# Patient Record
Sex: Female | Born: 1940 | Race: Black or African American | Hispanic: No | Marital: Married | Smoking: Never smoker
Health system: Southern US, Community
[De-identification: ages and names within clinical notes are randomized; demographics above are authoritative.]

## PROBLEM LIST (undated history)

## (undated) DIAGNOSIS — G510 Bell's palsy: Secondary | ICD-10-CM

## (undated) DIAGNOSIS — R7303 Prediabetes: Secondary | ICD-10-CM

## (undated) DIAGNOSIS — F0390 Unspecified dementia without behavioral disturbance: Secondary | ICD-10-CM

## (undated) DIAGNOSIS — K5790 Diverticulosis of intestine, part unspecified, without perforation or abscess without bleeding: Secondary | ICD-10-CM

## (undated) DIAGNOSIS — R42 Dizziness and giddiness: Secondary | ICD-10-CM

## (undated) DIAGNOSIS — E7800 Pure hypercholesterolemia, unspecified: Secondary | ICD-10-CM

## (undated) DIAGNOSIS — R2681 Unsteadiness on feet: Secondary | ICD-10-CM

## (undated) DIAGNOSIS — M1990 Unspecified osteoarthritis, unspecified site: Secondary | ICD-10-CM

## (undated) DIAGNOSIS — C801 Malignant (primary) neoplasm, unspecified: Secondary | ICD-10-CM

## (undated) DIAGNOSIS — R413 Other amnesia: Secondary | ICD-10-CM

## (undated) HISTORY — DX: Pure hypercholesterolemia, unspecified: E78.00

## (undated) HISTORY — DX: Prediabetes: R73.03

## (undated) HISTORY — PX: ABDOMINAL HYSTERECTOMY: SHX81

## (undated) HISTORY — PX: MASTECTOMY: SHX3

## (undated) HISTORY — PX: BREAST SURGERY: SHX581

---

## 1998-05-07 ENCOUNTER — Other Ambulatory Visit: Admission: RE | Admit: 1998-05-07 | Discharge: 1998-05-07 | Payer: Self-pay | Admitting: Family Medicine

## 2000-08-13 ENCOUNTER — Ambulatory Visit (HOSPITAL_COMMUNITY): Admission: RE | Admit: 2000-08-13 | Discharge: 2000-08-13 | Payer: Self-pay | Admitting: Internal Medicine

## 2001-04-22 ENCOUNTER — Encounter (HOSPITAL_COMMUNITY): Admission: RE | Admit: 2001-04-22 | Discharge: 2001-05-22 | Payer: Self-pay | Admitting: Oncology

## 2001-04-22 ENCOUNTER — Encounter: Admission: RE | Admit: 2001-04-22 | Discharge: 2001-04-22 | Payer: Self-pay | Admitting: Oncology

## 2001-06-13 ENCOUNTER — Encounter: Payer: Self-pay | Admitting: Family Medicine

## 2001-06-13 ENCOUNTER — Ambulatory Visit (HOSPITAL_COMMUNITY): Admission: RE | Admit: 2001-06-13 | Discharge: 2001-06-13 | Payer: Self-pay | Admitting: Family Medicine

## 2002-04-12 ENCOUNTER — Other Ambulatory Visit: Admission: RE | Admit: 2002-04-12 | Discharge: 2002-04-12 | Payer: Self-pay | Admitting: Family Medicine

## 2002-04-21 ENCOUNTER — Encounter (HOSPITAL_COMMUNITY): Admission: RE | Admit: 2002-04-21 | Discharge: 2002-05-21 | Payer: Self-pay | Admitting: Oncology

## 2002-04-21 ENCOUNTER — Encounter: Admission: RE | Admit: 2002-04-21 | Discharge: 2002-04-21 | Payer: Self-pay | Admitting: Oncology

## 2002-06-19 ENCOUNTER — Encounter: Payer: Self-pay | Admitting: General Surgery

## 2002-06-19 ENCOUNTER — Ambulatory Visit (HOSPITAL_COMMUNITY): Admission: RE | Admit: 2002-06-19 | Discharge: 2002-06-19 | Payer: Self-pay | Admitting: General Surgery

## 2003-04-23 ENCOUNTER — Encounter (HOSPITAL_COMMUNITY): Admission: RE | Admit: 2003-04-23 | Discharge: 2003-05-23 | Payer: Self-pay | Admitting: Oncology

## 2003-04-23 ENCOUNTER — Encounter: Admission: RE | Admit: 2003-04-23 | Discharge: 2003-04-23 | Payer: Self-pay | Admitting: Oncology

## 2003-06-27 ENCOUNTER — Encounter (HOSPITAL_COMMUNITY): Admission: RE | Admit: 2003-06-27 | Discharge: 2003-07-27 | Payer: Self-pay | Admitting: Oncology

## 2003-06-27 ENCOUNTER — Encounter: Admission: RE | Admit: 2003-06-27 | Discharge: 2003-06-27 | Payer: Self-pay | Admitting: Oncology

## 2004-04-16 ENCOUNTER — Ambulatory Visit (HOSPITAL_COMMUNITY): Admission: RE | Admit: 2004-04-16 | Discharge: 2004-04-16 | Payer: Self-pay | Admitting: General Surgery

## 2004-04-22 ENCOUNTER — Encounter (HOSPITAL_COMMUNITY): Admission: RE | Admit: 2004-04-22 | Discharge: 2004-05-22 | Payer: Self-pay | Admitting: Oncology

## 2004-04-22 ENCOUNTER — Ambulatory Visit (HOSPITAL_COMMUNITY): Payer: Self-pay | Admitting: Oncology

## 2004-04-22 ENCOUNTER — Encounter: Admission: RE | Admit: 2004-04-22 | Discharge: 2004-04-22 | Payer: Self-pay | Admitting: Oncology

## 2004-11-30 ENCOUNTER — Emergency Department (HOSPITAL_COMMUNITY): Admission: EM | Admit: 2004-11-30 | Discharge: 2004-11-30 | Payer: Self-pay | Admitting: Emergency Medicine

## 2005-02-10 ENCOUNTER — Encounter: Admission: RE | Admit: 2005-02-10 | Discharge: 2005-02-10 | Payer: Self-pay | Admitting: Neurology

## 2005-04-17 ENCOUNTER — Ambulatory Visit (HOSPITAL_COMMUNITY): Admission: RE | Admit: 2005-04-17 | Discharge: 2005-04-17 | Payer: Self-pay | Admitting: General Surgery

## 2005-04-21 ENCOUNTER — Encounter: Admission: RE | Admit: 2005-04-21 | Discharge: 2005-04-21 | Payer: Self-pay | Admitting: Oncology

## 2005-04-21 ENCOUNTER — Encounter (HOSPITAL_COMMUNITY): Admission: RE | Admit: 2005-04-21 | Discharge: 2005-05-21 | Payer: Self-pay | Admitting: Oncology

## 2005-04-21 ENCOUNTER — Ambulatory Visit (HOSPITAL_COMMUNITY): Payer: Self-pay | Admitting: Oncology

## 2005-08-18 ENCOUNTER — Encounter (INDEPENDENT_AMBULATORY_CARE_PROVIDER_SITE_OTHER): Payer: Self-pay | Admitting: *Deleted

## 2005-08-18 ENCOUNTER — Ambulatory Visit (HOSPITAL_COMMUNITY): Admission: RE | Admit: 2005-08-18 | Discharge: 2005-08-18 | Payer: Self-pay | Admitting: Internal Medicine

## 2005-08-21 ENCOUNTER — Ambulatory Visit: Payer: Self-pay | Admitting: Internal Medicine

## 2006-04-19 ENCOUNTER — Encounter (HOSPITAL_COMMUNITY): Admission: RE | Admit: 2006-04-19 | Discharge: 2006-05-19 | Payer: Self-pay | Admitting: Oncology

## 2006-04-20 ENCOUNTER — Ambulatory Visit (HOSPITAL_COMMUNITY): Payer: Self-pay | Admitting: Oncology

## 2006-04-28 ENCOUNTER — Encounter: Admission: RE | Admit: 2006-04-28 | Discharge: 2006-04-28 | Payer: Self-pay | Admitting: Oncology

## 2007-04-20 ENCOUNTER — Encounter (HOSPITAL_COMMUNITY): Admission: RE | Admit: 2007-04-20 | Discharge: 2007-05-20 | Payer: Self-pay | Admitting: Oncology

## 2007-04-20 ENCOUNTER — Ambulatory Visit (HOSPITAL_COMMUNITY): Payer: Self-pay | Admitting: Oncology

## 2008-04-18 ENCOUNTER — Encounter (HOSPITAL_COMMUNITY): Admission: RE | Admit: 2008-04-18 | Discharge: 2008-05-18 | Payer: Self-pay | Admitting: Oncology

## 2008-04-18 ENCOUNTER — Ambulatory Visit (HOSPITAL_COMMUNITY): Payer: Self-pay | Admitting: Oncology

## 2008-10-24 ENCOUNTER — Encounter: Payer: Self-pay | Admitting: Internal Medicine

## 2008-10-29 ENCOUNTER — Encounter: Payer: Self-pay | Admitting: Internal Medicine

## 2008-10-29 ENCOUNTER — Ambulatory Visit: Payer: Self-pay | Admitting: Internal Medicine

## 2008-10-29 ENCOUNTER — Ambulatory Visit (HOSPITAL_COMMUNITY): Admission: RE | Admit: 2008-10-29 | Discharge: 2008-10-29 | Payer: Self-pay | Admitting: Internal Medicine

## 2008-10-29 HISTORY — PX: COLONOSCOPY: SHX5424

## 2008-11-02 ENCOUNTER — Encounter: Payer: Self-pay | Admitting: Internal Medicine

## 2008-11-17 ENCOUNTER — Emergency Department (HOSPITAL_COMMUNITY): Admission: EM | Admit: 2008-11-17 | Discharge: 2008-11-17 | Payer: Self-pay | Admitting: Emergency Medicine

## 2009-05-22 ENCOUNTER — Ambulatory Visit (HOSPITAL_COMMUNITY): Payer: Self-pay | Admitting: Oncology

## 2009-05-23 ENCOUNTER — Ambulatory Visit (HOSPITAL_COMMUNITY): Admission: RE | Admit: 2009-05-23 | Discharge: 2009-05-23 | Payer: Self-pay | Admitting: Family Medicine

## 2010-04-27 ENCOUNTER — Encounter (HOSPITAL_COMMUNITY): Payer: Self-pay | Admitting: Oncology

## 2010-05-21 ENCOUNTER — Ambulatory Visit (HOSPITAL_COMMUNITY): Payer: Medicare Other | Admitting: Oncology

## 2010-05-21 DIAGNOSIS — C50919 Malignant neoplasm of unspecified site of unspecified female breast: Secondary | ICD-10-CM

## 2010-05-22 ENCOUNTER — Other Ambulatory Visit (HOSPITAL_COMMUNITY): Payer: Self-pay | Admitting: Oncology

## 2010-05-22 DIAGNOSIS — Z139 Encounter for screening, unspecified: Secondary | ICD-10-CM

## 2010-05-26 ENCOUNTER — Ambulatory Visit (HOSPITAL_COMMUNITY)
Admission: RE | Admit: 2010-05-26 | Discharge: 2010-05-26 | Disposition: A | Discharge status: Home / Self Care | Payer: Medicare Other | Source: Ambulatory Visit | Attending: Oncology | Admitting: Oncology

## 2010-05-26 DIAGNOSIS — Z139 Encounter for screening, unspecified: Secondary | ICD-10-CM

## 2010-05-26 DIAGNOSIS — Z1231 Encounter for screening mammogram for malignant neoplasm of breast: Secondary | ICD-10-CM | POA: Insufficient documentation

## 2010-07-21 LAB — DIFFERENTIAL
Basophils Absolute: 0.1 10*3/uL (ref 0.0–0.1)
Lymphocytes Relative: 28 % (ref 12–46)
Monocytes Absolute: 0.4 10*3/uL (ref 0.1–1.0)
Monocytes Relative: 5 % (ref 3–12)
Neutrophils Relative %: 64 % (ref 43–77)

## 2010-07-21 LAB — CBC
Hemoglobin: 14.8 g/dL (ref 12.0–15.0)
RBC: 4.97 MIL/uL (ref 3.87–5.11)
WBC: 7.6 10*3/uL (ref 4.0–10.5)

## 2010-07-21 LAB — FOLATE: Folate: 14.3 ng/mL

## 2010-07-21 LAB — COMPREHENSIVE METABOLIC PANEL
ALT: 20 U/L (ref 0–35)
BUN: 9 mg/dL (ref 6–23)
CO2: 29 mEq/L (ref 19–32)
Calcium: 9.1 mg/dL (ref 8.4–10.5)
GFR calc Af Amer: >60 mL/min (ref >60)
GFR calc non Af Amer: >60 mL/min (ref >60)
Glucose, Bld: 100 mg/dL — ABNORMAL HIGH (ref 70–99)
Sodium: 140 mEq/L (ref 135–145)
Total Protein: 6.5 g/dL (ref 6.0–8.3)

## 2010-07-21 LAB — SEDIMENTATION RATE: Sed Rate: 9 mm/hr (ref 0–22)

## 2010-07-21 LAB — VITAMIN B12: Vitamin B-12: 408 pg/mL (ref 211–911)

## 2010-07-21 LAB — TSH: TSH: 0.931 u[IU]/mL (ref 0.350–4.500)

## 2010-07-23 ENCOUNTER — Emergency Department (HOSPITAL_COMMUNITY)
Admission: EM | Admit: 2010-07-23 | Discharge: 2010-07-24 | Disposition: A | Discharge status: Home / Self Care | Payer: Medicare Other | Attending: Emergency Medicine | Admitting: Emergency Medicine

## 2010-07-23 DIAGNOSIS — F0390 Unspecified dementia without behavioral disturbance: Secondary | ICD-10-CM | POA: Insufficient documentation

## 2010-07-23 DIAGNOSIS — I10 Essential (primary) hypertension: Secondary | ICD-10-CM | POA: Insufficient documentation

## 2010-07-23 DIAGNOSIS — Z79899 Other long term (current) drug therapy: Secondary | ICD-10-CM | POA: Insufficient documentation

## 2010-07-23 DIAGNOSIS — G40802 Other epilepsy, not intractable, without status epilepticus: Secondary | ICD-10-CM | POA: Insufficient documentation

## 2010-07-23 DIAGNOSIS — Z853 Personal history of malignant neoplasm of breast: Secondary | ICD-10-CM | POA: Insufficient documentation

## 2010-07-23 DIAGNOSIS — G510 Bell's palsy: Secondary | ICD-10-CM | POA: Insufficient documentation

## 2010-08-19 NOTE — Op Note (Signed)
NAMECAMARIA, GERALD            ACCOUNT NO.:  [id number]   MEDICAL RECORD NO.:  [id number]          PATIENT TYPE:  AMB   LOCATION:  DAY                           FACILITY:  [facility]   PHYSICIAN:  R. Roetta Sessions, M.D. DATE OF BIRTH:  [birth year]   DATE OF PROCEDURE:  10/29/2008  DATE OF DISCHARGE:                               OPERATIVE REPORT   INDICATIONS FOR PROCEDURE:  A 70 year old African American lady with a  history of colonic polyps.  She is here for surveillance.  She has no  lower GI tract symptoms currently.  Colonoscopy is now being done.  Risks, benefits and limitations have been reviewed, questions answered.  Please see documentation on medical record.   PROCEDURE NOTE:  O2 saturation, blood pressure, pulse respirations  monitored throughout the entire procedure.  Conscious sedation with  Versed 4 mg IV and Demerol 75 mg IV in divided doses.   INSTRUMENT:  Pentax video chip system.   FINDINGS:  Digital rectal exam revealed no abnormalities.  The prep was  adequate.  Colon:  Colonic mucosa was surveyed from the rectosigmoid  junction to the left transverse and right colon to the upstream of the  cecum.  There was no appendiceal orifice, as seen previously.  Cecum:  The colon had a blind pouch which appeared to be the cecum.  The  ileocecal valve was adjacent to the cecum.  From this level, the scope  was slowly and cautiously withdrawn.  All previously mentioned mucosal  surfaces were again seen.  The patient had a single diminutive polyp at  the base of the cecum, which was cold-biopsied/removed.  The patient  also had scattered left-sided diverticula, and the colonic mucosa  appeared normal.  The scope was pulled out of the rectum.  The rectal  was examined, including rectal mucosa, including retroflex view of the  anal verge demonstrated no abnormalities.  The patient tolerated the  procedure well, was reactive to endoscopy.  Cecal withdrawal time 8  minutes.   IMPRESSION:  Normal examination with some left-sided diverticula.  Diminutive cecal polyp, status post cold biopsy removal.  Right colonic  mucosa appeared normal.   RECOMMENDATIONS:  1. Diverticulosis polyp literature provided to Ms. Morganti  2. Follow up on path.  3. Further recommendations to follow.      Jonathon Bellows, M.D.  Electronically Signed     RMR/MEDQ  D:  10/29/2008  T:  10/29/2008  Job:  [id number]

## 2011-04-28 ENCOUNTER — Other Ambulatory Visit (HOSPITAL_COMMUNITY): Payer: Self-pay | Admitting: Family Medicine

## 2011-04-28 DIAGNOSIS — Z139 Encounter for screening, unspecified: Secondary | ICD-10-CM

## 2011-05-20 ENCOUNTER — Ambulatory Visit (HOSPITAL_COMMUNITY): Payer: Medicare Other | Admitting: Oncology

## 2011-05-29 ENCOUNTER — Ambulatory Visit (HOSPITAL_COMMUNITY)
Admission: RE | Admit: 2011-05-29 | Discharge: 2011-05-29 | Disposition: A | Discharge status: Home / Self Care | Payer: Medicare Other | Source: Ambulatory Visit | Attending: Family Medicine | Admitting: Family Medicine

## 2011-05-29 DIAGNOSIS — Z139 Encounter for screening, unspecified: Secondary | ICD-10-CM

## 2011-05-29 DIAGNOSIS — Z1231 Encounter for screening mammogram for malignant neoplasm of breast: Secondary | ICD-10-CM | POA: Insufficient documentation

## 2011-10-06 DIAGNOSIS — E119 Type 2 diabetes mellitus without complications: Secondary | ICD-10-CM | POA: Diagnosis not present

## 2011-10-06 DIAGNOSIS — R413 Other amnesia: Secondary | ICD-10-CM | POA: Diagnosis not present

## 2011-10-06 DIAGNOSIS — I10 Essential (primary) hypertension: Secondary | ICD-10-CM | POA: Diagnosis not present

## 2011-10-06 DIAGNOSIS — G510 Bell's palsy: Secondary | ICD-10-CM | POA: Diagnosis not present

## 2011-10-06 DIAGNOSIS — E7800 Pure hypercholesterolemia, unspecified: Secondary | ICD-10-CM | POA: Diagnosis not present

## 2011-12-21 DIAGNOSIS — H40059 Ocular hypertension, unspecified eye: Secondary | ICD-10-CM | POA: Diagnosis not present

## 2012-01-28 DIAGNOSIS — Z23 Encounter for immunization: Secondary | ICD-10-CM | POA: Diagnosis not present

## 2012-03-08 DIAGNOSIS — G510 Bell's palsy: Secondary | ICD-10-CM | POA: Diagnosis not present

## 2012-03-08 DIAGNOSIS — E781 Pure hyperglyceridemia: Secondary | ICD-10-CM | POA: Diagnosis not present

## 2012-03-08 DIAGNOSIS — F0390 Unspecified dementia without behavioral disturbance: Secondary | ICD-10-CM | POA: Diagnosis not present

## 2012-03-08 DIAGNOSIS — I10 Essential (primary) hypertension: Secondary | ICD-10-CM | POA: Diagnosis not present

## 2012-03-08 DIAGNOSIS — E7800 Pure hypercholesterolemia, unspecified: Secondary | ICD-10-CM | POA: Diagnosis not present

## 2012-03-08 DIAGNOSIS — E119 Type 2 diabetes mellitus without complications: Secondary | ICD-10-CM | POA: Diagnosis not present

## 2012-04-29 ENCOUNTER — Other Ambulatory Visit (HOSPITAL_COMMUNITY): Payer: Self-pay | Admitting: Family Medicine

## 2012-04-29 DIAGNOSIS — Z139 Encounter for screening, unspecified: Secondary | ICD-10-CM

## 2012-05-30 ENCOUNTER — Ambulatory Visit (HOSPITAL_COMMUNITY)
Admission: RE | Admit: 2012-05-30 | Discharge: 2012-05-30 | Disposition: A | Discharge status: Home / Self Care | Payer: Medicare Other | Source: Ambulatory Visit | Attending: Family Medicine | Admitting: Family Medicine

## 2012-05-30 DIAGNOSIS — Z1231 Encounter for screening mammogram for malignant neoplasm of breast: Secondary | ICD-10-CM | POA: Insufficient documentation

## 2012-05-30 DIAGNOSIS — Z139 Encounter for screening, unspecified: Secondary | ICD-10-CM

## 2012-07-06 DIAGNOSIS — E785 Hyperlipidemia, unspecified: Secondary | ICD-10-CM | POA: Diagnosis not present

## 2012-07-06 DIAGNOSIS — G510 Bell's palsy: Secondary | ICD-10-CM | POA: Diagnosis not present

## 2012-07-06 DIAGNOSIS — E119 Type 2 diabetes mellitus without complications: Secondary | ICD-10-CM | POA: Diagnosis not present

## 2012-07-06 DIAGNOSIS — I10 Essential (primary) hypertension: Secondary | ICD-10-CM | POA: Diagnosis not present

## 2012-07-06 DIAGNOSIS — H53149 Visual discomfort, unspecified: Secondary | ICD-10-CM | POA: Diagnosis not present

## 2012-11-08 DIAGNOSIS — I10 Essential (primary) hypertension: Secondary | ICD-10-CM | POA: Diagnosis not present

## 2012-11-08 DIAGNOSIS — E7800 Pure hypercholesterolemia, unspecified: Secondary | ICD-10-CM | POA: Diagnosis not present

## 2012-12-27 DIAGNOSIS — Z23 Encounter for immunization: Secondary | ICD-10-CM | POA: Diagnosis not present

## 2013-02-09 DIAGNOSIS — H40059 Ocular hypertension, unspecified eye: Secondary | ICD-10-CM | POA: Diagnosis not present

## 2013-03-08 DIAGNOSIS — E119 Type 2 diabetes mellitus without complications: Secondary | ICD-10-CM | POA: Diagnosis not present

## 2013-03-08 DIAGNOSIS — I10 Essential (primary) hypertension: Secondary | ICD-10-CM | POA: Diagnosis not present

## 2013-03-08 DIAGNOSIS — E7800 Pure hypercholesterolemia, unspecified: Secondary | ICD-10-CM | POA: Diagnosis not present

## 2013-03-08 DIAGNOSIS — F0390 Unspecified dementia without behavioral disturbance: Secondary | ICD-10-CM | POA: Diagnosis not present

## 2013-05-03 ENCOUNTER — Other Ambulatory Visit (HOSPITAL_COMMUNITY): Payer: Self-pay | Admitting: Family Medicine

## 2013-05-03 DIAGNOSIS — Z139 Encounter for screening, unspecified: Secondary | ICD-10-CM

## 2013-06-01 ENCOUNTER — Ambulatory Visit (HOSPITAL_COMMUNITY): Payer: Medicare Other

## 2013-06-01 ENCOUNTER — Other Ambulatory Visit (HOSPITAL_COMMUNITY): Payer: Self-pay | Admitting: Family Medicine

## 2013-06-01 DIAGNOSIS — Z1231 Encounter for screening mammogram for malignant neoplasm of breast: Secondary | ICD-10-CM

## 2013-06-05 ENCOUNTER — Ambulatory Visit (HOSPITAL_COMMUNITY): Payer: Medicare Other

## 2013-07-18 ENCOUNTER — Ambulatory Visit (HOSPITAL_COMMUNITY)
Admission: RE | Admit: 2013-07-18 | Discharge: 2013-07-18 | Disposition: A | Discharge status: Home / Self Care | Payer: Medicare Other | Source: Ambulatory Visit | Attending: Family Medicine | Admitting: Family Medicine

## 2013-07-18 DIAGNOSIS — Z1231 Encounter for screening mammogram for malignant neoplasm of breast: Secondary | ICD-10-CM | POA: Insufficient documentation

## 2013-09-22 DIAGNOSIS — E119 Type 2 diabetes mellitus without complications: Secondary | ICD-10-CM | POA: Diagnosis not present

## 2013-09-22 DIAGNOSIS — R413 Other amnesia: Secondary | ICD-10-CM | POA: Diagnosis not present

## 2013-09-22 DIAGNOSIS — I10 Essential (primary) hypertension: Secondary | ICD-10-CM | POA: Diagnosis not present

## 2013-10-10 ENCOUNTER — Encounter: Payer: Self-pay | Admitting: Internal Medicine

## 2013-11-04 DIAGNOSIS — I10 Essential (primary) hypertension: Secondary | ICD-10-CM | POA: Diagnosis not present

## 2013-11-04 DIAGNOSIS — H8309 Labyrinthitis, unspecified ear: Secondary | ICD-10-CM | POA: Diagnosis not present

## 2013-11-04 DIAGNOSIS — R413 Other amnesia: Secondary | ICD-10-CM | POA: Diagnosis not present

## 2013-11-08 DIAGNOSIS — H5310 Unspecified subjective visual disturbances: Secondary | ICD-10-CM | POA: Diagnosis not present

## 2013-11-10 ENCOUNTER — Emergency Department (HOSPITAL_COMMUNITY): Payer: Medicare Other

## 2013-11-10 ENCOUNTER — Encounter (HOSPITAL_COMMUNITY): Payer: Self-pay | Admitting: Emergency Medicine

## 2013-11-10 ENCOUNTER — Emergency Department (HOSPITAL_COMMUNITY)
Admission: EM | Admit: 2013-11-10 | Discharge: 2013-11-10 | Disposition: A | Discharge status: Home / Self Care | Payer: Medicare Other | Attending: Emergency Medicine | Admitting: Emergency Medicine

## 2013-11-10 DIAGNOSIS — Z79899 Other long term (current) drug therapy: Secondary | ICD-10-CM | POA: Diagnosis not present

## 2013-11-10 DIAGNOSIS — R42 Dizziness and giddiness: Secondary | ICD-10-CM | POA: Insufficient documentation

## 2013-11-10 DIAGNOSIS — Z8669 Personal history of other diseases of the nervous system and sense organs: Secondary | ICD-10-CM | POA: Insufficient documentation

## 2013-11-10 DIAGNOSIS — M129 Arthropathy, unspecified: Secondary | ICD-10-CM | POA: Insufficient documentation

## 2013-11-10 HISTORY — DX: Bell's palsy: G51.0

## 2013-11-10 HISTORY — DX: Unspecified osteoarthritis, unspecified site: M19.90

## 2013-11-10 LAB — COMPREHENSIVE METABOLIC PANEL
ALT: 20 U/L (ref 0–35)
AST: 26 U/L (ref 0–37)
Albumin: 3.9 g/dL (ref 3.5–5.2)
Alkaline Phosphatase: 61 U/L (ref 39–117)
Anion gap: 12 (ref 5–15)
BILIRUBIN TOTAL: 0.5 mg/dL (ref 0.3–1.2)
BUN: 9 mg/dL (ref 6–23)
CALCIUM: 9.4 mg/dL (ref 8.4–10.5)
CHLORIDE: 102 meq/L (ref 96–112)
CO2: 28 mEq/L (ref 19–32)
Creatinine, Ser: 0.84 mg/dL (ref 0.50–1.10)
GFR calc Af Amer: 78 mL/min — ABNORMAL LOW (ref >90)
GFR calc non Af Amer: 67 mL/min — ABNORMAL LOW (ref >90)
Glucose, Bld: 115 mg/dL — ABNORMAL HIGH (ref 70–99)
Potassium: 4.3 mEq/L (ref 3.7–5.3)
Sodium: 142 mEq/L (ref 137–147)
Total Protein: 7.5 g/dL (ref 6.0–8.3)

## 2013-11-10 LAB — DIFFERENTIAL
BASOS ABS: 0 10*3/uL (ref 0.0–0.1)
BASOS PCT: 0 % (ref 0–1)
Eosinophils Absolute: 0.1 10*3/uL (ref 0.0–0.7)
Eosinophils Relative: 2 % (ref 0–5)
Lymphocytes Relative: 37 % (ref 12–46)
Lymphs Abs: 2.3 10*3/uL (ref 0.7–4.0)
Monocytes Absolute: 0.4 10*3/uL (ref 0.1–1.0)
Monocytes Relative: 6 % (ref 3–12)
NEUTROS ABS: 3.4 10*3/uL (ref 1.7–7.7)
Neutrophils Relative %: 55 % (ref 43–77)

## 2013-11-10 LAB — RAPID URINE DRUG SCREEN, HOSP PERFORMED
Amphetamines: NOT DETECTED
BENZODIAZEPINES: NOT DETECTED
Barbiturates: NOT DETECTED
COCAINE: NOT DETECTED
OPIATES: NOT DETECTED
TETRAHYDROCANNABINOL: NOT DETECTED

## 2013-11-10 LAB — URINALYSIS, ROUTINE W REFLEX MICROSCOPIC
BILIRUBIN URINE: NEGATIVE
GLUCOSE, UA: NEGATIVE mg/dL
HGB URINE DIPSTICK: NEGATIVE
KETONES UR: 15 mg/dL — AB
Leukocytes, UA: NEGATIVE
Nitrite: NEGATIVE
PROTEIN: NEGATIVE mg/dL
Specific Gravity, Urine: 1.01 (ref 1.005–1.030)
Urobilinogen, UA: 0.2 mg/dL (ref 0.0–1.0)
pH: 6.5 (ref 5.0–8.0)

## 2013-11-10 LAB — PROTIME-INR
INR: 1 (ref 0.00–1.49)
Prothrombin Time: 13.2 seconds (ref 11.6–15.2)

## 2013-11-10 LAB — CBC
HEMATOCRIT: 47.4 % — AB (ref 36.0–46.0)
Hemoglobin: 16.2 g/dL — ABNORMAL HIGH (ref 12.0–15.0)
MCH: 29.4 pg (ref 26.0–34.0)
MCHC: 34.2 g/dL (ref 30.0–36.0)
MCV: 86 fL (ref 78.0–100.0)
Platelets: 207 10*3/uL (ref 150–400)
RBC: 5.51 MIL/uL — ABNORMAL HIGH (ref 3.87–5.11)
RDW: 14.1 % (ref 11.5–15.5)
WBC: 6.2 10*3/uL (ref 4.0–10.5)

## 2013-11-10 LAB — APTT: APTT: 32 s (ref 24–37)

## 2013-11-10 LAB — TROPONIN I: Troponin I: <0.3 ng/mL (ref <0.30)

## 2013-11-10 LAB — ETHANOL

## 2013-11-10 NOTE — ED Notes (Signed)
Pt reports dizziness has improved slightly but she is still "woozy".

## 2013-11-10 NOTE — ED Notes (Addendum)
Pt ambulated to restroom. Pt steady on feet

## 2013-11-10 NOTE — Discharge Instructions (Signed)

## 2013-11-10 NOTE — ED Notes (Signed)
Pt ambulated to bathroom without difficulty.

## 2013-11-10 NOTE — ED Provider Notes (Signed)
CSN: [id number]     Arrival date & time 11/10/13  1516 History   First MD Initiated Contact with Patient 11/10/13 1541     Chief Complaint  Patient presents with  . Dizziness     (Consider location/radiation/quality/duration/timing/severity/associated sxs/prior Treatment) HPI  Tonya Cortez is a 73 y.o. female who was with her husband today, riding in a car, when she felt cold. Later, after the home. He offered her some lunch, but she did not want to eat. Following that she started crying. Tonight she told her husband that her head hurts. She arrived here, tearful, and stating that she "felt bad" but did not know what was wrong. She had nose congestion, and sneezing, yesterday. There's been no documented fever. She denies cough, shortness of breath, chest pain, paresthesias, nausea or vomiting. On arrival, she was crying and unable to speak. However, after a brief period of reassurance, she was able to speak in gave me most of the history. She denies depressive symptoms. She denies any significant stress. Her husband works as an [employer] manager. He, states that she has been having some memory loss for the last several years. She has a history of Bell's palsy, and vertigo. There are no other known modifying factors.   Past Medical History  Diagnosis Date  . Bell's palsy   . Arthritis    Past Surgical History  Procedure Laterality Date  . Abdominal hysterectomy    . Breast surgery    . Mastectomy Left    History reviewed. No pertinent family history. History  Substance Use Topics  . Smoking status: Never Smoker   . Smokeless tobacco: Not on file  . Alcohol Use: No   OB History   Grav Para Term Preterm Abortions TAB SAB Ect Mult Living                 Review of Systems  All other systems reviewed and are negative.     Allergies  Review of patient's allergies indicates no known allergies.  Home Medications   Prior to Admission medications   Medication Sig Start  Date End Date Taking? Authorizing Provider  Chlorphen-Pseudoephed-Aspirin (EFFERVESCENT COLD RELIEF PO) Take 1 tablet by mouth once as needed (for congestion/cold symptoms).   Yes Historical Provider, MD  donepezil (ARICEPT) 5 MG tablet Take 5 mg by mouth every morning.   Yes Historical Provider, MD  lansoprazole (PREVACID) 15 MG capsule Take 15 mg by mouth every morning.   Yes Historical Provider, MD  Memantine HCl-Donepezil HCl (NAMZARIC) 28-10 MG CP24 Take 1 capsule by mouth every morning.   Yes Historical Provider, MD  oxymetazoline (NASAL SPRAY EXTRA MOISTURIZING) 0.05 % nasal spray Place 1 spray into both nostrils 2 (two) times daily as needed for congestion.   Yes Historical Provider, MD  potassium chloride SA (K-DUR,KLOR-CON) 20 MEQ tablet Take 20 mEq by mouth daily.   Yes Historical Provider, MD  rosuvastatin (CRESTOR) 20 MG tablet Take 20 mg by mouth daily.   Yes Historical Provider, MD   BP 130/75  Pulse 69  Temp(Src) 98.7 F (37.1 C) (Oral)  Resp 15  Ht 5' 1.5" (1.562 m)  Wt 150 lb (68.04 kg)  BMI 27.89 kg/m2  SpO2 97% Physical Exam  Nursing note and vitals reviewed. Constitutional: She is oriented to person, place, and time. She appears well-developed and well-nourished.  HENT:  Head: Normocephalic and atraumatic.  Eyes: Conjunctivae and EOM are normal. Pupils are equal, round, and reactive to light.  Neck: Normal range of motion and phonation normal. Neck supple.  Cardiovascular: Normal rate, regular rhythm and intact distal pulses.   Pulmonary/Chest: Effort normal and breath sounds normal. She exhibits no tenderness.  Abdominal: Soft. She exhibits no distension. There is no tenderness. There is no guarding.  Genitourinary:  Findings:  there is thin, dark stool in rectal vault.  Musculoskeletal: Normal range of motion.  Neurological: She is alert and oriented to person, place, and time. No cranial nerve deficit. She exhibits normal muscle tone.  No dysarthria, aphasia or  nystagmus. Normal finger to nose, and heel-to-shin, bilaterally  Skin: Skin is warm and dry.  Psychiatric: Her behavior is normal. Judgment and thought content normal.  She appears depressed and is tearful    ED Course  Procedures (including critical care time)  Medications - No data to display  Patient Vitals for the past 24 hrs:  BP Temp Temp src Pulse Resp SpO2 Height Weight  11/10/13 1841 130/75 mmHg - - 69 15 97 % - -  11/10/13 1653 155/83 mmHg - - 71 16 97 % - -  11/10/13 1631 - 98.7 F (37.1 C) - - - - - -  11/10/13 1600 156/79 mmHg - - 63 9 100 % - -  11/10/13 1520 169/87 mmHg 98 F (36.7 C) Oral 70 20 100 % 5' 1.5" (1.562 m) 150 lb (68.04 kg)    7:31 PM Reevaluation with update and discussion. After initial assessment and treatment, an updated evaluation reveals she is alert, cheerful, cooperative. She denies dizziness at this time. Family members with her state that she is Aricept and Namzaric, the latter, which has a component, Aricept. So, that she is mistakenly taking too much Aricept. Findings discussed with patient and family members, all questions answered.Daleen Bo L      Date: 11/10/13- MUSE hyperlink inactive  Rate: 55  Rhythm: normal sinus rhythm  QRS Axis: normal  PR and QT Intervals: normal  ST/T Wave abnormalities: normal  PR and QRS Conduction Disutrbances:none  Narrative Interpretation:   Old EKG Reviewed: none available   Labs Review Labs Reviewed  CBC - Abnormal; Notable for the following:    RBC 5.51 (*)    Hemoglobin 16.2 (*)    HCT 47.4 (*)    All other components within normal limits  COMPREHENSIVE METABOLIC PANEL - Abnormal; Notable for the following:    Glucose, Bld 115 (*)    GFR calc non Af Amer 67 (*)    GFR calc Af Amer 78 (*)    All other components within normal limits  URINALYSIS, ROUTINE W REFLEX MICROSCOPIC - Abnormal; Notable for the following:    Ketones, ur 15 (*)    All other components within normal limits  ETHANOL   PROTIME-INR  APTT  DIFFERENTIAL  URINE RAPID DRUG SCREEN (HOSP PERFORMED)  TROPONIN I    Imaging Review Ct Head Wo Contrast  11/10/2013   CLINICAL DATA:  73 year old female with dizziness.  EXAM: CT HEAD WITHOUT CONTRAST  TECHNIQUE: Contiguous axial images were obtained from the base of the skull through the vertex without intravenous contrast.  COMPARISON:  11/30/2004 head CT and 02/10/2005 brain MR  FINDINGS: Mild generalized cerebral volume loss is noted.  No acute intracranial abnormalities are identified, including mass lesion or mass effect, hydrocephalus, extra-axial fluid collection, midline shift, hemorrhage, or acute infarction.  The visualized bony calvarium is unremarkable.  IMPRESSION: Mild generalized cerebral volume loss without acute or other significant abnormality.   Electronically Signed  By: Hassan Rowan M.D.   On: 11/10/2013 16:45     EKG Interpretation None      MDM   Final diagnoses:  Dizziness    Nonspecific dizziness, spontaneously, improved. She has a history of vertigo. There is no evidence for acute CVA. She may be taking too much Aricept, which could potentially cause some dizziness. No evidence for serious bacterial infection. Metabolic instability or suggested acute CNS abnormality.  Nursing Notes Reviewed/ Care Coordinated Applicable Imaging Reviewed Interpretation of Laboratory Data incorporated into ED treatment  The patient appears reasonably screened and/or stabilized for discharge and I doubt any other medical condition or other [facility] requiring further screening, evaluation, or treatment in the ED at this time prior to discharge.  Plan: Home Medications- take Aricept as a combined tablet only; Home Treatments- rest; return here if the recommended treatment, does not improve the symptoms; Recommended follow up- PCP. Followup, one week, for checkup     Richarda Blade, MD 11/10/13 [phone]

## 2013-11-10 NOTE — ED Notes (Signed)
Started with dizziness last week.  Went to family Dr last week and changed medication for her memory.

## 2013-11-28 DIAGNOSIS — F0390 Unspecified dementia without behavioral disturbance: Secondary | ICD-10-CM | POA: Diagnosis not present

## 2013-11-28 DIAGNOSIS — H8309 Labyrinthitis, unspecified ear: Secondary | ICD-10-CM | POA: Diagnosis not present

## 2013-11-28 DIAGNOSIS — I10 Essential (primary) hypertension: Secondary | ICD-10-CM | POA: Diagnosis not present

## 2013-11-28 DIAGNOSIS — E7800 Pure hypercholesterolemia, unspecified: Secondary | ICD-10-CM | POA: Diagnosis not present

## 2013-12-01 ENCOUNTER — Telehealth: Payer: Self-pay

## 2013-12-01 NOTE — Telephone Encounter (Signed)
Pt was referred by Dr. Criss Rosales for colonoscopy. Her last one was 10/29/2008 by Dr. Gala Romney. Hx of adenomatous polyps and needs ov prior to colonoscopy.

## 2013-12-04 NOTE — Telephone Encounter (Signed)
Pt is scheduled for OV with Laban Emperor, NP on 01/02/2014 at 1:30 PM.

## 2014-01-02 ENCOUNTER — Encounter (HOSPITAL_COMMUNITY): Payer: Self-pay | Admitting: Pharmacy Technician

## 2014-01-02 ENCOUNTER — Ambulatory Visit (INDEPENDENT_AMBULATORY_CARE_PROVIDER_SITE_OTHER): Payer: Medicare Other | Admitting: Gastroenterology

## 2014-01-02 ENCOUNTER — Encounter: Payer: Self-pay | Admitting: Gastroenterology

## 2014-01-02 ENCOUNTER — Other Ambulatory Visit: Payer: Self-pay

## 2014-01-02 VITALS — BP 144/90 | HR 70 | Temp 98.5°F | Ht 61.5 in | Wt 159.2 lb

## 2014-01-02 DIAGNOSIS — D369 Benign neoplasm, unspecified site: Secondary | ICD-10-CM | POA: Diagnosis not present

## 2014-01-02 DIAGNOSIS — Z1211 Encounter for screening for malignant neoplasm of colon: Secondary | ICD-10-CM

## 2014-01-02 MED ORDER — PEG 3350-KCL-NA BICARB-NACL 420 G PO SOLR: 4000.0000 mL | ORAL | Status: DC

## 2014-01-02 NOTE — Assessment & Plan Note (Signed)
73 year old female with need for surveillance colonoscopy now; last colonoscopy in 2010 by Dr. Gala Romney. No concerning upper or lower GI symptoms.   Proceed with TCS with Dr. Gala Romney in near future: the risks, benefits, and alternatives have been discussed with the patient in detail. The patient states understanding and desires to proceed.

## 2014-01-02 NOTE — Progress Notes (Signed)
Primary Care Physician:  Elyn Peers, MD Primary Gastroenterologist:  Dr. Gala Romney   Chief Complaint  Patient presents with  . Colonoscopy    HPI:   Tonya Cortez presents today to schedule a surveillance colonoscopy due to history of adenomatous polyps. Last colonoscopy in 2010. No overt signs of GI bleeding. No abdominal pain. No constipation, diarrhea. Prevacid for GERD, no dysphagia. No concerning GI symptoms.   Past Medical History  Diagnosis Date  . Bell's palsy   . Arthritis   . Hypercholesterolemia   . Borderline diabetes     Past Surgical History  Procedure Laterality Date  . Abdominal hysterectomy    . Breast surgery    . Mastectomy Left     abnormal mass in breast, non-cancerous per patient.   . Colonoscopy  10/29/2008    WPY:[id number] examination with some left-sided diverticula/Diminutive cecal polyp, status post cold biopsy removal.  Right colonic mucosa appeared normal    Current Outpatient Prescriptions  Medication Sig Dispense Refill  . lansoprazole (PREVACID) 15 MG capsule Take 15 mg by mouth every morning.      . meclizine (ANTIVERT) 25 MG tablet Take 25 mg by mouth 3 (three) times daily.      . Memantine HCl-Donepezil HCl (NAMZARIC) 28-10 MG CP24 Take 1 capsule by mouth every morning.      . potassium chloride SA (K-DUR,KLOR-CON) 20 MEQ tablet Take 20 mEq by mouth daily.      . rosuvastatin (CRESTOR) 20 MG tablet Take 20 mg by mouth daily.       No current facility-administered medications for this visit.    Allergies as of 01/02/2014  . (No Known Allergies)    Family History  Problem Relation Age of Onset  . Colon cancer Neg Hx     History   Social History  . Marital Status: Married    Spouse Name: N/A    Number of Children: N/A  . Years of Education: N/A   Occupational History  . Not on file.   Social History Main Topics  . Smoking status: Never Smoker   . Smokeless tobacco: Not on file  . Alcohol Use: No  . Drug Use:  Not on file  . Sexual Activity: Not on file   Other Topics Concern  . Not on file   Social History Narrative  . No narrative on file    Review of Systems: Gen: see HPI CV: occasional palpitations Resp: Denies shortness of breath at rest or with exertion. Denies wheezing or cough.  GI: see HPI GU : Denies urinary burning, urinary frequency, urinary hesitancy MS: left knee pain Derm: Denies rash, itching, dry skin Psych: short-term memory Heme: Denies bruising, bleeding, and enlarged lymph nodes.  Physical Exam: BP 144/90  Pulse 70  Temp(Src) 98.5 F (36.9 C) (Oral)  Ht 5' 1.5" (1.562 m)  Wt 159 lb 3.2 oz (72.213 kg)  BMI 29.60 kg/m2 General:   Alert and oriented. Pleasant and cooperative. Well-nourished and well-developed.  Head:  Normocephalic and atraumatic. Eyes:  Without icterus, sclera clear and conjunctiva pink.  Ears:  Normal auditory acuity. Nose:  No deformity, discharge,  or lesions. Mouth:  No deformity or lesions, oral mucosa pink.  Lungs:  Clear to auscultation bilaterally. No wheezes, rales, or rhonchi. No distress.  Heart:  S1, S2 present without murmurs appreciated.  Abdomen:  +BS, soft, non-tender and non-distended. No HSM noted. No guarding or rebound. No masses appreciated.  Rectal:  Deferred  Msk:  Symmetrical without gross deformities. Normal posture. Extremities:  Without clubbing or edema. Neurologic:  Alert and  oriented x4;  grossly normal neurologically. Skin:  Intact without significant lesions or rashes. Psych:  Alert and cooperative. Normal mood and affect.

## 2014-01-02 NOTE — Progress Notes (Signed)
cc'ed to pcp °

## 2014-01-02 NOTE — Patient Instructions (Signed)
We have scheduled you for a colonoscopy with Dr. Rourk in the near future.  Further recommendations to follow!   

## 2014-01-10 DIAGNOSIS — Z0000 Encounter for general adult medical examination without abnormal findings: Secondary | ICD-10-CM | POA: Diagnosis not present

## 2014-01-10 DIAGNOSIS — Z23 Encounter for immunization: Secondary | ICD-10-CM | POA: Diagnosis not present

## 2014-01-17 ENCOUNTER — Encounter (HOSPITAL_COMMUNITY)
Admission: RE | Disposition: A | Discharge status: Home / Self Care | Payer: Self-pay | Source: Ambulatory Visit | Attending: Internal Medicine

## 2014-01-17 ENCOUNTER — Encounter (HOSPITAL_COMMUNITY): Payer: Self-pay | Admitting: *Deleted

## 2014-01-17 ENCOUNTER — Ambulatory Visit (HOSPITAL_COMMUNITY)
Admission: RE | Admit: 2014-01-17 | Discharge: 2014-01-17 | Disposition: A | Discharge status: Home / Self Care | Payer: Medicare Other | Source: Ambulatory Visit | Attending: Internal Medicine | Admitting: Internal Medicine

## 2014-01-17 DIAGNOSIS — K219 Gastro-esophageal reflux disease without esophagitis: Secondary | ICD-10-CM | POA: Diagnosis not present

## 2014-01-17 DIAGNOSIS — D120 Benign neoplasm of cecum: Secondary | ICD-10-CM | POA: Diagnosis not present

## 2014-01-17 DIAGNOSIS — E780 Pure hypercholesterolemia: Secondary | ICD-10-CM | POA: Insufficient documentation

## 2014-01-17 DIAGNOSIS — D124 Benign neoplasm of descending colon: Secondary | ICD-10-CM | POA: Diagnosis not present

## 2014-01-17 DIAGNOSIS — Z86010 Personal history of colonic polyps: Secondary | ICD-10-CM | POA: Insufficient documentation

## 2014-01-17 DIAGNOSIS — K5730 Diverticulosis of large intestine without perforation or abscess without bleeding: Secondary | ICD-10-CM | POA: Diagnosis not present

## 2014-01-17 DIAGNOSIS — Z1211 Encounter for screening for malignant neoplasm of colon: Secondary | ICD-10-CM | POA: Insufficient documentation

## 2014-01-17 DIAGNOSIS — D122 Benign neoplasm of ascending colon: Secondary | ICD-10-CM | POA: Diagnosis not present

## 2014-01-17 HISTORY — PX: COLONOSCOPY: SHX5424

## 2014-01-17 SURGERY — COLONOSCOPY
Anesthesia: Moderate Sedation

## 2014-01-17 MED ORDER — ONDANSETRON HCL 4 MG/2ML IJ SOLN
INTRAMUSCULAR | Status: DC | PRN
  Administered 2014-01-17: 4 mg via INTRAVENOUS

## 2014-01-17 MED ORDER — SODIUM CHLORIDE 0.9 % IV SOLN
INTRAVENOUS | Status: DC
  Administered 2014-01-17: 11:00:00 via INTRAVENOUS

## 2014-01-17 MED ORDER — SIMETHICONE 40 MG/0.6ML PO SUSP
ORAL | Status: DC | PRN
  Administered 2014-01-17: 12:00:00

## 2014-01-17 MED ORDER — MIDAZOLAM HCL 5 MG/5ML IJ SOLN
INTRAMUSCULAR | Status: DC | PRN
  Administered 2014-01-17: 1 mg via INTRAVENOUS
  Administered 2014-01-17: 2 mg via INTRAVENOUS
  Administered 2014-01-17: 1 mg via INTRAVENOUS

## 2014-01-17 MED ORDER — MIDAZOLAM HCL 5 MG/5ML IJ SOLN
INTRAMUSCULAR | Status: AC
  Filled 2014-01-17: qty 10

## 2014-01-17 MED ORDER — MEPERIDINE HCL 100 MG/ML IJ SOLN
INTRAMUSCULAR | Status: DC | PRN
  Administered 2014-01-17: 50 mg via INTRAVENOUS

## 2014-01-17 MED ORDER — MEPERIDINE HCL 100 MG/ML IJ SOLN
INTRAMUSCULAR | Status: DC
Start: 2014-01-17 — End: 2014-01-17
  Filled 2014-01-17: qty 2

## 2014-01-17 MED ORDER — ONDANSETRON HCL 4 MG/2ML IJ SOLN
INTRAMUSCULAR | Status: AC
  Filled 2014-01-17: qty 2

## 2014-01-17 NOTE — Discharge Instructions (Signed)
Colonoscopy Discharge Instructions  Read the instructions outlined below and refer to this sheet in the next few weeks. These discharge instructions provide you with general information on caring for yourself after you leave the hospital. Your doctor may also give you specific instructions. While your treatment has been planned according to the most current medical practices available, unavoidable complications occasionally occur. If you have any problems or questions after discharge, call Dr. Gala Romney at [phone]. ACTIVITY  You may resume your regular activity, but move at a slower pace for the next 24 hours.   Take frequent rest periods for the next 24 hours.   Walking will help get rid of the air and reduce the bloated feeling in your belly (abdomen).   No driving for 24 hours (because of the medicine (anesthesia) used during the test).    Do not sign any important legal documents or operate any machinery for 24 hours (because of the anesthesia used during the test).  NUTRITION  Drink plenty of fluids.   You may resume your normal diet as instructed by your doctor.   Begin with a light meal and progress to your normal diet. Heavy or fried foods are harder to digest and may make you feel sick to your stomach (nauseated).   Avoid alcoholic beverages for 24 hours or as instructed.  MEDICATIONS  You may resume your normal medications unless your doctor tells you otherwise.  WHAT YOU CAN EXPECT TODAY  Some feelings of bloating in the abdomen.   Passage of more gas than usual.   Spotting of blood in your stool or on the toilet paper.  IF YOU HAD POLYPS REMOVED DURING THE COLONOSCOPY:  No aspirin products for 7 days or as instructed.   No alcohol for 7 days or as instructed.   Eat a soft diet for the next 24 hours.  FINDING OUT THE RESULTS OF YOUR TEST Not all test results are available during your visit. If your test results are not back during the visit, make an appointment  with your caregiver to find out the results. Do not assume everything is normal if you have not heard from your caregiver or the medical facility. It is important for you to follow up on all of your test results.  SEEK IMMEDIATE MEDICAL ATTENTION IF:  You have more than a spotting of blood in your stool.   Your belly is swollen (abdominal distention).   You are nauseated or vomiting.   You have a temperature over 101.   You have abdominal pain or discomfort that is severe or gets worse throughout the day.   \   Polyp and diverticulosis information provided  Further recommendations to follow pending review of pathology   Colon Polyps Polyps are lumps of extra tissue growing inside the body. Polyps can grow in the large intestine (colon). Most colon polyps are noncancerous (benign). However, some colon polyps can become cancerous over time. Polyps that are larger than a pea may be harmful. To be safe, caregivers remove and test all polyps. CAUSES  Polyps form when mutations in the genes cause your cells to grow and divide even though no more tissue is needed. RISK FACTORS There are a number of risk factors that can increase your chances of getting colon polyps. They include:  Being older than 50 years.  Family history of colon polyps or colon cancer.  Long-term colon diseases, such as colitis or Crohn disease.  Being overweight.  Smoking.  Being inactive.  Drinking  too much alcohol. SYMPTOMS  Most small polyps do not cause symptoms. If symptoms are present, they may include:  Blood in the stool. The stool may look dark red or black.  Constipation or diarrhea that lasts longer than 1 week. DIAGNOSIS People often do not know they have polyps until their caregiver finds them during a regular checkup. Your caregiver can use 4 tests to check for polyps:  Digital rectal exam. The caregiver wears gloves and feels inside the rectum. This test would find polyps only in the  rectum.  Barium enema. The caregiver puts a liquid called barium into your rectum before taking X-rays of your colon. Barium makes your colon look white. Polyps are dark, so they are easy to see in the X-ray pictures.  Sigmoidoscopy. A thin, flexible tube (sigmoidoscope) is placed into your rectum. The sigmoidoscope has a light and tiny camera in it. The caregiver uses the sigmoidoscope to look at the last third of your colon.  Colonoscopy. This test is like sigmoidoscopy, but the caregiver looks at the entire colon. This is the most common method for finding and removing polyps. TREATMENT  Any polyps will be removed during a sigmoidoscopy or colonoscopy. The polyps are then tested for cancer. PREVENTION  To help lower your risk of getting more colon polyps:  Eat plenty of fruits and vegetables. Avoid eating fatty foods.  Do not smoke.  Avoid drinking alcohol.  Exercise every day.  Lose weight if recommended by your caregiver.  Eat plenty of calcium and folate. Foods that are rich in calcium include milk, cheese, and broccoli. Foods that are rich in folate include chickpeas, kidney beans, and spinach. HOME CARE INSTRUCTIONS Keep all follow-up appointments as directed by your caregiver. You may need periodic exams to check for polyps. SEEK MEDICAL CARE IF: You notice bleeding during a bowel movement. Document Released: 12/18/2003 Document Revised: 06/15/2011 Document Reviewed: 06/02/2011 [facility] Patient Information 2015 [place], [place]. This information is not intended to replace advice given to you by your health care provider. Make sure you discuss any questions you have with your health care provider.   Diverticulosis Diverticulosis is the condition that develops when small pouches (diverticula) form in the wall of your colon. Your colon, or large intestine, is where water is absorbed and stool is formed. The pouches form when the inside layer of your colon pushes through weak  spots in the outer layers of your colon. CAUSES  No one knows exactly what causes diverticulosis. RISK FACTORS  Being older than 7. Your risk for this condition increases with age. Diverticulosis is rare in people younger than 40 years. By age 52, almost everyone has it.  Eating a low-fiber diet.  Being frequently constipated.  Being overweight.  Not getting enough exercise.  Smoking.  Taking over-the-counter pain medicines, like aspirin and ibuprofen. SYMPTOMS  Most people with diverticulosis do not have symptoms. DIAGNOSIS  Because diverticulosis often has no symptoms, health care providers often discover the condition during an exam for other colon problems. In many cases, a health care provider will diagnose diverticulosis while using a flexible scope to examine the colon (colonoscopy). TREATMENT  If you have never developed an infection related to diverticulosis, you may not need treatment. If you have had an infection before, treatment may include:  Eating more fruits, vegetables, and grains.  Taking a fiber supplement.  Taking a live bacteria supplement (probiotic).  Taking medicine to relax your colon. HOME CARE INSTRUCTIONS   Drink at least 6-8 glasses  of water each day to prevent constipation.  Try not to strain when you have a bowel movement.  Keep all follow-up appointments. If you have had an infection before:  Increase the fiber in your diet as directed by your health care provider or dietitian.  Take a dietary fiber supplement if your health care provider approves.  Only take medicines as directed by your health care provider. SEEK MEDICAL CARE IF:   You have abdominal pain.  You have bloating.  You have cramps.  You have not gone to the bathroom in 3 days. SEEK IMMEDIATE MEDICAL CARE IF:   Your pain gets worse.  Yourbloating becomes very bad.  You have a fever or chills, and your symptoms suddenly get worse.  You begin vomiting.  You  have bowel movements that are bloody or black. MAKE SURE YOU:  Understand these instructions.  Will watch your condition.  Will get help right away if you are not doing well or get worse. Document Released: 12/19/2003 Document Revised: 03/28/2013 Document Reviewed: 02/15/2013 [facility] Patient Information 2015 [place], [place]. This information is not intended to replace advice given to you by your health care provider. Make sure you discuss any questions you have with your health care provider.

## 2014-01-17 NOTE — H&P (View-Only) (Signed)
Primary Care Physician:  Elyn Peers, MD Primary Gastroenterologist:  Dr. Gala Romney   Chief Complaint  Patient presents with  . Colonoscopy    HPI:   Tonya Cortez presents today to schedule a surveillance colonoscopy due to history of adenomatous polyps. Last colonoscopy in 2010. No overt signs of GI bleeding. No abdominal pain. No constipation, diarrhea. Prevacid for GERD, no dysphagia. No concerning GI symptoms.   Past Medical History  Diagnosis Date  . Bell's palsy   . Arthritis   . Hypercholesterolemia   . Borderline diabetes     Past Surgical History  Procedure Laterality Date  . Abdominal hysterectomy    . Breast surgery    . Mastectomy Left     abnormal mass in breast, non-cancerous per patient.   . Colonoscopy  10/29/2008    [id number] examination with some left-sided diverticula/Diminutive cecal polyp, status post cold biopsy removal.  Right colonic mucosa appeared normal    Current Outpatient Prescriptions  Medication Sig Dispense Refill  . lansoprazole (PREVACID) 15 MG capsule Take 15 mg by mouth every morning.      . meclizine (ANTIVERT) 25 MG tablet Take 25 mg by mouth 3 (three) times daily.      . Memantine HCl-Donepezil HCl (NAMZARIC) 28-10 MG CP24 Take 1 capsule by mouth every morning.      . potassium chloride SA (K-DUR,KLOR-CON) 20 MEQ tablet Take 20 mEq by mouth daily.      . rosuvastatin (CRESTOR) 20 MG tablet Take 20 mg by mouth daily.       No current facility-administered medications for this visit.    Allergies as of 01/02/2014  . (No Known Allergies)    Family History  Problem Relation Age of Onset  . Colon cancer Neg Hx     History   Social History  . Marital Status: Married    Spouse Name: N/A    Number of Children: N/A  . Years of Education: N/A   Occupational History  . Not on file.   Social History Main Topics  . Smoking status: Never Smoker   . Smokeless tobacco: Not on file  . Alcohol Use: No  . Drug Use:  Not on file  . Sexual Activity: Not on file   Other Topics Concern  . Not on file   Social History Narrative  . No narrative on file    Review of Systems: Gen: see HPI CV: occasional palpitations Resp: Denies shortness of breath at rest or with exertion. Denies wheezing or cough.  GI: see HPI GU : Denies urinary burning, urinary frequency, urinary hesitancy MS: left knee pain Derm: Denies rash, itching, dry skin Psych: short-term memory Heme: Denies bruising, bleeding, and enlarged lymph nodes.  Physical Exam: BP 144/90  Pulse 70  Temp(Src) 98.5 F (36.9 C) (Oral)  Ht 5' 1.5" (1.562 m)  Wt 159 lb 3.2 oz (72.213 kg)  BMI 29.60 kg/m2 General:   Alert and oriented. Pleasant and cooperative. Well-nourished and well-developed.  Head:  Normocephalic and atraumatic. Eyes:  Without icterus, sclera clear and conjunctiva pink.  Ears:  Normal auditory acuity. Nose:  No deformity, discharge,  or lesions. Mouth:  No deformity or lesions, oral mucosa pink.  Lungs:  Clear to auscultation bilaterally. No wheezes, rales, or rhonchi. No distress.  Heart:  S1, S2 present without murmurs appreciated.  Abdomen:  +BS, soft, non-tender and non-distended. No HSM noted. No guarding or rebound. No masses appreciated.  Rectal:  Deferred  Msk:  Symmetrical without gross deformities. Normal posture. Extremities:  Without clubbing or edema. Neurologic:  Alert and  oriented x4;  grossly normal neurologically. Skin:  Intact without significant lesions or rashes. Psych:  Alert and cooperative. Normal mood and affect.

## 2014-01-17 NOTE — Op Note (Signed)
[facility] [address] [place], [address]   COLONOSCOPY PROCEDURE REPORT  PATIENT: Tonya Cortez, Tonya Cortez  MR#: [id number] BIRTHDATE: 14-Mar-1941 , 72  yrs. old GENDER: female ENDOSCOPIST: R.  Garfield Cornea, MD FACP [facility] REFERRED NU:UVOZD Criss Rosales, M.D. PROCEDURE DATE:  February 10, 2014 PROCEDURE:   Colonoscopy with cold biopsy polypectomy INDICATIONS:surveillance colonoscopy based on a history of adenomatous colonic polyp(s). MEDICATIONS: Versed 4 mg IV and Demerol 50 mg IV in divided doses. Zofran 4 mg IV ASA CLASS:       Class III  CONSENT: The risks, benefits, alternatives and imponderables including but not limited to bleeding, perforation as well as the possibility of a missed lesion have been reviewed.  The potential for biopsy, lesion removal, etc. have also been discussed. Questions have been answered.  All parties agreeable.  Please see the history and physical in the medical record for more information.  DESCRIPTION OF PROCEDURE:   After the risks benefits and alternatives of the procedure were thoroughly explained, informed consent was obtained.  The digital rectal exam revealed no rectal mass and revealed no abnormalities of the rectum.   The EC-[id number] ([id number])  endoscope was introduced through the anus and advanced to the cecum, which was identified by both the appendix and ileocecal valve. No adverse events experienced.   The quality of the prep was adequate.  The instrument was then slowly withdrawn as the colon was fully examined.      COLON FINDINGS: Normal rectum.  Scattered left-sided diverticula; (2) diminutive polyps in the vicinity of the cecum.  Remainder of the colon appeared normal.  The above-mentioned polyps were cold biopsied/removed.  Retroflexed views revealed no abnormalities. .  Withdrawal time=8 minutes 0 seconds.  The scope was withdrawn and the procedure completed. COMPLICATIONS: There were no immediate  complications.  ENDOSCOPIC IMPRESSION: Colonic diverticulosis. Colonic polyps-removed as described above  RECOMMENDATIONS: Followup on pathology.  eSigned:  R. Garfield Cornea, MD Rosalita Chessman [facility] 02-10-2014 12:14 PM   cc:  CPT CODES: ICD CODES:  The ICD and CPT codes recommended by this software are interpretations from the data that the clinical staff has captured with the software.  The verification of the translation of this report to the ICD and CPT codes and modifiers is the sole responsibility of the health care institution and practicing physician where this report was generated.  [employer]. will not be held responsible for the validity of the ICD and CPT codes included on this report.  AMA assumes no liability for data contained or not contained herein. CPT is a Designer, television/film set of the [employer].  PATIENT NAME:  Tonya Cortez, Tonya Cortez MR#: [id number]

## 2014-01-17 NOTE — Interval H&P Note (Signed)
History and Physical Interval Note:  01/17/2014 11:43 AM  Tonya Cortez  has presented today for surgery, with the diagnosis of COLONOSCOPY   The various methods of treatment have been discussed with the patient and family. After consideration of risks, benefits and other options for treatment, the patient has consented to  Procedure(s) with comments: COLONOSCOPY (N/A) - 1115 as a surgical intervention .  The patient's history has been reviewed, patient examined, no change in status, stable for surgery.  I have reviewed the patient's chart and labs.  Questions were answered to the patient's satisfaction.     Shery [place]  No change. Colonoscopy per plan.  The risks, benefits, limitations, alternatives and imponderables have been reviewed with the patient. Potential for esophageal dilation, biopsy, etc. have also been reviewed.  Questions have been answered. All parties agreeable.

## 2014-01-19 ENCOUNTER — Encounter (HOSPITAL_COMMUNITY): Payer: Self-pay | Admitting: Internal Medicine

## 2014-01-22 ENCOUNTER — Encounter: Payer: Self-pay | Admitting: Internal Medicine

## 2014-02-03 ENCOUNTER — Emergency Department (HOSPITAL_COMMUNITY)
Admission: EM | Admit: 2014-02-03 | Discharge: 2014-02-03 | Disposition: A | Discharge status: Home / Self Care | Payer: Medicare Other | Attending: Emergency Medicine | Admitting: Emergency Medicine

## 2014-02-03 ENCOUNTER — Encounter (HOSPITAL_COMMUNITY): Payer: Self-pay | Admitting: Emergency Medicine

## 2014-02-03 DIAGNOSIS — Z79899 Other long term (current) drug therapy: Secondary | ICD-10-CM | POA: Insufficient documentation

## 2014-02-03 DIAGNOSIS — Z8739 Personal history of other diseases of the musculoskeletal system and connective tissue: Secondary | ICD-10-CM | POA: Diagnosis not present

## 2014-02-03 DIAGNOSIS — E780 Pure hypercholesterolemia: Secondary | ICD-10-CM | POA: Diagnosis not present

## 2014-02-03 DIAGNOSIS — R251 Tremor, unspecified: Secondary | ICD-10-CM | POA: Diagnosis not present

## 2014-02-03 DIAGNOSIS — H81399 Other peripheral vertigo, unspecified ear: Secondary | ICD-10-CM

## 2014-02-03 DIAGNOSIS — R42 Dizziness and giddiness: Secondary | ICD-10-CM | POA: Diagnosis present

## 2014-02-03 MED ORDER — DIAZEPAM 5 MG PO TABS: 5.0000 mg | ORAL_TABLET | Freq: Four times a day (QID) | ORAL | Status: DC | PRN

## 2014-02-03 MED ORDER — MECLIZINE HCL 25 MG PO TABS: 25.0000 mg | ORAL_TABLET | Freq: Four times a day (QID) | ORAL | Status: DC | PRN

## 2014-02-03 NOTE — Discharge Instructions (Signed)
Your exam shows you have had an episode of vertigo, which causes a false sense of movement such as a spinning feeling or walls that seem to move.  Most vertigo is caused by a (usually temporary) problem in the inner ear. Rarely, the back part of the brain can cause vertigo (some mini-strokes / TIA's / strokes), but it appears to be a low risk cause for you at this time. It is important to follow-up with your doctor however, to see if you need further testing.  Do not drive or participate in potentially dangerous activities requiring balance unless off meds (not drowsy) and the vertigo has resolved. Most of the time benign vertigo is much better after a few days. However, mild unsteadiness may last for up to 3 months in some patients. An MRI scan or other special tests to evaluate your hearing and balance may be needed if the vertigo does not improve or returns in the future. RETURN IMMEDIATELY IF YOU HAVE ANY OF THE FOLLOWING (call 911): Increasing vertigo, earache, ear drainage, or loss of hearing.  Severe headache, blurred or double vision, or trouble walking.  Fainting or poorly responsive, extreme weakness, chest pain, or palpitations.  Fever, persistent vomiting, or dehydration.  Numbness, tingling, incoordination, or weakness of the limbs.  Change in speech, vision, swallowing, understanding, or other concerns.

## 2014-02-03 NOTE — ED Notes (Addendum)
Patient c/o tremors. Per patient started feeling shaky with dizziness and stuttering this morning after waking. Patient still reports feeling some dizziness. Dr Stevie Kern made aware. Patient states "I think it's my nerves." Per patient was seen in August for same reason.

## 2014-02-03 NOTE — ED Provider Notes (Signed)
CSN: [id number]     Arrival date & time 02/03/14  0911 History  This chart was scribed for [name], MD by Peyton Bottoms, ED Scribe. This patient was seen in room APA04/APA04 and the patient's care was started at 9:22 AM.   Chief Complaint  Patient presents with  . Tremors  . Dizziness   Patient is a 73 y.o. female presenting with dizziness. The history is provided by the patient. No language interpreter was used.  Dizziness  HPI Comments: Tonya Cortez is a 73 y.o. female with a history of Bell's Palsy that affects left side of face, vertigo, PMH Breast cancer, who presents to the Emergency Department complaining of dizziness, described as positional vertigo when she woke up this morning like prior vertigo. Patient lives with her husband. She is ambulatory. Patient was last known well last night when she went to bed at 9PM. She states that she had onset of symptoms this morning as soon as she started moving. She felt weak all over with a room spinning sensation. She denies associated headache, change in speech, swallowing or understanding, visual changes, lateralizing weakness or numbness, nausea. This is similar to prior vertigo episode. She states that she is able to walk unassisted as long as she is able to hold onto something. She states that vertigo worsens with movement.    Past Medical History  Diagnosis Date  . Bell's palsy   . Arthritis   . Hypercholesterolemia   . Borderline diabetes    Past Surgical History  Procedure Laterality Date  . Abdominal hysterectomy    . Breast surgery    . Mastectomy Left     abnormal mass in breast, non-cancerous per patient.   . Colonoscopy  10/29/2008    [id number] examination with some left-sided diverticula/Diminutive cecal polyp, status post cold biopsy removal.  Right colonic mucosa appeared normal. adenomatous  . Colonoscopy N/A 01/17/2014    Procedure: COLONOSCOPY;  Surgeon: Daneil Dolin, MD;  Location: [facility];   Service: Endoscopy;  Laterality: N/A;  19   Family History  Problem Relation Age of Onset  . Colon cancer Neg Hx    History  Substance Use Topics  . Smoking status: Never Smoker   . Smokeless tobacco: Never Used  . Alcohol Use: No   OB History    No data available     Review of Systems  Neurological: Positive for dizziness.   10 Systems reviewed and are negative for acute change except as noted in the HPI.  Allergies  Review of patient's allergies indicates no known allergies.  Home Medications   Prior to Admission medications   Medication Sig Start Date End Date Taking? Authorizing Provider  diazepam (VALIUM) 5 MG tablet Take 1 tablet (5 mg total) by mouth every 6 (six) hours as needed (vertigo). 02/03/14   [name], MD  lansoprazole (PREVACID) 15 MG capsule Take 15 mg by mouth every morning.    Historical Provider, MD  meclizine (ANTIVERT) 25 MG tablet Take 25 mg by mouth 3 (three) times daily. 12/25/13   Historical Provider, MD  meclizine (ANTIVERT) 25 MG tablet Take 1 tablet (25 mg total) by mouth every 6 (six) hours as needed for dizziness or nausea. 02/03/14   [name], MD  Memantine HCl-Donepezil HCl [facility]) 28-10 MG CP24 Take 1 capsule by mouth every morning.    Historical Provider, MD  potassium chloride SA (K-DUR,KLOR-CON) 20 MEQ tablet Take 20 mEq by mouth daily.  Historical Provider, MD  rosuvastatin (CRESTOR) 20 MG tablet Take 20 mg by mouth daily.    Historical Provider, MD   Triage Vitals: BP 134/80  Pulse 64  Temp(Src) 98 F (36.7 C) (Oral)  Resp 18  Ht 5\' 1"  (1.549 m)  Wt 150 lb (68.04 kg)  BMI 28.36 kg/m2  SpO2 97%  Physical Exam  Constitutional: She is oriented to person, place, and time.  Awake, alert, nontoxic appearance with baseline speech for patient.  HENT:  Head: Atraumatic.  Mouth/Throat: No oropharyngeal exudate.  Eyes: EOM are normal. Pupils are equal, round, and reactive to light. Right eye exhibits no discharge. Left  eye exhibits no discharge.  Neck: Neck supple.  Cardiovascular: Normal rate and regular rhythm.   No murmur heard. Pulmonary/Chest: Effort normal and breath sounds normal. No stridor. No respiratory distress. She has no wheezes. She has no rales. She exhibits no tenderness.  Abdominal: Soft. Bowel sounds are normal. She exhibits no mass. There is no tenderness. There is no rebound.  Musculoskeletal: She exhibits no tenderness.  Baseline ROM, moves extremities with no obvious new focal weakness.  Lymphadenopathy:    She has no cervical adenopathy.  Neurological: She is alert and oriented to person, place, and time.  Awake, alert, cooperative and aware of situation; motor strength 5/5 bilaterally; sensation normal to light touch bilaterally; peripheral visual fields full to confrontation; baseline mild left facial droop facial asymmetry; tongue midline; major cranial nerves otherwise appear intact; no pronator drift, normal finger to nose bilaterally, baseline gait without new ataxia. No nystagmus and negative test of skew.  Skin: No rash noted.  Psychiatric: She has a normal mood and affect.  Nursing note and vitals reviewed.  ED Course  Procedures (including critical care time)  DIAGNOSTIC STUDIES: Oxygen Saturation is 97% on RA, normal by my interpretation.    COORDINATION OF CARE: 9:36 AM- Discussed plans to give patient valium medications to relieve vertigo symptoms. Patient / Family / Caregiver understand and agree with initial ED impression and plan with expectations set for ED visit.  Labs Review Labs Reviewed - No data to display  Imaging Review No results found.   EKG Interpretation   Date/Time:  Saturday February 03 2014 09:27:08 EDT Ventricular Rate:  67 PR Interval:  159 QRS Duration: 80 QT Interval:  392 QTC Calculation: 414 R Axis:   40 Text Interpretation:  Sinus rhythm Low voltage, precordial leads No  significant change since last tracing Confirmed by [facility]   MD, Jenny Reichmann  [phone]) on 02/03/2014 9:40:25 AM     MDM  The patient appears reasonably screened and/or stabilized for discharge and I doubt any other medical condition or other [facility] requiring further screening, evaluation, or treatment in the ED at this time prior to discharge. Final diagnoses:  Peripheral vertigo, unspecified laterality    I doubt any other EMC precluding discharge at this time including, but not necessarily limited to the following:SAH, CVA.  I personally performed the services described in this documentation, which was scribed in my presence. The recorded information has been reviewed and is accurate.  [name], MD 02/04/14 [phone]

## 2014-02-09 DIAGNOSIS — H8309 Labyrinthitis, unspecified ear: Secondary | ICD-10-CM | POA: Diagnosis not present

## 2014-02-09 DIAGNOSIS — F064 Anxiety disorder due to known physiological condition: Secondary | ICD-10-CM | POA: Diagnosis not present

## 2014-02-09 DIAGNOSIS — R413 Other amnesia: Secondary | ICD-10-CM | POA: Diagnosis not present

## 2014-02-09 DIAGNOSIS — I10 Essential (primary) hypertension: Secondary | ICD-10-CM | POA: Diagnosis not present

## 2014-03-07 DIAGNOSIS — H8309 Labyrinthitis, unspecified ear: Secondary | ICD-10-CM | POA: Diagnosis not present

## 2014-03-07 DIAGNOSIS — G309 Alzheimer's disease, unspecified: Secondary | ICD-10-CM | POA: Diagnosis not present

## 2014-03-07 DIAGNOSIS — F064 Anxiety disorder due to known physiological condition: Secondary | ICD-10-CM | POA: Diagnosis not present

## 2014-03-07 DIAGNOSIS — I10 Essential (primary) hypertension: Secondary | ICD-10-CM | POA: Diagnosis not present

## 2014-04-18 DIAGNOSIS — J399 Disease of upper respiratory tract, unspecified: Secondary | ICD-10-CM | POA: Diagnosis not present

## 2014-04-18 DIAGNOSIS — F064 Anxiety disorder due to known physiological condition: Secondary | ICD-10-CM | POA: Diagnosis not present

## 2014-04-18 DIAGNOSIS — G309 Alzheimer's disease, unspecified: Secondary | ICD-10-CM | POA: Diagnosis not present

## 2014-07-18 DIAGNOSIS — I10 Essential (primary) hypertension: Secondary | ICD-10-CM | POA: Diagnosis not present

## 2014-07-18 DIAGNOSIS — G309 Alzheimer's disease, unspecified: Secondary | ICD-10-CM | POA: Diagnosis not present

## 2014-07-31 DIAGNOSIS — H40053 Ocular hypertension, bilateral: Secondary | ICD-10-CM | POA: Diagnosis not present

## 2014-11-15 DIAGNOSIS — I10 Essential (primary) hypertension: Secondary | ICD-10-CM | POA: Diagnosis not present

## 2014-11-15 DIAGNOSIS — F064 Anxiety disorder due to known physiological condition: Secondary | ICD-10-CM | POA: Diagnosis not present

## 2014-11-15 DIAGNOSIS — H8309 Labyrinthitis, unspecified ear: Secondary | ICD-10-CM | POA: Diagnosis not present

## 2014-11-15 DIAGNOSIS — Z6830 Body mass index (BMI) 30.0-30.9, adult: Secondary | ICD-10-CM | POA: Diagnosis not present

## 2014-12-13 DIAGNOSIS — Z0000 Encounter for general adult medical examination without abnormal findings: Secondary | ICD-10-CM | POA: Diagnosis not present

## 2015-02-20 DIAGNOSIS — G40209 Localization-related (focal) (partial) symptomatic epilepsy and epileptic syndromes with complex partial seizures, not intractable, without status epilepticus: Secondary | ICD-10-CM | POA: Diagnosis not present

## 2015-02-20 DIAGNOSIS — G309 Alzheimer's disease, unspecified: Secondary | ICD-10-CM | POA: Diagnosis not present

## 2015-02-20 DIAGNOSIS — R413 Other amnesia: Secondary | ICD-10-CM | POA: Diagnosis not present

## 2015-02-20 DIAGNOSIS — E782 Mixed hyperlipidemia: Secondary | ICD-10-CM | POA: Diagnosis not present

## 2015-02-20 DIAGNOSIS — F064 Anxiety disorder due to known physiological condition: Secondary | ICD-10-CM | POA: Diagnosis not present

## 2015-02-20 DIAGNOSIS — R739 Hyperglycemia, unspecified: Secondary | ICD-10-CM | POA: Diagnosis not present

## 2015-03-22 DIAGNOSIS — E782 Mixed hyperlipidemia: Secondary | ICD-10-CM | POA: Diagnosis not present

## 2015-03-22 DIAGNOSIS — F064 Anxiety disorder due to known physiological condition: Secondary | ICD-10-CM | POA: Diagnosis not present

## 2015-03-22 DIAGNOSIS — G40209 Localization-related (focal) (partial) symptomatic epilepsy and epileptic syndromes with complex partial seizures, not intractable, without status epilepticus: Secondary | ICD-10-CM | POA: Diagnosis not present

## 2015-03-22 DIAGNOSIS — G44209 Tension-type headache, unspecified, not intractable: Secondary | ICD-10-CM | POA: Diagnosis not present

## 2015-03-24 ENCOUNTER — Emergency Department (HOSPITAL_COMMUNITY)
Admission: EM | Admit: 2015-03-24 | Discharge: 2015-03-24 | Disposition: A | Discharge status: Home / Self Care | Payer: Medicare Other | Attending: Emergency Medicine | Admitting: Emergency Medicine

## 2015-03-24 ENCOUNTER — Encounter (HOSPITAL_COMMUNITY): Payer: Self-pay | Admitting: Emergency Medicine

## 2015-03-24 DIAGNOSIS — Z859 Personal history of malignant neoplasm, unspecified: Secondary | ICD-10-CM | POA: Insufficient documentation

## 2015-03-24 DIAGNOSIS — R112 Nausea with vomiting, unspecified: Secondary | ICD-10-CM | POA: Diagnosis not present

## 2015-03-24 DIAGNOSIS — Z79899 Other long term (current) drug therapy: Secondary | ICD-10-CM | POA: Insufficient documentation

## 2015-03-24 DIAGNOSIS — Z8669 Personal history of other diseases of the nervous system and sense organs: Secondary | ICD-10-CM | POA: Insufficient documentation

## 2015-03-24 DIAGNOSIS — M1990 Unspecified osteoarthritis, unspecified site: Secondary | ICD-10-CM | POA: Diagnosis not present

## 2015-03-24 DIAGNOSIS — R42 Dizziness and giddiness: Secondary | ICD-10-CM | POA: Diagnosis not present

## 2015-03-24 DIAGNOSIS — E7800 Pure hypercholesterolemia, unspecified: Secondary | ICD-10-CM | POA: Insufficient documentation

## 2015-03-24 DIAGNOSIS — E876 Hypokalemia: Secondary | ICD-10-CM

## 2015-03-24 HISTORY — DX: Malignant (primary) neoplasm, unspecified: C80.1

## 2015-03-24 LAB — COMPREHENSIVE METABOLIC PANEL
ALK PHOS: 61 U/L (ref 38–126)
ALT: 35 U/L (ref 14–54)
AST: 36 U/L (ref 15–41)
Albumin: 4.6 g/dL (ref 3.5–5.0)
Anion gap: 7 (ref 5–15)
BILIRUBIN TOTAL: 0.5 mg/dL (ref 0.3–1.2)
BUN: 8 mg/dL (ref 6–20)
CO2: 29 mmol/L (ref 22–32)
Calcium: 9.1 mg/dL (ref 8.9–10.3)
Chloride: 106 mmol/L (ref 101–111)
Creatinine, Ser: 0.81 mg/dL (ref 0.44–1.00)
GFR calc Af Amer: >60 mL/min (ref >60)
GFR calc non Af Amer: >60 mL/min (ref >60)
GLUCOSE: 124 mg/dL — AB (ref 65–99)
POTASSIUM: 3.2 mmol/L — AB (ref 3.5–5.1)
Sodium: 142 mmol/L (ref 135–145)
TOTAL PROTEIN: 8.3 g/dL — AB (ref 6.5–8.1)

## 2015-03-24 LAB — URINALYSIS, ROUTINE W REFLEX MICROSCOPIC
BILIRUBIN URINE: NEGATIVE
Glucose, UA: NEGATIVE mg/dL
Hgb urine dipstick: NEGATIVE
KETONES UR: NEGATIVE mg/dL
LEUKOCYTES UA: NEGATIVE
NITRITE: NEGATIVE
PROTEIN: NEGATIVE mg/dL
Specific Gravity, Urine: 1.015 (ref 1.005–1.030)
pH: 7 (ref 5.0–8.0)

## 2015-03-24 LAB — CBC WITH DIFFERENTIAL/PLATELET
BASOS ABS: 0 10*3/uL (ref 0.0–0.1)
Basophils Relative: 0 %
EOS ABS: 0.1 10*3/uL (ref 0.0–0.7)
Eosinophils Relative: 1 %
HEMATOCRIT: 45.5 % (ref 36.0–46.0)
HEMOGLOBIN: 15.5 g/dL — AB (ref 12.0–15.0)
Lymphocytes Relative: 24 %
Lymphs Abs: 1.6 10*3/uL (ref 0.7–4.0)
MCH: 29.6 pg (ref 26.0–34.0)
MCHC: 34.1 g/dL (ref 30.0–36.0)
MCV: 87 fL (ref 78.0–100.0)
Monocytes Absolute: 0.4 10*3/uL (ref 0.1–1.0)
Monocytes Relative: 6 %
NEUTROS ABS: 4.6 10*3/uL (ref 1.7–7.7)
Neutrophils Relative %: 69 %
Platelets: 169 10*3/uL (ref 150–400)
RBC: 5.23 MIL/uL — ABNORMAL HIGH (ref 3.87–5.11)
RDW: 14.3 % (ref 11.5–15.5)
WBC: 6.7 10*3/uL (ref 4.0–10.5)

## 2015-03-24 LAB — LIPASE, BLOOD: LIPASE: 29 U/L (ref 11–51)

## 2015-03-24 LAB — I-STAT TROPONIN, ED: TROPONIN I, POC: 0 ng/mL (ref 0.00–0.08)

## 2015-03-24 MED ORDER — POTASSIUM CHLORIDE CRYS ER 20 MEQ PO TBCR
40.0000 meq | EXTENDED_RELEASE_TABLET | Freq: Once | ORAL | Status: AC
  Administered 2015-03-24: 40 meq via ORAL
  Filled 2015-03-24: qty 2

## 2015-03-24 NOTE — ED Notes (Signed)
Pt denies dizziness or nausea at this time. Pt states she never vomited. Pt also had another episode similar to this two weeks ago and was dx with vertigo. Pt states she became dizzy prior to church service.

## 2015-03-24 NOTE — ED Provider Notes (Signed)
CSN: [id number]     Arrival date & time 03/24/15  1122 History   First MD Initiated Contact with Patient 03/24/15 1256     Chief Complaint  Patient presents with  . Emesis    Patient is a 74 y.o. female presenting with vomiting. The history is provided by the patient and a relative.  Emesis Severity:  Mild Timing:  Rare Progression:  Resolved Chronicity:  New Relieved by:  Nothing Worsened by:  Nothing tried Associated symptoms: no abdominal pain, no diarrhea and no fever   Risk factors: no suspect food intake   Patient with dizziness and one episode of emesis She was at church and around 11am, she had episode of vertigo and was led to the bathroom and vomited once.  She is now improved No CP/SOB.  No syncope No new HA No new weakness No abd pain She reports mild heartburn but no other symptoms  She has long h/o vertigo.  She had episode of bells palsy previously and had residual dizziness and as well taste disturbance.   Today's episode of vertigo similar to prior She has had neurimaging previously for this She saw PCP recently (Dr bland) and [facility]  No HA at this time No neck pain at this time No new meds reported  Pt denies vomiting but husband who was at church confirms vomiting episode   Past Medical History  Diagnosis Date  . Bell's palsy   . Arthritis   . Hypercholesterolemia   . Borderline diabetes   . Cancer [facility])    Past Surgical History  Procedure Laterality Date  . Abdominal hysterectomy    . Breast surgery    . Mastectomy Left     abnormal mass in breast, non-cancerous per patient.   . Colonoscopy  10/29/2008    [id number] examination with some left-sided diverticula/Diminutive cecal polyp, status post cold biopsy removal.  Right colonic mucosa appeared normal. adenomatous  . Colonoscopy N/A 01/17/2014    Procedure: COLONOSCOPY;  Surgeon: Daneil Dolin, MD;  Location: [facility];  Service:  Endoscopy;  Laterality: N/A;  1115  . Mastectomy     Family History  Problem Relation Age of Onset  . Colon cancer Neg Hx    Social History  Substance Use Topics  . Smoking status: Never Smoker   . Smokeless tobacco: Never Used  . Alcohol Use: No   OB History    Gravida Para Term Preterm AB TAB SAB Ectopic Multiple Living   5 5 3 2      3      Review of Systems  Constitutional: Negative for fever.  HENT: Negative for hearing loss.   Eyes: Negative for visual disturbance.  Respiratory: Negative for shortness of breath.   Cardiovascular: Negative for chest pain.  Gastrointestinal: Positive for vomiting. Negative for abdominal pain and diarrhea.  Neurological: Positive for dizziness. Negative for syncope and speech difficulty.  All other systems reviewed and are negative.     Allergies  Review of patient's allergies indicates no known allergies.  Home Medications   Prior to Admission medications   Medication Sig Start Date End Date Taking? Authorizing Provider  Aspirin-Acetaminophen-Caffeine (GOODY HEADACHE PO) Take 1 Package by mouth daily as needed (headache).   Yes Historical Provider, MD  diazepam (VALIUM) 5 MG tablet Take 1 tablet (5 mg total) by mouth every 6 (six) hours as needed (vertigo). 02/03/14  Yes Riki Altes, MD  lansoprazole (PREVACID) 15 MG capsule  Take 15 mg by mouth every morning.   Yes Historical Provider, MD  meclizine (ANTIVERT) 25 MG tablet Take 1 tablet (25 mg total) by mouth every 6 (six) hours as needed for dizziness or nausea. 02/03/14  Yes Riki Altes, MD  Memantine HCl-Donepezil HCl [facility]) 28-10 MG CP24 Take 1 capsule by mouth every morning.   Yes Historical Provider, MD  potassium chloride SA (K-DUR,KLOR-CON) 20 MEQ tablet Take 20 mEq by mouth daily.   Yes Historical Provider, MD  rosuvastatin (CRESTOR) 20 MG tablet Take 20 mg by mouth daily.   Yes Historical Provider, MD   BP 186/82 mmHg  Pulse 72  Temp(Src) 97.5 F (36.4 C) (Oral)   Resp 16  Ht 5\' 1"  (1.549 m)  Wt 70.761 kg  BMI 29.49 kg/m2  SpO2 98% Physical Exam CONSTITUTIONAL: Well developed/well nourished HEAD: Normocephalic/atraumatic EYES: EOMI/PERRL, no nystagmus   no ptosis ENMT: Mucous membranes moist NECK: supple no meningeal signs, no bruits CV: S1/S2 noted, no murmurs/rubs/gallops noted LUNGS: Lungs are clear to auscultation bilaterally, no apparent distress ABDOMEN: soft, nontender, no rebound or guarding GU:no cva tenderness NEURO:Awake/alert, face symmetric, no arm or leg drift is noted Equal 5/5 strength with shoulder abduction, elbow flex/extension, wrist flex/extension in upper extremities and equal hand grips bilaterally Equal 5/5 strength with hip flexion,knee flex/extension, foot dorsi/plantar flexion Cranial nerves 3/4/5/6/10/12/08/11/12 tested and intact She reports mild dizziness upon standing but is able to ambulate and no ataxia No past pointing EXTREMITIES: pulses normal, full ROM SKIN: warm, color normal PSYCH: no abnormalities of mood noted  ED Course  Procedures   Pt an episode of vomiting and vertigo earlier at church, now resolved She has long h/o episode of vertigo similar to today's episode She is now at baseline She has no other signs of acute cva She has had neuroimaging previously and I don't feel acute imaging warranted Pt smiling, well appearing and in no distress Will d/c home with continue f/u with outpatient providers We discussed strict ER return precautions  Labs Review Labs Reviewed  CBC WITH DIFFERENTIAL/PLATELET - Abnormal; Notable for the following:    RBC 5.23 (*)    Hemoglobin 15.5 (*)    All other components within normal limits  COMPREHENSIVE METABOLIC PANEL - Abnormal; Notable for the following:    Potassium 3.2 (*)    Glucose, Bld 124 (*)    Total Protein 8.3 (*)    All other components within normal limits  LIPASE, BLOOD  URINALYSIS, ROUTINE W REFLEX MICROSCOPIC (NOT AT [facility])  I-STAT  TROPOININ, ED   I have personally reviewed and evaluated these lab results as part of my medical decision-making.   EKG Interpretation   Date/Time:  Sunday March 24 2015 13:05:18 EST Ventricular Rate:  71 PR Interval:  161 QRS Duration: 80 QT Interval:  427 QTC Calculation: 464 R Axis:   27 Text Interpretation:  Sinus rhythm Low voltage, precordial leads Abnormal  R-wave progression, early transition Borderline T abnormalities, anterior  leads Baseline wander in lead(s) III No significant change since last  tracing Confirmed by Christy Gentles  MD, Elenore Rota ([id number]) on 03/24/2015 1:37:40 PM     Medications  potassium chloride SA (K-DUR,KLOR-CON) CR tablet 40 mEq (40 mEq Oral Given 03/24/15 1455)    MDM   Final diagnoses:  Vertigo  Non-intractable vomiting with nausea, vomiting of unspecified type  Hypokalemia    Nursing notes including past medical history and social history reviewed and considered in documentation Labs/vital reviewed myself and considered during evaluation  Previous records reviewed and considered     Ripley Fraise, MD 03/24/15 1547

## 2015-03-24 NOTE — Discharge Instructions (Signed)

## 2015-03-24 NOTE — ED Notes (Signed)
Patient c/o nausea and vomiting that started suddenly this morning. Patient denies any abd pain, diarrhea, or fevers. Per patient did have some dizziness in which patient is seeing a neurologist for vertigo.

## 2015-04-02 ENCOUNTER — Other Ambulatory Visit: Payer: Self-pay | Admitting: Specialist

## 2015-04-02 DIAGNOSIS — R519 Headache, unspecified: Secondary | ICD-10-CM

## 2015-04-02 DIAGNOSIS — Z79899 Other long term (current) drug therapy: Secondary | ICD-10-CM | POA: Diagnosis not present

## 2015-04-02 DIAGNOSIS — R51 Headache: Secondary | ICD-10-CM | POA: Diagnosis not present

## 2015-04-02 DIAGNOSIS — Z049 Encounter for examination and observation for unspecified reason: Secondary | ICD-10-CM | POA: Diagnosis not present

## 2015-04-05 DIAGNOSIS — M542 Cervicalgia: Secondary | ICD-10-CM | POA: Diagnosis not present

## 2015-04-05 DIAGNOSIS — M791 Myalgia: Secondary | ICD-10-CM | POA: Diagnosis not present

## 2015-04-05 DIAGNOSIS — R51 Headache: Secondary | ICD-10-CM | POA: Diagnosis not present

## 2015-04-05 DIAGNOSIS — G518 Other disorders of facial nerve: Secondary | ICD-10-CM | POA: Diagnosis not present

## 2015-04-09 DIAGNOSIS — M9903 Segmental and somatic dysfunction of lumbar region: Secondary | ICD-10-CM | POA: Diagnosis not present

## 2015-04-09 DIAGNOSIS — M9902 Segmental and somatic dysfunction of thoracic region: Secondary | ICD-10-CM | POA: Diagnosis not present

## 2015-04-09 DIAGNOSIS — M47812 Spondylosis without myelopathy or radiculopathy, cervical region: Secondary | ICD-10-CM | POA: Diagnosis not present

## 2015-04-09 DIAGNOSIS — M546 Pain in thoracic spine: Secondary | ICD-10-CM | POA: Diagnosis not present

## 2015-04-09 DIAGNOSIS — M9901 Segmental and somatic dysfunction of cervical region: Secondary | ICD-10-CM | POA: Diagnosis not present

## 2015-04-09 DIAGNOSIS — M47816 Spondylosis without myelopathy or radiculopathy, lumbar region: Secondary | ICD-10-CM | POA: Diagnosis not present

## 2015-04-09 DIAGNOSIS — G43001 Migraine without aura, not intractable, with status migrainosus: Secondary | ICD-10-CM | POA: Diagnosis not present

## 2015-04-10 ENCOUNTER — Ambulatory Visit (HOSPITAL_COMMUNITY): Payer: Medicare Other | Attending: Family Medicine | Admitting: Physical Therapy

## 2015-04-10 DIAGNOSIS — M542 Cervicalgia: Secondary | ICD-10-CM | POA: Diagnosis not present

## 2015-04-10 DIAGNOSIS — R293 Abnormal posture: Secondary | ICD-10-CM | POA: Insufficient documentation

## 2015-04-10 DIAGNOSIS — G729 Myopathy, unspecified: Secondary | ICD-10-CM | POA: Insufficient documentation

## 2015-04-10 DIAGNOSIS — M436 Torticollis: Secondary | ICD-10-CM | POA: Diagnosis not present

## 2015-04-10 DIAGNOSIS — M6289 Other specified disorders of muscle: Secondary | ICD-10-CM

## 2015-04-10 NOTE — Patient Instructions (Signed)
   RETRACTION / CHIN TUCK  Slowly draw your head back so that your ears line up with your shoulders. Think about it like you are going to make a "double chin" as you are pulling your head backwards.   Repeat 10 times per day, twice a day.    Shoulder Rolls  Sit with arm resting on a table (or bed). Start with arm circles, focusing on moving the shoulder while keeping the arm resting on the table. Roll your shoulders backward so that you are really opening up your chest.   Repeat 15 times, twice a day.
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
## 2015-04-11 DIAGNOSIS — M9903 Segmental and somatic dysfunction of lumbar region: Secondary | ICD-10-CM | POA: Diagnosis not present

## 2015-04-11 DIAGNOSIS — M546 Pain in thoracic spine: Secondary | ICD-10-CM | POA: Diagnosis not present

## 2015-04-11 DIAGNOSIS — M9901 Segmental and somatic dysfunction of cervical region: Secondary | ICD-10-CM | POA: Diagnosis not present

## 2015-04-11 DIAGNOSIS — G43001 Migraine without aura, not intractable, with status migrainosus: Secondary | ICD-10-CM | POA: Diagnosis not present

## 2015-04-11 DIAGNOSIS — M9902 Segmental and somatic dysfunction of thoracic region: Secondary | ICD-10-CM | POA: Diagnosis not present

## 2015-04-11 DIAGNOSIS — M47812 Spondylosis without myelopathy or radiculopathy, cervical region: Secondary | ICD-10-CM | POA: Diagnosis not present

## 2015-04-11 DIAGNOSIS — M47816 Spondylosis without myelopathy or radiculopathy, lumbar region: Secondary | ICD-10-CM | POA: Diagnosis not present

## 2015-04-12 ENCOUNTER — Ambulatory Visit (HOSPITAL_COMMUNITY): Payer: Medicare Other

## 2015-04-12 ENCOUNTER — Ambulatory Visit
Admission: RE | Admit: 2015-04-12 | Discharge: 2015-04-12 | Disposition: A | Discharge status: Home / Self Care | Payer: Medicare Other | Source: Ambulatory Visit | Attending: Specialist | Admitting: Specialist

## 2015-04-12 DIAGNOSIS — R51 Headache: Secondary | ICD-10-CM | POA: Diagnosis not present

## 2015-04-12 DIAGNOSIS — M6289 Other specified disorders of muscle: Secondary | ICD-10-CM

## 2015-04-12 DIAGNOSIS — R42 Dizziness and giddiness: Secondary | ICD-10-CM | POA: Diagnosis not present

## 2015-04-12 DIAGNOSIS — R519 Headache, unspecified: Secondary | ICD-10-CM

## 2015-04-12 DIAGNOSIS — G729 Myopathy, unspecified: Secondary | ICD-10-CM | POA: Diagnosis not present

## 2015-04-12 DIAGNOSIS — M436 Torticollis: Secondary | ICD-10-CM

## 2015-04-12 DIAGNOSIS — R293 Abnormal posture: Secondary | ICD-10-CM

## 2015-04-12 DIAGNOSIS — M542 Cervicalgia: Secondary | ICD-10-CM | POA: Diagnosis not present
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
## 2015-04-15 DIAGNOSIS — G43001 Migraine without aura, not intractable, with status migrainosus: Secondary | ICD-10-CM | POA: Diagnosis not present

## 2015-04-15 DIAGNOSIS — M9903 Segmental and somatic dysfunction of lumbar region: Secondary | ICD-10-CM | POA: Diagnosis not present

## 2015-04-15 DIAGNOSIS — M9902 Segmental and somatic dysfunction of thoracic region: Secondary | ICD-10-CM | POA: Diagnosis not present

## 2015-04-15 DIAGNOSIS — M546 Pain in thoracic spine: Secondary | ICD-10-CM | POA: Diagnosis not present

## 2015-04-15 DIAGNOSIS — M47816 Spondylosis without myelopathy or radiculopathy, lumbar region: Secondary | ICD-10-CM | POA: Diagnosis not present

## 2015-04-15 DIAGNOSIS — M47812 Spondylosis without myelopathy or radiculopathy, cervical region: Secondary | ICD-10-CM | POA: Diagnosis not present

## 2015-04-15 DIAGNOSIS — M9901 Segmental and somatic dysfunction of cervical region: Secondary | ICD-10-CM | POA: Diagnosis not present

## 2015-04-17 ENCOUNTER — Ambulatory Visit (HOSPITAL_COMMUNITY): Payer: Medicare Other

## 2015-04-17 DIAGNOSIS — G729 Myopathy, unspecified: Secondary | ICD-10-CM | POA: Diagnosis not present

## 2015-04-17 DIAGNOSIS — M436 Torticollis: Secondary | ICD-10-CM

## 2015-04-17 DIAGNOSIS — M542 Cervicalgia: Secondary | ICD-10-CM

## 2015-04-17 DIAGNOSIS — R293 Abnormal posture: Secondary | ICD-10-CM

## 2015-04-17 DIAGNOSIS — M6289 Other specified disorders of muscle: Secondary | ICD-10-CM
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
## 2015-04-18 DIAGNOSIS — M9903 Segmental and somatic dysfunction of lumbar region: Secondary | ICD-10-CM | POA: Diagnosis not present

## 2015-04-18 DIAGNOSIS — G43001 Migraine without aura, not intractable, with status migrainosus: Secondary | ICD-10-CM | POA: Diagnosis not present

## 2015-04-18 DIAGNOSIS — M546 Pain in thoracic spine: Secondary | ICD-10-CM | POA: Diagnosis not present

## 2015-04-18 DIAGNOSIS — M47816 Spondylosis without myelopathy or radiculopathy, lumbar region: Secondary | ICD-10-CM | POA: Diagnosis not present

## 2015-04-18 DIAGNOSIS — M9902 Segmental and somatic dysfunction of thoracic region: Secondary | ICD-10-CM | POA: Diagnosis not present

## 2015-04-18 DIAGNOSIS — M47812 Spondylosis without myelopathy or radiculopathy, cervical region: Secondary | ICD-10-CM | POA: Diagnosis not present

## 2015-04-18 DIAGNOSIS — M9901 Segmental and somatic dysfunction of cervical region: Secondary | ICD-10-CM | POA: Diagnosis not present

## 2015-04-19 ENCOUNTER — Ambulatory Visit (HOSPITAL_COMMUNITY): Payer: Medicare Other

## 2015-04-19 DIAGNOSIS — M542 Cervicalgia: Secondary | ICD-10-CM | POA: Diagnosis not present

## 2015-04-19 DIAGNOSIS — M6289 Other specified disorders of muscle: Secondary | ICD-10-CM

## 2015-04-19 DIAGNOSIS — M436 Torticollis: Secondary | ICD-10-CM | POA: Diagnosis not present

## 2015-04-19 DIAGNOSIS — G729 Myopathy, unspecified: Secondary | ICD-10-CM | POA: Diagnosis not present

## 2015-04-19 DIAGNOSIS — R293 Abnormal posture: Secondary | ICD-10-CM | POA: Diagnosis not present
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
## 2015-04-22 DIAGNOSIS — G518 Other disorders of facial nerve: Secondary | ICD-10-CM | POA: Diagnosis not present

## 2015-04-22 DIAGNOSIS — R51 Headache: Secondary | ICD-10-CM | POA: Diagnosis not present

## 2015-04-22 DIAGNOSIS — M542 Cervicalgia: Secondary | ICD-10-CM | POA: Diagnosis not present

## 2015-04-22 DIAGNOSIS — M791 Myalgia: Secondary | ICD-10-CM | POA: Diagnosis not present

## 2015-04-23 DIAGNOSIS — G43001 Migraine without aura, not intractable, with status migrainosus: Secondary | ICD-10-CM | POA: Diagnosis not present

## 2015-04-23 DIAGNOSIS — M9901 Segmental and somatic dysfunction of cervical region: Secondary | ICD-10-CM | POA: Diagnosis not present

## 2015-04-23 DIAGNOSIS — M47812 Spondylosis without myelopathy or radiculopathy, cervical region: Secondary | ICD-10-CM | POA: Diagnosis not present

## 2015-04-23 DIAGNOSIS — M47816 Spondylosis without myelopathy or radiculopathy, lumbar region: Secondary | ICD-10-CM | POA: Diagnosis not present

## 2015-04-23 DIAGNOSIS — M9903 Segmental and somatic dysfunction of lumbar region: Secondary | ICD-10-CM | POA: Diagnosis not present

## 2015-04-23 DIAGNOSIS — M9902 Segmental and somatic dysfunction of thoracic region: Secondary | ICD-10-CM | POA: Diagnosis not present

## 2015-04-23 DIAGNOSIS — M546 Pain in thoracic spine: Secondary | ICD-10-CM | POA: Diagnosis not present

## 2015-04-24 ENCOUNTER — Ambulatory Visit (HOSPITAL_COMMUNITY): Payer: Medicare Other

## 2015-04-24 DIAGNOSIS — M436 Torticollis: Secondary | ICD-10-CM | POA: Diagnosis not present

## 2015-04-24 DIAGNOSIS — M542 Cervicalgia: Secondary | ICD-10-CM

## 2015-04-24 DIAGNOSIS — M6289 Other specified disorders of muscle: Secondary | ICD-10-CM

## 2015-04-24 DIAGNOSIS — R293 Abnormal posture: Secondary | ICD-10-CM | POA: Diagnosis not present

## 2015-04-24 DIAGNOSIS — G729 Myopathy, unspecified: Secondary | ICD-10-CM | POA: Diagnosis not present
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
## 2015-04-25 ENCOUNTER — Ambulatory Visit (HOSPITAL_COMMUNITY): Payer: Medicare Other

## 2015-04-25 DIAGNOSIS — M436 Torticollis: Secondary | ICD-10-CM | POA: Diagnosis not present

## 2015-04-25 DIAGNOSIS — M542 Cervicalgia: Secondary | ICD-10-CM

## 2015-04-25 DIAGNOSIS — Z6830 Body mass index (BMI) 30.0-30.9, adult: Secondary | ICD-10-CM | POA: Diagnosis not present

## 2015-04-25 DIAGNOSIS — R293 Abnormal posture: Secondary | ICD-10-CM

## 2015-04-25 DIAGNOSIS — M6289 Other specified disorders of muscle: Secondary | ICD-10-CM

## 2015-04-25 DIAGNOSIS — G729 Myopathy, unspecified: Secondary | ICD-10-CM | POA: Diagnosis not present

## 2015-04-25 DIAGNOSIS — I10 Essential (primary) hypertension: Secondary | ICD-10-CM | POA: Diagnosis not present

## 2015-04-25 DIAGNOSIS — G44209 Tension-type headache, unspecified, not intractable: Secondary | ICD-10-CM | POA: Diagnosis not present

## 2015-04-25 DIAGNOSIS — G40209 Localization-related (focal) (partial) symptomatic epilepsy and epileptic syndromes with complex partial seizures, not intractable, without status epilepticus: Secondary | ICD-10-CM | POA: Diagnosis not present
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
## 2015-05-01 ENCOUNTER — Encounter (HOSPITAL_COMMUNITY): Payer: Medicare Other | Admitting: Physical Therapy

## 2015-05-03 ENCOUNTER — Ambulatory Visit (HOSPITAL_COMMUNITY): Payer: Medicare Other

## 2015-05-03 DIAGNOSIS — M542 Cervicalgia: Secondary | ICD-10-CM | POA: Diagnosis not present

## 2015-05-03 DIAGNOSIS — M6289 Other specified disorders of muscle: Secondary | ICD-10-CM

## 2015-05-03 DIAGNOSIS — R293 Abnormal posture: Secondary | ICD-10-CM | POA: Diagnosis not present

## 2015-05-03 DIAGNOSIS — M436 Torticollis: Secondary | ICD-10-CM | POA: Diagnosis not present

## 2015-05-03 DIAGNOSIS — G729 Myopathy, unspecified: Secondary | ICD-10-CM | POA: Diagnosis not present
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
## 2015-05-06 DIAGNOSIS — R51 Headache: Secondary | ICD-10-CM | POA: Diagnosis not present

## 2015-05-06 DIAGNOSIS — G518 Other disorders of facial nerve: Secondary | ICD-10-CM | POA: Diagnosis not present

## 2015-05-06 DIAGNOSIS — M542 Cervicalgia: Secondary | ICD-10-CM | POA: Diagnosis not present

## 2015-05-06 DIAGNOSIS — M791 Myalgia: Secondary | ICD-10-CM | POA: Diagnosis not present

## 2015-05-08 ENCOUNTER — Ambulatory Visit (HOSPITAL_COMMUNITY): Payer: Medicare Other | Attending: Family Medicine | Admitting: Physical Therapy

## 2015-05-08 DIAGNOSIS — M542 Cervicalgia: Secondary | ICD-10-CM | POA: Diagnosis not present

## 2015-05-08 DIAGNOSIS — R2681 Unsteadiness on feet: Secondary | ICD-10-CM | POA: Diagnosis not present

## 2015-05-08 DIAGNOSIS — R29898 Other symptoms and signs involving the musculoskeletal system: Secondary | ICD-10-CM | POA: Diagnosis not present

## 2015-05-08 DIAGNOSIS — M436 Torticollis: Secondary | ICD-10-CM | POA: Diagnosis not present

## 2015-05-08 DIAGNOSIS — H832X1 Labyrinthine dysfunction, right ear: Secondary | ICD-10-CM | POA: Insufficient documentation

## 2015-05-08 DIAGNOSIS — G729 Myopathy, unspecified: Secondary | ICD-10-CM | POA: Insufficient documentation

## 2015-05-08 DIAGNOSIS — R293 Abnormal posture: Secondary | ICD-10-CM

## 2015-05-08 DIAGNOSIS — M6289 Other specified disorders of muscle: Secondary | ICD-10-CM
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
## 2015-05-10 ENCOUNTER — Ambulatory Visit (HOSPITAL_COMMUNITY): Payer: Medicare Other

## 2015-05-10 ENCOUNTER — Encounter (HOSPITAL_COMMUNITY): Payer: Self-pay

## 2015-05-10 DIAGNOSIS — R293 Abnormal posture: Secondary | ICD-10-CM | POA: Diagnosis not present

## 2015-05-10 DIAGNOSIS — M542 Cervicalgia: Secondary | ICD-10-CM | POA: Diagnosis not present

## 2015-05-10 DIAGNOSIS — M436 Torticollis: Secondary | ICD-10-CM

## 2015-05-10 DIAGNOSIS — H832X1 Labyrinthine dysfunction, right ear: Secondary | ICD-10-CM | POA: Diagnosis not present

## 2015-05-10 DIAGNOSIS — M6289 Other specified disorders of muscle: Secondary | ICD-10-CM

## 2015-05-10 DIAGNOSIS — G729 Myopathy, unspecified: Secondary | ICD-10-CM | POA: Diagnosis not present

## 2015-05-10 DIAGNOSIS — R2681 Unsteadiness on feet: Secondary | ICD-10-CM | POA: Diagnosis not present
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
## 2015-05-10 NOTE — Patient Instructions (Signed)
  SCAPULAR RETRACTIONS  Draw your shoulder blades back and down. 15 reps, 1 set, 3x/day, everyday    Upper Trap Stretch  Place your R arm behind your back and side bend your head as if your placing your Left ear on your shoulder.  Only go to a comfortable stretch.  Maintain good trunk posture. Hold for 30 seconds and repeated 3 times on both sides. Complete the stretch 1-2x/day       CERVICAL ROTATION  Turn your head towards the side, then return back to looking straight ahead. Then turn it to the other side Complete 10-15 turns to both sides taking a break when you need one. Complete this daily 1-2x/day        SUPINE CHIN TUCKS  Lie on back.  Hands on belly or to side.  Press  back of head to surface.  Relax and repeat.   Complete 10 times holding each one for 2 seconds. Complete 1-2x/day        ELASTIC BAND ROWS   Holding elastic band with both hands, draw back the band as you bend your elbows. Keep your elbows near the side of your body. 3 sets of 10 reps with use of red thera-band, every other day      ELASTIC BAND SHOULDER EXTENSION  While holding an elastic band in front of you with your elbows straight, pull the band down and back towards your side. 3 sets of 10 reps with use of red thera-band, every other day

## 2015-05-14 ENCOUNTER — Encounter (HOSPITAL_COMMUNITY): Payer: Medicare Other

## 2015-05-15 ENCOUNTER — Encounter (HOSPITAL_COMMUNITY): Payer: Medicare Other | Admitting: Physical Therapy

## 2015-05-17 ENCOUNTER — Encounter (HOSPITAL_COMMUNITY): Payer: Medicare Other

## 2015-05-20 ENCOUNTER — Ambulatory Visit (HOSPITAL_COMMUNITY): Payer: Medicare Other

## 2015-05-20 DIAGNOSIS — G518 Other disorders of facial nerve: Secondary | ICD-10-CM | POA: Diagnosis not present

## 2015-05-20 DIAGNOSIS — R51 Headache: Secondary | ICD-10-CM | POA: Diagnosis not present

## 2015-05-20 DIAGNOSIS — M791 Myalgia: Secondary | ICD-10-CM | POA: Diagnosis not present

## 2015-05-20 DIAGNOSIS — M542 Cervicalgia: Secondary | ICD-10-CM | POA: Diagnosis not present

## 2015-05-21 DIAGNOSIS — M9903 Segmental and somatic dysfunction of lumbar region: Secondary | ICD-10-CM | POA: Diagnosis not present

## 2015-05-21 DIAGNOSIS — G43001 Migraine without aura, not intractable, with status migrainosus: Secondary | ICD-10-CM | POA: Diagnosis not present

## 2015-05-21 DIAGNOSIS — M47812 Spondylosis without myelopathy or radiculopathy, cervical region: Secondary | ICD-10-CM | POA: Diagnosis not present

## 2015-05-21 DIAGNOSIS — M9901 Segmental and somatic dysfunction of cervical region: Secondary | ICD-10-CM | POA: Diagnosis not present

## 2015-05-21 DIAGNOSIS — M546 Pain in thoracic spine: Secondary | ICD-10-CM | POA: Diagnosis not present

## 2015-05-21 DIAGNOSIS — M47816 Spondylosis without myelopathy or radiculopathy, lumbar region: Secondary | ICD-10-CM | POA: Diagnosis not present

## 2015-05-21 DIAGNOSIS — M9902 Segmental and somatic dysfunction of thoracic region: Secondary | ICD-10-CM | POA: Diagnosis not present

## 2015-05-22 ENCOUNTER — Encounter (HOSPITAL_COMMUNITY): Payer: Medicare Other | Admitting: Physical Therapy

## 2015-05-27 ENCOUNTER — Encounter (HOSPITAL_COMMUNITY): Payer: Self-pay

## 2015-05-27 ENCOUNTER — Ambulatory Visit (HOSPITAL_COMMUNITY): Payer: Medicare Other

## 2015-05-27 VITALS — BP 134/70 | HR 64 | Resp 17

## 2015-05-27 DIAGNOSIS — H832X1 Labyrinthine dysfunction, right ear: Secondary | ICD-10-CM | POA: Diagnosis not present

## 2015-05-27 DIAGNOSIS — R2681 Unsteadiness on feet: Secondary | ICD-10-CM

## 2015-05-27 DIAGNOSIS — R293 Abnormal posture: Secondary | ICD-10-CM | POA: Diagnosis not present

## 2015-05-27 DIAGNOSIS — G729 Myopathy, unspecified: Secondary | ICD-10-CM | POA: Diagnosis not present

## 2015-05-27 DIAGNOSIS — M542 Cervicalgia: Secondary | ICD-10-CM | POA: Diagnosis not present

## 2015-05-27 DIAGNOSIS — M436 Torticollis: Secondary | ICD-10-CM | POA: Diagnosis not present

## 2015-05-27 DIAGNOSIS — R29898 Other symptoms and signs involving the musculoskeletal system: Secondary | ICD-10-CM

## 2015-05-27 NOTE — Therapy (Signed)
[place], [place], [address] Phone: [phone]   Fax:  [phone]  Physical Therapy Evaluation  Patient Details  Name: Tonya Cortez MRN: [id number] Date of Birth: October 31, 1940 Referring Provider: Dr. Lucianne Lei  Encounter Date: 05/27/2015      PT End of Session - 05/27/15 0900    Visit Number 1   Number of Visits 13   Date for PT Re-Evaluation 06/26/15   Authorization Type Medicare/[employer]    Authorization Time Period 05/26/2012 to 07/08/2015   PT Start Time 0805   PT Stop Time 0847   PT Time Calculation (min) 42 min   Equipment Utilized During Treatment Gait belt   Activity Tolerance Patient tolerated treatment well   Behavior During Therapy [facility] for tasks assessed/performed      Past Medical History  Diagnosis Date  . Bell's palsy   . Arthritis   . Hypercholesterolemia   . Borderline diabetes   . Cancer [facility])     Past Surgical History  Procedure Laterality Date  . Abdominal hysterectomy    . Breast surgery    . Mastectomy Left     abnormal mass in breast, non-cancerous per patient.   . Colonoscopy  10/29/2008    [id number] examination with some left-sided diverticula/Diminutive cecal polyp, status post cold biopsy removal.  Right colonic mucosa appeared normal. adenomatous  . Colonoscopy N/A 01/17/2014    Procedure: COLONOSCOPY;  Surgeon: Daneil Dolin, MD;  [facility];  Service: Endoscopy;  Laterality: N/A;  1115  . Mastectomy      Filed Vitals:   05/27/15 0810 05/27/15 0816  BP: 130/70 134/70  Pulse: 64 64  Resp: 17 17    Visit Diagnosis:  Vestibular hypofunction of right ear - Plan: PT plan of care cert/re-cert  Unsteadiness on feet - Plan: PT plan of care cert/re-cert  Unsteady gait - Plan: PT plan of care cert/re-cert  Weakness of both lower extremities - Plan: PT plan of care cert/re-cert      Subjective Assessment - 05/27/15 0812    Subjective Tonya Cortez is a 75 yo female who currently c/o intermittent dizziness and feeling unsteady that began "a few years ago". Pt noted that she has not fallen within the past year, but has come close on a few occasions. Pt denied recent and current pain. Pt reports that her dizziness is mainly exacerbated while she is ambulating or when turning quickly. Pt denies dizziness while at rest or with transfers. Dizziness was rated a 0/10 upon arrival and ranges between a 0-5/10.     Patient is accompained by: Family member   Pertinent History hx of neck pain and Bell's Palsy    How long can you sit comfortably? No difficulty    How long can you stand comfortably? No difficulty    How long can you walk comfortably? No difficulty    Patient Stated Goals Pt's goal is to reduce her dizziness and improve her balance while ambulating    Currently in Pain? No/denies   Multiple Pain Sites No            [facility] PT Assessment - 05/27/15 0001    Assessment   Medical Diagnosis Vertigo/ unsteady balance    Referring Provider Dr. Lucianne Lei   Onset Date/Surgical Date 05/03/15   Hand Dominance Right   Next MD Visit Unknown per pt   Prior Therapy for neck pain    Precautions  Precautions Fall   Restrictions   Weight Bearing Restrictions No   Balance Screen   Has the patient fallen in the past 6 months No   Has the patient had a decrease in activity level because of a fear of falling?  No   Is the patient reluctant to leave their home because of a fear of falling?  No   Home Environment   Living Environment Private residence   Living Arrangements Spouse/significant other   Available Help at Discharge Family   Type of [place] to enter   Home Layout Two level   Alternate Level Stairs-Number of Steps 13   Prior Function   Level of Independence Independent with basic ADLs;Independent with household mobility without device   Vocation Retired   Leisure sewing (makes caps)   Cognition    Overall Cognitive Status --   Observation/Other Assessments   Focus on Therapeutic Outcomes (FOTO)  32% limited    Sensation   Light Touch Appears Intact   Single Leg Stance   Comments L LE= 3 seconds; R LE= 14 sec    Posture/Postural Control   Posture Comments forward head, kyphotic posture in general    Strength   Right Hip Flexion 4/5   Right Hip Extension 4-/5   Right Hip ABduction 4/5   Right Hip ADduction 4+/5   Left Hip Flexion 4/5   Left Hip Extension 4-/5   Left Hip ABduction 4/5   Left Hip ADduction 4+/5   Right Knee Flexion 4+/5   Right Knee Extension 5/5   Left Knee Flexion 4+/5   Left Knee Extension 4+/5   Right Ankle Dorsiflexion 5/5   Right Ankle Plantar Flexion 4/5   Right Ankle Inversion 5/5   Right Ankle Eversion 5/5   Left Ankle Dorsiflexion 5/5   Left Ankle Plantar Flexion 4/5   Left Ankle Inversion 5/5   Left Ankle Eversion 5/5   Transfers   Transfers Sit to Stand;Stand to Sit   Sit to Stand 7: Independent   Stand to Sit 7: Independent   Ambulation/Gait   Ambulation/Gait Yes   Ambulation/Gait Assistance 5: Supervision   Ambulation Distance (Feet) 226 Feet   Assistive device None   Gait Pattern Decreased arm swing - right;Decreased arm swing - left   Ambulation Surface Level   Dynamic Gait Index   Level Surface Normal   Change in Gait Speed Mild Impairment   Gait with Horizontal Head Turns Normal   Gait with Vertical Head Turns Mild Impairment   Gait and Pivot Turn Normal   Step Over Obstacle Mild Impairment   Step Around Obstacles Normal   Steps Normal   Total Score 21   DGI comment: completed without an AD   Timed Up and Go Test   Normal TUG (seconds) 14   TUG Comments without an AD            Vestibular Assessment - 05/27/15 0001    Symptom Behavior   Type of Dizziness Unsteady with head/body turns   Frequency of Dizziness 1x/week   Duration of Dizziness less than 1 minute   Aggravating Factors Sit to stand  ambulating     Relieving Factors Slow movements;Head stationary;Rest   Occulomotor Exam   Occulomotor Alignment Normal   Spontaneous Absent   Gaze-induced Absent   Head shaking Horizontal Absent   Head Shaking Vertical Absent   Smooth Pursuits Intact   Saccades Intact   Comment Head thrust   (+)  positive for catch up saccade when completed to the R   Other Tests   Tragal Intact    Comments Finger to nose and heel to shin coordination tests  Intact   Positional Testing   Dix-Hallpike Dix-Hallpike Right;Dix-Hallpike Left   Sidelying Test Sidelying Right;Sidelying Left   Horizontal Canal Testing Horizontal Canal Right;Horizontal Canal Left   Dix-Hallpike Right   Dix-Hallpike Right Duration 0   Dix-Hallpike Right Symptoms No nystagmus   Dix-Hallpike Left   Dix-Hallpike Left Duration 0   Dix-Hallpike Left Symptoms No nystagmus   Sidelying Right   Sidelying Right Duration 0   Sidelying Right Symptoms No nystagmus   Sidelying Left   Sidelying Left Duration 0   Sidelying Left Symptoms No nystagmus   Horizontal Canal Right   Horizontal Canal Right Duration 0   Horizontal Canal Right Symptoms Normal   Horizontal Canal Left   Horizontal Canal Left Duration 0   Horizontal Canal Left Symptoms Normal   Cognition   Cognition Orientation Level Oriented to person;Oriented to place;Oriented to situation   Positional Sensitivities   Sit to Supine No dizziness   Supine to Left Side No dizziness   Supine to Right Side No dizziness   Supine to Sitting No dizziness   Right Hallpike No dizziness   Up from Right Hallpike No dizziness   Up from Left Hallpike No dizziness   Nose to Right Knee No dizziness   Right Knee to Sitting No dizziness   Nose to Left Knee No dizziness   Left Knee to Sitting No dizziness   Head Turning x 5 No dizziness   Head Nodding x 5 No dizziness   Pivot Right in Standing No dizziness   Pivot Left in Standing No dizziness   Rolling Right No dizziness   Rolling Left No  dizziness                       PT Education - 05/27/15 0859    Education provided Yes   Education Details Educated pt and spouse on fall precautions, proper fitting shoe wear, and PT eval findings    Person(s) Educated Patient;Spouse   Methods Explanation   Comprehension Verbalized understanding          PT Short Term Goals - 05/27/15 0918    PT SHORT TERM GOAL #1   Title Patient will report less frequent dizziness from 1x/week to 1x/every other week in order to return to her PLOF.    Time 3   Period Weeks   Status New   PT SHORT TERM GOAL #2   Title Patient and caregiver will independently verbalize 4/4 fall precautions in order to reduce the risk for falls.    Time 3   Period Weeks   Status New   PT SHORT TERM GOAL #3   Title Patient and spouse will independently demo full understanding with initial VOR/ habituation HEP with use of handouts.    Time 2   Period Weeks   Status New           PT Long Term Goals - 05/27/15 0921    PT LONG TERM GOAL #1   Title Patient will improve DGI score to 24/24 in order to reduce the risk for falls.    Time 6   Period Weeks   Status New   PT LONG TERM GOAL #2   Title Patient's TUG time will improve to <13 sec without the use of an AD  in order to reduce the risk for falls.    Time 5   Period Weeks   Status New   PT LONG TERM GOAL #3   Title Patient will report decreased dizziness intensity to <3/10 on a VAS in order to improve tolerance with ADLs and leisure activities.    Time 6   Period Weeks   Status New   PT LONG TERM GOAL #4   Title Patient will improve B hip strength to 5/5 MMT in order to improve pelvic stabilization with community ambulation.    Time 6   Period Weeks   Status New   PT LONG TERM GOAL #5   Title Patient and spouse will independently demo full understanding with habituation/VOR HEP in order to ensure proper completion prior to DC from PT.    Time 6   Period Weeks   Status New                Plan - 05/31/2015 0902    Clinical Impression Statement Tonya Cortez is a 75 yo female who was referred to outpatient PT with hx of vertigo and unsteady gait. The pt presents with signs and symptoms that are consistent with R unilateral vestibular hypofunction that was evident with observable catch up saccades with R head thrust. No signs or symptoms of dizziness or nystagmus assessed with Dix Hall-pike test, roll test, and Gaze-induced/spontaneous nystagmus test.  Pt demo minor balance deficits and unsteady gait with head turns while ambulating, but pt was able to regain her balance independently. DGI score was 21/24 and TUG score was 14 sec without an AD, which indicates risk for falls. No CNS signs assessed this visit. Furthermore, orthostatic hypotension was negative upon assessment with sit to stand transfer. The pt would benefit from skilled PT to address current impairments and limitations including unsteady gait and dizziness associated with R unilateral vestibular hypofunction in order to reduce dizziness and reduce the risk for falls with community ambulation and leisure activities.    Pt will benefit from skilled therapeutic intervention in order to improve on the following deficits Abnormal gait;Decreased safety awareness;Dizziness;Decreased balance;Improper body mechanics;Postural dysfunction;Decreased strength   Rehab Potential Good   Clinical Impairments Affecting Rehab Potential pre-existing HA and vertigo; also early stages of dementia per husband    PT Frequency 2x / week   PT Duration 6 weeks   PT Treatment/Interventions ADLs/Self Care Home Management;Balance training;Therapeutic exercise;Therapeutic activities;Functional mobility training;Stair training;Gait training;DME Instruction;Neuromuscular re-education;Patient/family education;Manual techniques;Passive range of motion;Vestibular   PT Next Visit Plan Next visit to focus on dynamic standing/gait balance training  and  VOR x1 /VOR x2 ther ex.    PT Home Exercise Plan HEP not initiated this visit. Will plan to initiate at next PT visit.    Recommended Other Services None at this time    Consulted and Agree with Plan of Care Patient;Family member/caregiver   Family Member Consulted Spouse          G-Codes - May 31, 2015 [id number]    Functional Assessment Tool Used Per FOTO    Functional Limitation Mobility: Walking and moving around   Mobility: Walking and Moving Around Current Status [phone]) At least 20 percent but less than 40 percent impaired, limited or restricted   Mobility: Walking and Moving Around Goal Status [phone]) At least 1 percent but less than 20 percent impaired, limited or restricted       Problem List Patient Active Problem List   Diagnosis Date Noted  . Adenomatous polyp 01/02/2014  Garen Lah, PT, DPT   05/27/2015, 1:35 PM  [place] [address] [place], [place], [address] Phone: [phone]   Fax:  [phone]  Name: Tonya Cortez MRN: [id number] Date of Birth: 1940-08-29

## 2015-05-29 ENCOUNTER — Ambulatory Visit (HOSPITAL_COMMUNITY): Payer: Medicare Other

## 2015-05-29 DIAGNOSIS — R29898 Other symptoms and signs involving the musculoskeletal system: Secondary | ICD-10-CM

## 2015-05-29 DIAGNOSIS — R293 Abnormal posture: Secondary | ICD-10-CM | POA: Diagnosis not present

## 2015-05-29 DIAGNOSIS — H832X1 Labyrinthine dysfunction, right ear: Secondary | ICD-10-CM | POA: Diagnosis not present

## 2015-05-29 DIAGNOSIS — M542 Cervicalgia: Secondary | ICD-10-CM | POA: Diagnosis not present

## 2015-05-29 DIAGNOSIS — R2681 Unsteadiness on feet: Secondary | ICD-10-CM | POA: Diagnosis not present

## 2015-05-29 DIAGNOSIS — G729 Myopathy, unspecified: Secondary | ICD-10-CM | POA: Diagnosis not present

## 2015-05-29 DIAGNOSIS — M436 Torticollis: Secondary | ICD-10-CM | POA: Diagnosis not present

## 2015-05-29 NOTE — Therapy (Signed)
[place], [place], [address] Phone: [phone]   Fax:  [phone]  Physical Therapy Treatment  Patient Details  Name: Tonya Cortez MRN: [id number] Date of Birth: 1940-06-11 Referring Provider: Dr. Lucianne Lei  Encounter Date: 05/29/2015      PT End of Session - 05/29/15 1033    Visit Number 2   Number of Visits 13   Date for PT Re-Evaluation 06/26/15   Authorization Type Medicare/[employer]    Authorization Time Period 05/26/2012 to 07/08/2015   PT Start Time 1022   PT Stop Time 1103   PT Time Calculation (min) 41 min   Equipment Utilized During Treatment Gait belt   Activity Tolerance Patient tolerated treatment well   Behavior During Therapy [facility] for tasks assessed/performed      Past Medical History  Diagnosis Date  . Bell's palsy   . Arthritis   . Hypercholesterolemia   . Borderline diabetes   . Cancer [facility])     Past Surgical History  Procedure Laterality Date  . Abdominal hysterectomy    . Breast surgery    . Mastectomy Left     abnormal mass in breast, non-cancerous per patient.   . Colonoscopy  10/29/2008    [id number] examination with some left-sided diverticula/Diminutive cecal polyp, status post cold biopsy removal.  Right colonic mucosa appeared normal. adenomatous  . Colonoscopy N/A 01/17/2014    Procedure: COLONOSCOPY;  Surgeon: Daneil Dolin, MD;  [facility];  Service: Endoscopy;  Laterality: N/A;  1115  . Mastectomy      There were no vitals filed for this visit.  Visit Diagnosis:  Vestibular hypofunction of right ear  Unsteadiness on feet  Unsteady gait  Weakness of both lower extremities      Subjective Assessment - 05/29/15 1027    Subjective Pt denied pain or dizziness upon arrival and noted " I feel fine today". No new complaints reported.    Patient is accompained by: Family member   Pertinent History hx of neck pain and Bell's Palsy    How  long can you sit comfortably? No difficulty    How long can you stand comfortably? No difficulty    How long can you walk comfortably? No difficulty    Patient Stated Goals Pt's goal is to reduce her dizziness and improve her balance while ambulating    Currently in Pain? No/denies                          Vestibular Treatment/Exercise - 05/29/15 0001    Vestibular Treatment/Exercise   Gaze Exercises X1 Viewing Horizontal   X1 Viewing Horizontal   Foot Position shoulder width apart    Time --  1 minute    Reps 4   Comments Standing; 45 sec rest breaks required between trials            Balance Exercises - 05/29/15 1216    Balance Exercises: Standing   Rockerboard EO;Anterior/posterior;Lateral  x 2 sets of 1 minute in each direction    Gait with Head Turns Forward;Other (comment)  x5 minutes    Tandem Gait Forward;Intermittent upper extremity support;3 reps   Step Over Hurdles / Cones Figure-8 walking around 2 cones x 5 minutes; Stepping over 3, 12" hurdles without an AD   Overall Comments Other (comment)  close supervision to CGA x 1  PT Education - 05/29/15 1032    Education provided Yes   Education Details Educated pt and spouse on fall precautions and PT eval findings   Person(s) Educated Patient;Spouse   Methods Explanation;Verbal cues;Demonstration   Comprehension Verbalized understanding;Returned demonstration;Need further instruction          PT Short Term Goals - 05/27/15 [id number]    PT SHORT TERM GOAL #1   Title Patient will report less frequent dizziness from 1x/week to 1x/every other week in order to return to her PLOF.    Time 3   Period Weeks   Status New   PT SHORT TERM GOAL #2   Title Patient and caregiver will independently verbalize 4/4 fall precautions in order to reduce the risk for falls.    Time 3   Period Weeks   Status New   PT SHORT TERM GOAL #3   Title Patient and spouse will independently demo full  understanding with initial VOR/ habituation HEP with use of handouts.    Time 2   Period Weeks   Status New           PT Long Term Goals - 05/27/15 0921    PT LONG TERM GOAL #1   Title Patient will improve DGI score to 24/24 in order to reduce the risk for falls.    Time 6   Period Weeks   Status New   PT LONG TERM GOAL #2   Title Patient's TUG time will improve to <13 sec without the use of an AD in order to reduce the risk for falls.    Time 5   Period Weeks   Status New   PT LONG TERM GOAL #3   Title Patient will report decreased dizziness intensity to <3/10 on a VAS in order to improve tolerance with ADLs and leisure activities.    Time 6   Period Weeks   Status New   PT LONG TERM GOAL #4   Title Patient will improve B hip strength to 5/5 MMT in order to improve pelvic stabilization with community ambulation.    Time 6   Period Weeks   Status New   PT LONG TERM GOAL #5   Title Patient and spouse will independently demo full understanding with habituation/VOR HEP in order to ensure proper completion prior to DC from PT.    Time 6   Period Weeks   Status New               Plan - 05/29/15 1034    Clinical Impression Statement Initiated VOR x 1 this visit with fair tolerance reported. Pt c/o dizziness that was rated a 5/10 after completing each trial with VOR x 1. Symptoms reduced to 0/10 after ~ 45 seconds. Verbal, tactile, and visual cues required for proper VOR x 1 technique and parameters. VOR x 1 ther ex was not added to HEP due to poor pt understanding. Will reassess understanding with VOR x 1 exercise at future visits and prescribe when appropriate. Initiated standing dynamic balance training as documented. Pt presented with unsteady balance with head turns with forward walking with min assist required on one occasion to regain balance. LOB also noted with quick turns, but pt was able to regain her balance independently. Dizziness was rated a 0/10 on a VAS at  the completion of today's PT visit. Continue with gaze stabilization and habituation ther ex, and progress with dynamic standing balance activities at next visit.    Pt will benefit  from skilled therapeutic intervention in order to improve on the following deficits Abnormal gait;Decreased safety awareness;Dizziness;Decreased balance;Improper body mechanics;Postural dysfunction;Decreased strength   Rehab Potential Good   Clinical Impairments Affecting Rehab Potential pre-existing HA and vertigo; also early stages of dementia per husband    PT Frequency 2x / week   PT Duration 6 weeks   PT Treatment/Interventions ADLs/Self Care Home Management;Balance training;Therapeutic exercise;Therapeutic activities;Functional mobility training;Stair training;Gait training;DME Instruction;Neuromuscular re-education;Patient/family education;Manual techniques;Passive range of motion;Vestibular   PT Next Visit Plan Next visit to focus on dynamic standing/gait balance training and  VOR x1 /VOR x2 ther ex.    PT Home Exercise Plan VOR x1 not added to HEP this visit secondary to poor recall and understanding demo by pt. Will attempt to give VOR x 1 as part of her HEP at next PT visit with understanding improves   Consulted and Agree with Plan of Care Patient;Family member/caregiver   Family Member Consulted Spouse        Problem List Patient Active Problem List   Diagnosis Date Noted  . Adenomatous polyp 01/02/2014    Garen Lah, PT, DPT  05/29/2015, 12:21 PM  [facility] [address] [place], [place], [address] Phone: [phone]   Fax:  [phone]  Name: TRUBY KILSON MRN: [id number] Date of Birth: [birth year]

## 2015-06-03 ENCOUNTER — Ambulatory Visit (HOSPITAL_COMMUNITY): Payer: Medicare Other

## 2015-06-03 DIAGNOSIS — M791 Myalgia: Secondary | ICD-10-CM | POA: Diagnosis not present

## 2015-06-03 DIAGNOSIS — R293 Abnormal posture: Secondary | ICD-10-CM | POA: Diagnosis not present

## 2015-06-03 DIAGNOSIS — G729 Myopathy, unspecified: Secondary | ICD-10-CM | POA: Diagnosis not present

## 2015-06-03 DIAGNOSIS — M436 Torticollis: Secondary | ICD-10-CM | POA: Diagnosis not present

## 2015-06-03 DIAGNOSIS — R2681 Unsteadiness on feet: Secondary | ICD-10-CM

## 2015-06-03 DIAGNOSIS — R29898 Other symptoms and signs involving the musculoskeletal system: Secondary | ICD-10-CM

## 2015-06-03 DIAGNOSIS — M542 Cervicalgia: Secondary | ICD-10-CM | POA: Diagnosis not present

## 2015-06-03 DIAGNOSIS — H832X1 Labyrinthine dysfunction, right ear: Secondary | ICD-10-CM | POA: Diagnosis not present

## 2015-06-03 DIAGNOSIS — G518 Other disorders of facial nerve: Secondary | ICD-10-CM | POA: Diagnosis not present

## 2015-06-03 DIAGNOSIS — R51 Headache: Secondary | ICD-10-CM | POA: Diagnosis not present

## 2015-06-03 NOTE — Therapy (Signed)
[place], [place], [address] Phone: [phone]   Fax:  [phone]  Physical Therapy Treatment  Patient Details  Name: Tonya [facility] MRN: [id number] Date of Birth: 04/15/1940 Referring Provider: Dr. Lucianne Lei  Encounter Date: 06/03/2015      PT End of Session - 06/03/15 1437    Visit Number 3   Number of Visits 13   Date for PT Re-Evaluation 06/26/15   Authorization Type Medicare/[employer]    Authorization Time Period 05/26/2012 to 07/08/2015   PT Start Time 1431   PT Stop Time 1518   PT Time Calculation (min) 47 min   Equipment Utilized During Treatment Gait belt   Activity Tolerance Patient tolerated treatment well   Behavior During Therapy [facility] for tasks assessed/performed      Past Medical History  Diagnosis Date  . Bell's palsy   . Arthritis   . Hypercholesterolemia   . Borderline diabetes   . Cancer [facility])     Past Surgical History  Procedure Laterality Date  . Abdominal hysterectomy    . Breast surgery    . Mastectomy Left     abnormal mass in breast, non-cancerous per patient.   . Colonoscopy  10/29/2008    [id number] examination with some left-sided diverticula/Diminutive cecal polyp, status post cold biopsy removal.  Right colonic mucosa appeared normal. adenomatous  . Colonoscopy N/A 01/17/2014    Procedure: COLONOSCOPY;  Surgeon: Daneil Dolin, MD;  [facility];  Service: Endoscopy;  Laterality: N/A;  1115  . Mastectomy      There were no vitals filed for this visit.  Visit Diagnosis:  Vestibular hypofunction of right ear  Unsteadiness on feet  Unsteady gait  Weakness of both lower extremities      Subjective Assessment - 06/03/15 1434    Subjective Pt reported minor dizziness upon arrival that was rated a 5/10 upon arrival. Pt denied falls since last PT visit.    Patient is accompained by: Family member   Pertinent History hx of neck pain and Bell's  Palsy    Limitations Walking   How long can you sit comfortably? No difficulty    How long can you stand comfortably? No difficulty    How long can you walk comfortably? No difficulty    Patient Stated Goals Pt's goal is to reduce her dizziness and improve her balance while ambulating    Currently in Pain? No/denies                          Vestibular Treatment/Exercise - 06/03/15 0001    Vestibular Treatment/Exercise   Gaze Exercises X1 Viewing Horizontal;X2 Viewing Horizontal   X1 Viewing Horizontal   Foot Position shoulder width apart    Time 0100  minute    Reps 4   Comments standing   X2 Viewing Horizontal   Foot Position    Time 0100  minute    Reps 3    Comments seated            Balance Exercises - 06/03/15 1512    Balance Exercises: Standing   Rockerboard EO;Anterior/posterior;Lateral;Other (comment)  x 2 minutes each direction with intermittent UE assist    Gait with Head Turns Forward;Other (comment)  x 5 time minutes; with addition of quick stops/turns   Tandem Gait Forward;Intermittent upper extremity support;3 reps   Step Over Hurdles / Cones Figure-8 walking around 2 cones x  5 minutes; Stepping forwards and laterally over 4, hurdles without an AD x 8 sets   Overall Comments Other (comment)  close supervision to [place] x 1           PT Education - 06/03/15 1436    Education provided Yes   Education Details Educated pt and spouse on 4/4 fall precautions and VOR ther ex technique   Person(s) Educated Patient;Spouse   Methods Explanation;Demonstration;Verbal cues   Comprehension Verbalized understanding;Returned demonstration          PT Short Term Goals - 05/27/15 0918    PT SHORT TERM GOAL #1   Title Patient will report less frequent dizziness from 1x/week to 1x/every other week in order to return to her PLOF.    Time 3   Period Weeks   Status New   PT SHORT TERM GOAL #2   Title Patient and caregiver will independently  verbalize 4/4 fall precautions in order to reduce the risk for falls.    Time 3   Period Weeks   Status New   PT SHORT TERM GOAL #3   Title Patient and spouse will independently demo full understanding with initial VOR/ habituation HEP with use of handouts.    Time 2   Period Weeks   Status New           PT Long Term Goals - 05/27/15 0921    PT LONG TERM GOAL #1   Title Patient will improve DGI score to 24/24 in order to reduce the risk for falls.    Time 6   Period Weeks   Status New   PT LONG TERM GOAL #2   Title Patient's TUG time will improve to <13 sec without the use of an AD in order to reduce the risk for falls.    Time 5   Period Weeks   Status New   PT LONG TERM GOAL #3   Title Patient will report decreased dizziness intensity to <3/10 on a VAS in order to improve tolerance with ADLs and leisure activities.    Time 6   Period Weeks   Status New   PT LONG TERM GOAL #4   Title Patient will improve B hip strength to 5/5 MMT in order to improve pelvic stabilization with community ambulation.    Time 6   Period Weeks   Status New   PT LONG TERM GOAL #5   Title Patient and spouse will independently demo full understanding with habituation/VOR HEP in order to ensure proper completion prior to DC from PT.    Time 6   Period Weeks   Status New               Plan - 06/03/15 1437    Clinical Impression Statement Initiated PT visit with standing dynamic balance activities as documented. Unsteady gait assessed with quick turns and stops with LOB assessed on 3 occasions with min assist required to regain balance. Improved weight shifting assessed with A<>P and M<>L weight shifting on rocker board. Verbal and tactile cues provided to improve postural awareness and use of ankle strategy. Reviewed and completed VOR x 1 with addition of VOR x 2 ther ex. Pt demo poor recall from last PT visit with verbal and visual cues required for proper head position and technique with  VOR x 1 ther ex. Pt c/o dizziness that was rated a 2/10 after completing each trial with VOR x 1. Symptoms reduced to 0/10 after ~ 30 seconds.  Verbal and tactile cues required for proper completion of VOR x2 ther ex. Dizziness was reduced to 0/10 at the completion of today's PT tx. HEP was not updated this visit with VOR x1 and 2 due to poor pt understanding. Wil plan to educate spouse at next visit. Continue with current POC with focus on gaze stabilization ther ex and dynamic standing balance activities.    Pt will benefit from skilled therapeutic intervention in order to improve on the following deficits Abnormal gait;Decreased safety awareness;Dizziness;Decreased balance;Improper body mechanics;Postural dysfunction;Decreased strength   Rehab Potential Good   Clinical Impairments Affecting Rehab Potential pre-existing HA and vertigo; also early stages of dementia per husband    PT Frequency 2x / week   PT Duration 6 weeks   PT Treatment/Interventions ADLs/Self Care Home Management;Balance training;Therapeutic exercise;Therapeutic activities;Functional mobility training;Stair training;Gait training;DME Instruction;Neuromuscular re-education;Patient/family education;Manual techniques;Passive range of motion;Vestibular   PT Next Visit Plan Next visit to focus on dynamic standing/gait balance training and  VOR x1 /VOR x2 ther ex.    PT Home Exercise Plan VOR x1 not added to HEP this visit secondary to poor recall and understanding demo by pt. Will attempt to give VOR x 1 as part of her HEP at next PT visit if understanding improves   Consulted and Agree with Plan of Care Patient;Family member/caregiver   Family Member Consulted Spouse        Problem List Patient Active Problem List   Diagnosis Date Noted  . Adenomatous polyp 01/02/2014    Garen Lah, PT, DPT  06/03/2015, 4:05 PM  [place] [address] [place], [place], [address] Phone:  [phone]   Fax:  [phone]  Name: BRITTIE OSTERLUND MRN: [id number] Date of Birth: 1941/01/30

## 2015-06-05 ENCOUNTER — Encounter (HOSPITAL_COMMUNITY): Payer: Self-pay

## 2015-06-05 ENCOUNTER — Ambulatory Visit (HOSPITAL_COMMUNITY): Payer: Medicare Other | Attending: Family Medicine

## 2015-06-05 DIAGNOSIS — H832X1 Labyrinthine dysfunction, right ear: Secondary | ICD-10-CM | POA: Diagnosis not present

## 2015-06-05 DIAGNOSIS — R293 Abnormal posture: Secondary | ICD-10-CM | POA: Diagnosis not present

## 2015-06-05 DIAGNOSIS — R29898 Other symptoms and signs involving the musculoskeletal system: Secondary | ICD-10-CM | POA: Insufficient documentation

## 2015-06-05 DIAGNOSIS — R2681 Unsteadiness on feet: Secondary | ICD-10-CM | POA: Diagnosis not present

## 2015-06-05 NOTE — Therapy (Signed)
[place], [place], [address] Phone: [phone]   Fax:  [phone]  Physical Therapy Treatment  Patient Details  Name: Tonya [place] MRN: [id number] Date of Birth: 03-04-1941 Referring Provider: Dr. Lucianne Lei  Encounter Date: 06/05/2015      PT End of Session - 06/05/15 0939    Visit Number 4   Number of Visits 13   Date for PT Re-Evaluation 06/26/15   Authorization Type Medicare/[employer]    Authorization Time Period 05/26/2012 to 07/08/2015   PT Start Time 0933   PT Stop Time 1018   PT Time Calculation (min) 45 min   Equipment Utilized During Treatment Gait belt   Activity Tolerance Patient tolerated treatment well   Behavior During Therapy [facility] for tasks assessed/performed      Past Medical History  Diagnosis Date  . Bell's palsy   . Arthritis   . Hypercholesterolemia   . Borderline diabetes   . Cancer [facility])     Past Surgical History  Procedure Laterality Date  . Abdominal hysterectomy    . Breast surgery    . Mastectomy Left     abnormal mass in breast, non-cancerous per patient.   . Colonoscopy  10/29/2008    [id number] examination with some left-sided diverticula/Diminutive cecal polyp, status post cold biopsy removal.  Right colonic mucosa appeared normal. adenomatous  . Colonoscopy N/A 01/17/2014    Procedure: COLONOSCOPY;  Surgeon: Daneil Dolin, MD;  [facility];  Service: Endoscopy;  Laterality: N/A;  1115  . Mastectomy      There were no vitals filed for this visit.  Visit Diagnosis:  Unsteady gait  Unsteadiness on feet  Weakness of both lower extremities  Vestibular hypofunction of right ear      Subjective Assessment - 06/05/15 0937    Subjective Pt reported minor dizziness upon arrival that was rated a 4/10 upon arrival. Pt denied falls or pain since last PT visit. No changes reported with her dizziness since beginning with PT.    Patient is  accompained by: Family member   Pertinent History hx of neck pain and Bell's Palsy    Limitations Walking   How long can you sit comfortably? No difficulty    How long can you stand comfortably? No difficulty    How long can you walk comfortably? No difficulty    Patient Stated Goals Pt's goal is to reduce her dizziness and improve her balance while ambulating    Currently in Pain? No/denies                          Vestibular Treatment/Exercise - 06/05/15 0001    Vestibular Treatment/Exercise   Gaze Exercises X1 Viewing Horizontal;X2 Viewing Horizontal   X1 Viewing Horizontal   Foot Position shoulder width apart    Time 0100  minute    Reps 4   Comments standing   X2 Viewing Horizontal   Foot Position N/A= seated    Time 0100  minute    Reps 2    Comments seated            Balance Exercises - 06/05/15 0959    Balance Exercises: Standing   Rockerboard EO;Anterior/posterior;Lateral;Other (comment)  x  2 minutes each direction with intermittent UE assist    Gait with Head Turns Forward;Other (comment)  x 5 time minutes; with addition of quick stops/turns   Tandem Gait Forward;Intermittent upper extremity  support;Foam/compliant surface;5 reps;Retro  5 laps on even ground and 5 laps on airex beam    Sidestepping Other (comment)  x 3 sets of 8 steps, bilaterally, without UE support   Turning Other (comment)  ambulation around clinic with quick turns/stops/starts    Overall Comments Other (comment)  close supervision to [place] x 1           PT Education - 06/05/15 1005    Education provided Yes   Education Details educated pt and spouse on VOR x 1 ther ex    Person(s) Educated Patient;Spouse   Methods Explanation;Demonstration;Handout   Comprehension Verbalized understanding;Returned demonstration;Need further instruction  Understanding demo by spouse only          PT Short Term Goals - 05/27/15 [id number]    PT SHORT TERM GOAL #1   Title Patient  will report less frequent dizziness from 1x/week to 1x/every other week in order to return to her PLOF.    Time 3   Period Weeks   Status New   PT SHORT TERM GOAL #2   Title Patient and caregiver will independently verbalize 4/4 fall precautions in order to reduce the risk for falls.    Time 3   Period Weeks   Status New   PT SHORT TERM GOAL #3   Title Patient and spouse will independently demo full understanding with initial VOR/ habituation HEP with use of handouts.    Time 2   Period Weeks   Status New           PT Long Term Goals - 05/27/15 0921    PT LONG TERM GOAL #1   Title Patient will improve DGI score to 24/24 in order to reduce the risk for falls.    Time 6   Period Weeks   Status New   PT LONG TERM GOAL #2   Title Patient's TUG time will improve to <13 sec without the use of an AD in order to reduce the risk for falls.    Time 5   Period Weeks   Status New   PT LONG TERM GOAL #3   Title Patient will report decreased dizziness intensity to <3/10 on a VAS in order to improve tolerance with ADLs and leisure activities.    Time 6   Period Weeks   Status New   PT LONG TERM GOAL #4   Title Patient will improve B hip strength to 5/5 MMT in order to improve pelvic stabilization with community ambulation.    Time 6   Period Weeks   Status New   PT LONG TERM GOAL #5   Title Patient and spouse will independently demo full understanding with habituation/VOR HEP in order to ensure proper completion prior to DC from PT.    Time 6   Period Weeks   Status New               Plan - 06/05/15 0940    Clinical Impression Statement PT tx focused on gaze stabilization ther ex and dynamic standing balance activities. Progressed tandem walking on airex beam. Occasional LOB assessed with unilateral UE support required.  Reviewed and completed VOR x 1 and VOR x 2 ther ex with poor recall demo by pt. Verbal and visual cues required for proper head position and technique  with VOR x 1 and VOR x 2 ther ex. Pt c/o dizziness that was rated a 4/10 after completing each trial with VOR x 1. Symptoms reduced to 0/10  after ~ 30 seconds. Added VOR x 1 to HEP with instructions provided to spouse due to poor pt memory and recall. Spouse was able to demo understanding of provided HEP with teach back method. Dizziness was reduced to 0/10 at the completion of today's PT tx. Continue with current POC with focus on gaze stabilization ther ex and dynamic standing balance activities.     Pt will benefit from skilled therapeutic intervention in order to improve on the following deficits Abnormal gait;Decreased safety awareness;Dizziness;Decreased balance;Improper body mechanics;Postural dysfunction;Decreased strength   Rehab Potential Good   Clinical Impairments Affecting Rehab Potential pre-existing HA and vertigo; also early stages of dementia per husband    PT Frequency 2x / week   PT Duration 6 weeks   PT Treatment/Interventions ADLs/Self Care Home Management;Balance training;Therapeutic exercise;Therapeutic activities;Functional mobility training;Stair training;Gait training;DME Instruction;Neuromuscular re-education;Patient/family education;Manual techniques;Passive range of motion;Vestibular   PT Next Visit Plan Next visit to focus on dynamic standing/gait balance training and  VOR x1 /VOR x2 ther ex.    PT Home Exercise Plan VOR x1 in seated position added to HEP this visit with instructions provided to pt and spouse to improve complaince and understanding; Review VOR x 1 ther ex with pt and spouse at next PT visit   Consulted and Agree with Plan of Care Patient;Family member/caregiver   Family Member Consulted Spouse        Problem List Patient Active Problem List   Diagnosis Date Noted  . Adenomatous polyp 01/02/2014    Garen Lah, PT, DPT  06/05/2015, 11:09 AM  [place] [address] [place], [place], [address] Phone:  [phone]   Fax:  [phone]  Name: Tonya [place] MRN: [id number] Date of Birth: [birth year]

## 2015-06-05 NOTE — Patient Instructions (Signed)
   VOR x 1 viewing  - horizontal   Hold a card with a small letter on it in front of you. Focus on one small letter. Turn your head left and right, keeping your eyes focused on the letter. Attempt to turn your head left/right as quickly as possible WHILE keeping the letter in focus. If the letter becomes blurry, slow down.   Complete 3 sets of 1 minute in a seated position, complete 1 session a day

## 2015-06-10 ENCOUNTER — Ambulatory Visit (HOSPITAL_COMMUNITY): Payer: Medicare Other

## 2015-06-10 DIAGNOSIS — R29898 Other symptoms and signs involving the musculoskeletal system: Secondary | ICD-10-CM | POA: Diagnosis not present

## 2015-06-10 DIAGNOSIS — H832X1 Labyrinthine dysfunction, right ear: Secondary | ICD-10-CM

## 2015-06-10 DIAGNOSIS — R2681 Unsteadiness on feet: Secondary | ICD-10-CM

## 2015-06-10 DIAGNOSIS — R293 Abnormal posture: Secondary | ICD-10-CM | POA: Diagnosis not present

## 2015-06-10 NOTE — Therapy (Signed)
[place], [place], [address] Phone: [phone]   Fax:  [phone]  Physical Therapy Treatment  Patient Details  Name: Tonya Cortez MRN: [id number] Date of Birth: Mar 17, 1941 Referring Provider: Dr. Lucianne Lei  Encounter Date: 06/10/2015      PT End of Session - 06/10/15 0841    Visit Number 5   Number of Visits 13   Date for PT Re-Evaluation 06/26/15   Authorization Type Medicare/[employer]    Authorization Time Period 05/26/2012 to 07/08/2015   PT Start Time 0837   PT Stop Time 0928   PT Time Calculation (min) 51 min   Equipment Utilized During Treatment Gait belt   Activity Tolerance Patient tolerated treatment well   Behavior During Therapy [facility] for tasks assessed/performed      Past Medical History  Diagnosis Date  . Bell's palsy   . Arthritis   . Hypercholesterolemia   . Borderline diabetes   . Cancer [facility])     Past Surgical History  Procedure Laterality Date  . Abdominal hysterectomy    . Breast surgery    . Mastectomy Left     abnormal mass in breast, non-cancerous per patient.   . Colonoscopy  10/29/2008    [id number] examination with some left-sided diverticula/Diminutive cecal polyp, status post cold biopsy removal.  Right colonic mucosa appeared normal. adenomatous  . Colonoscopy N/A 01/17/2014    Procedure: COLONOSCOPY;  Surgeon: Daneil Dolin, MD;  [facility];  Service: Endoscopy;  Laterality: N/A;  1115  . Mastectomy      There were no vitals filed for this visit.  Visit Diagnosis:  Unsteady gait  Unsteadiness on feet  Weakness of both lower extremities  Vestibular hypofunction of right ear      Subjective Assessment - 06/10/15 0837    Subjective Pt reported denied dizziness upon arrival that was rated a 0/10and further noted no falls since last PT visit. Pt noted that she has been completing her HEP for her dizziness, but was unable to recall her VOR  x 1 exercise.    Patient is accompained by: Family member   Pertinent History hx of neck pain and Bell's Palsy    Currently in Pain? No/denies                          Vestibular Treatment/Exercise - 06/10/15 0001    Vestibular Treatment/Exercise   Gaze Exercises X1 Viewing Horizontal;X2 Viewing Horizontal   X1 Viewing Horizontal   Foot Position shoulder width apart    Time 0100  minute    Reps 4   Comments standing   X2 Viewing Horizontal   Foot Position N/A= seated    Time 0100  minute    Reps 3    Comments Seated; manual assist required to help coordinate head/hand movements in opposite directions             Balance Exercises - 06/10/15 0909    Balance Exercises: Standing   SLS Eyes open;Solid surface;4 reps;30 secs  bilateral    Rockerboard EO;Anterior/posterior;Lateral;Other (comment)  x  2 minutes each direction with intermittent UE assist    Gait with Head Turns Forward;Other (comment)  x 5 time minutes; with addition of quick stops/turns   Tandem Gait Forward;Intermittent upper extremity support;Foam/compliant surface;5 reps;Retro  6 laps on even ground and 6 laps on airex beam    Sidestepping Other (comment);Foam/compliant support  x  4 sets of 8 steps, bilaterally, without UE support   Turning Other (comment)  ambulation around clinic with quick turns/stops/starts    Step Over Hurdles / Cones Figure-8 walking around 4 hurdles  x 5 minutes; Stepping forwards and laterally over 4- 12 inch hurdles without an AD x 8 sets   Overall Comments Other (comment)  close supervision to CGA x 1           PT Education - 06/10/15 0840    Education provided Yes   Education Details Educated pt and spouse on VOR x 1 ther ex and 4/4 fall precatuions   Person(s) Educated Patient;Spouse   Methods Explanation;Demonstration;Verbal cues   Comprehension Verbalized understanding;Need further instruction;Returned demonstration          PT Short Term  Goals - 05/27/15 [phone]    PT SHORT TERM GOAL #1   Title Patient will report less frequent dizziness from 1x/week to 1x/every other week in order to return to her PLOF.    Time 3   Period Weeks   Status New   PT SHORT TERM GOAL #2   Title Patient and caregiver will independently verbalize 4/4 fall precautions in order to reduce the risk for falls.    Time 3   Period Weeks   Status New   PT SHORT TERM GOAL #3   Title Patient and spouse will independently demo full understanding with initial VOR/ habituation HEP with use of handouts.    Time 2   Period Weeks   Status New           PT Long Term Goals - 05/27/15 0921    PT LONG TERM GOAL #1   Title Patient will improve DGI score to 24/24 in order to reduce the risk for falls.    Time 6   Period Weeks   Status New   PT LONG TERM GOAL #2   Title Patient's TUG time will improve to <13 sec without the use of an AD in order to reduce the risk for falls.    Time 5   Period Weeks   Status New   PT LONG TERM GOAL #3   Title Patient will report decreased dizziness intensity to <3/10 on a VAS in order to improve tolerance with ADLs and leisure activities.    Time 6   Period Weeks   Status New   PT LONG TERM GOAL #4   Title Patient will improve B hip strength to 5/5 MMT in order to improve pelvic stabilization with community ambulation.    Time 6   Period Weeks   Status New   PT LONG TERM GOAL #5   Title Patient and spouse will independently demo full understanding with habituation/VOR HEP in order to ensure proper completion prior to DC from PT.    Time 6   Period Weeks   Status New               Plan - 06/10/15 [id number]    Clinical Impression Statement PT tx focused on gaze stabilization ther ex and dynamic standing balance activities. Progressed stepping over hurdles with all hurdles progressed from 6 inches to 12 inches. Good weight shifting and SLS transition assessed while stepping over hurdles. Pt would occasionally  hit the hurdle while the back leg while attempting to step over. Verbal cues required to ensure back LE clears completely while stepping over. Improved technique and performance assessed once cues provided. Improved SLS assessed that was measured as follows:  L SLS= 24 sec and R SLS=22 sec. Further improvements assessed with weight shifting while standing on rocker board in A<>P and M<>L direction. Pt was able to weight shift with fair to good control in A<>P direction with minimal to no UE support required. Two seated rest breaks required during balance training activities. Reviewed and completed VOR x 1 and VOR x 2 ther ex with improved recall demo by pt, but continues to require verbal cues for proper head position and technique with VOR x 1 and VOR x 2 ther ex. Pt c/o dizziness that was rated a 2/10 after completing each trial with VOR x 1. Symptoms reduced to 0/10 after ~ 30 seconds. Dizziness was rated a 0/10 at the completion of today's PT tx. Pt is making steady progress towards stated goals with less intense and frequent dizziness and improved standing dynamic balance. Continue with current POC with focus on gaze stabilization ther ex and dynamic standing balance activities.    Pt will benefit from skilled therapeutic intervention in order to improve on the following deficits Abnormal gait;Decreased safety awareness;Dizziness;Decreased balance;Improper body mechanics;Postural dysfunction;Decreased strength   Rehab Potential Good   Clinical Impairments Affecting Rehab Potential pre-existing HA and vertigo; also early stages of dementia per husband    PT Frequency 2x / week   PT Duration 6 weeks   PT Treatment/Interventions ADLs/Self Care Home Management;Balance training;Therapeutic exercise;Therapeutic activities;Functional mobility training;Stair training;Gait training;DME Instruction;Neuromuscular re-education;Patient/family education;Manual techniques;Passive range of motion;Vestibular   PT Next  Visit Plan Next visit to focus on dynamic standing/gait balance training and  VOR x1 /VOR x2 ther ex.    PT Home Exercise Plan Reviewed HEP including VOR x 1; Review VOR x 1 ther ex with pt and spouse at next PT visit   Recommended Other Services None at this time    Consulted and Agree with Plan of Care Patient;Family member/caregiver   Family Member Consulted Spouse        Problem List Patient Active Problem List   Diagnosis Date Noted  . Adenomatous polyp 01/02/2014    Garen Lah, PT, DPT  06/10/2015, 9:25 AM  [place], [place], [address] Phone: [phone]   Fax:  [phone]  Name: Tonya Cortez MRN: [id number] Date of Birth: 1940-10-12

## 2015-06-12 ENCOUNTER — Ambulatory Visit (HOSPITAL_COMMUNITY): Payer: Medicare Other

## 2015-06-12 DIAGNOSIS — R293 Abnormal posture: Secondary | ICD-10-CM | POA: Diagnosis not present

## 2015-06-12 DIAGNOSIS — H832X1 Labyrinthine dysfunction, right ear: Secondary | ICD-10-CM

## 2015-06-12 DIAGNOSIS — R2681 Unsteadiness on feet: Secondary | ICD-10-CM | POA: Diagnosis not present

## 2015-06-12 DIAGNOSIS — R29898 Other symptoms and signs involving the musculoskeletal system: Secondary | ICD-10-CM | POA: Diagnosis not present

## 2015-06-12 NOTE — Therapy (Signed)
[place], [place], [address] Phone: [phone]   Fax:  [phone]  Physical Therapy Treatment  Patient Details  Name: Tonya Cortez MRN: [id number] Date of Birth: March 22, 1941 Referring Provider: Dr. Lucianne Lei  Encounter Date: 06/12/2015      PT End of Session - 06/12/15 0902    Visit Number 6   Number of Visits 13   Date for PT Re-Evaluation 06/26/15   Authorization Type Medicare/[employer]    Authorization Time Period 05/26/2012 to 07/08/2015   PT Start Time 0846   PT Stop Time 0930   PT Time Calculation (min) 44 min   Equipment Utilized During Treatment Gait belt   Activity Tolerance Patient tolerated treatment well   Behavior During Therapy [facility] for tasks assessed/performed      Past Medical History  Diagnosis Date  . Bell's palsy   . Arthritis   . Hypercholesterolemia   . Borderline diabetes   . Cancer [facility])     Past Surgical History  Procedure Laterality Date  . Abdominal hysterectomy    . Breast surgery    . Mastectomy Left     abnormal mass in breast, non-cancerous per patient.   . Colonoscopy  10/29/2008    [id number] examination with some left-sided diverticula/Diminutive cecal polyp, status post cold biopsy removal.  Right colonic mucosa appeared normal. adenomatous  . Colonoscopy N/A 01/17/2014    Procedure: COLONOSCOPY;  Surgeon: Daneil Dolin, MD;  [facility];  Service: Endoscopy;  Laterality: N/A;  1115  . Mastectomy      There were no vitals filed for this visit.  Visit Diagnosis:  Unsteady gait  Unsteadiness on feet  Weakness of both lower extremities  Vestibular hypofunction of right ear      Subjective Assessment - 06/12/15 0852    Subjective Pt c/o minor dizziness upon arrival to clinic that was rated a 1-2/10. Pt denied falls or pain since her last PT visit. Pt reports that she is "steadier" while ambulating and that she is able to turn her head  quickly with less frequent dizziness. Pt reports that she is happy with her progress thus far.    Patient is accompained by: Family member   Pertinent History hx of neck pain and Bell's Palsy    Limitations Walking   How long can you sit comfortably? No difficulty    How long can you stand comfortably? No difficulty    How long can you walk comfortably? No difficulty    Patient Stated Goals Pt's goal is to reduce her dizziness and improve her balance while ambulating    Currently in Pain? No/denies                         [facility] Adult PT Treatment/Exercise - 06/12/15 0001    Exercises   Exercises Knee/Hip   Knee/Hip Exercises: Aerobic   Nustep 5 minutes on level 2 with LE only   Knee/Hip Exercises: Standing   Heel Raises Both;2 sets;10 reps;Other (comment)  with B UE support    Functional Squat 2 sets;10 reps   Functional Squat Limitations with B U E support          Vestibular Treatment/Exercise - 06/12/15 0001    Vestibular Treatment/Exercise   Gaze Exercises X1 Viewing Horizontal;X2 Viewing Horizontal   X1 Viewing Horizontal   Foot Position shoulder width apart    Time 0100  minute  Reps 4   Comments standing   X2 Viewing Horizontal   Foot Position N/A= seated    Time 0100  minute    Reps 3    Comments seated            Balance Exercises - 06/12/15 0924    Balance Exercises: Standing   SLS Eyes open;Solid surface;30 secs;3 reps  bilateral, 3 additional sets were completed on airex pad       Gait with Head Turns Forward;Other (comment)  x 5 time minutes; with addition of quick stops/turns   Tandem Gait Forward;Intermittent upper extremity support;Foam/compliant surface;5 reps;Retro  6 laps on even ground and 6 laps on airex beam    Sidestepping Other (comment);Foam/compliant support;Theraband  3 sets of 8 steps, bilaterally with red thera-band    Turning Other (comment)  ambulation around clinic with quick turns/stops/starts    Overall  Comments Other (comment)  close supervision to CGA x 1           PT Education - 06/12/15 0901    Education provided Yes   Education Details VOR x 1 HEP and 4/4 fall precautions   Person(s) Educated Patient;Spouse   Methods Explanation;Demonstration   Comprehension Verbalized understanding;Returned demonstration;Need further instruction          PT Short Term Goals - 05/27/15 [id number]    PT SHORT TERM GOAL #1   Title Patient will report less frequent dizziness from 1x/week to 1x/every other week in order to return to her PLOF.    Time 3   Period Weeks   Status New   PT SHORT TERM GOAL #2   Title Patient and caregiver will independently verbalize 4/4 fall precautions in order to reduce the risk for falls.    Time 3   Period Weeks   Status New   PT SHORT TERM GOAL #3   Title Patient and spouse will independently demo full understanding with initial VOR/ habituation HEP with use of handouts.    Time 2   Period Weeks   Status New           PT Long Term Goals - 05/27/15 0921    PT LONG TERM GOAL #1   Title Patient will improve DGI score to 24/24 in order to reduce the risk for falls.    Time 6   Period Weeks   Status New   PT LONG TERM GOAL #2   Title Patient's TUG time will improve to <13 sec without the use of an AD in order to reduce the risk for falls.    Time 5   Period Weeks   Status New   PT LONG TERM GOAL #3   Title Patient will report decreased dizziness intensity to <3/10 on a VAS in order to improve tolerance with ADLs and leisure activities.    Time 6   Period Weeks   Status New   PT LONG TERM GOAL #4   Title Patient will improve B hip strength to 5/5 MMT in order to improve pelvic stabilization with community ambulation.    Time 6   Period Weeks   Status New   PT LONG TERM GOAL #5   Title Patient and spouse will independently demo full understanding with habituation/VOR HEP in order to ensure proper completion prior to DC from PT.    Time 6    Period Weeks   Status New               Plan -  06/12/15 0902    Clinical Impression Statement PT tx focused on gaze stabilization ther ex and dynamic standing balance activities. Initiated PT tx with warm-up on the Nustep. Added functional squats, heel raises, and resistance with side steps in order to improve B LE strength with ambulation and transfers. Improved SLS assessed that was measured as follows: L SLS= 25 sec and R SLS=24 sec. Progressed SLS on airex pad with good tolerance reported. LOB assessed at 4 seconds while completing SLS on airex pad. Completed SLS on airex pad with intermittent hand lift from parallel bar to adjust challenge of activity. Two seated rest breaks required during balance training activities. Reviewed and completed VOR x 1 and VOR x 2 ther ex with improved recall demo by pt. Verbal cues required for correct speed with VOR x 1. Pt c/o dizziness that was rated a 2-3/10 after completing each trial with VOR x 1. Symptoms reduced to 0/10 after ~ 30 seconds. Dizziness was rated a 0/10 at the completion of today's PT tx. Pt is making steady progress towards stated goals with less intense and frequent dizziness and improved standing dynamic balance. Good tolerance reported with added strengthening ther ex with no issues encountered. Continue with current POC with focus on gaze stabilization ther ex, B LE functional strengthening, and dynamic standing balance activities.     Pt will benefit from skilled therapeutic intervention in order to improve on the following deficits Abnormal gait;Decreased safety awareness;Dizziness;Decreased balance;Improper body mechanics;Postural dysfunction;Decreased strength   Rehab Potential Good   Clinical Impairments Affecting Rehab Potential pre-existing HA and vertigo; also early stages of dementia per husband    PT Frequency 2x / week   PT Duration 6 weeks   PT Treatment/Interventions ADLs/Self Care Home Management;Balance  training;Therapeutic exercise;Therapeutic activities;Functional mobility training;Stair training;Gait training;DME Instruction;Neuromuscular re-education;Patient/family education;Manual techniques;Passive range of motion;Vestibular   PT Next Visit Plan Next visit to focus on dynamic standing/gait balance training and  VOR x1 /VOR x2 ther ex. Review and complete B LE functional strengthening   PT Home Exercise Plan Reviewed HEP including VOR x 1; Review VOR x 1 ther ex with pt and spouse at next PT visit   Consulted and Agree with Plan of Care Patient   Family Member Consulted Spouse        Problem List Patient Active Problem List   Diagnosis Date Noted  . Adenomatous polyp 01/02/2014    Garen Lah, PT, DPT   06/12/2015, 9:31 AM  [place] [address] [place], [place], [address] Phone: [phone]   Fax:  [phone]  Name: Tonya Cortez MRN: [id number] Date of Birth: 12-Sep-1940

## 2015-06-17 ENCOUNTER — Ambulatory Visit (HOSPITAL_COMMUNITY): Payer: Medicare Other | Admitting: Physical Therapy

## 2015-06-17 DIAGNOSIS — R29898 Other symptoms and signs involving the musculoskeletal system: Secondary | ICD-10-CM | POA: Diagnosis not present

## 2015-06-17 DIAGNOSIS — R293 Abnormal posture: Secondary | ICD-10-CM | POA: Diagnosis not present

## 2015-06-17 DIAGNOSIS — R2681 Unsteadiness on feet: Secondary | ICD-10-CM | POA: Diagnosis not present

## 2015-06-17 DIAGNOSIS — H832X1 Labyrinthine dysfunction, right ear: Secondary | ICD-10-CM | POA: Diagnosis not present

## 2015-06-17 NOTE — Therapy (Signed)
[place], [place], [address] Phone: [phone]   Fax:  [phone]  Physical Therapy Treatment  Patient Details  Name: Tonya Cortez MRN: [id number] Date of Birth: July 14, 1940 Referring Provider: Dr. Lucianne Lei  Encounter Date: 06/17/2015      PT End of Session - 06/17/15 0931    Visit Number 7   Number of Visits 13   Date for PT Re-Evaluation 06/26/15   Authorization Type Medicare/[employer]    Authorization Time Period 05/26/2012 to 07/08/2015   Authorization - Visit Number 10   Authorization - Number of Visits 18   PT Start Time 0845   PT Stop Time 0926   PT Time Calculation (min) 41 min   Equipment Utilized During Treatment Gait belt   Activity Tolerance Patient tolerated treatment well   Behavior During Therapy [facility] for tasks assessed/performed      Past Medical History  Diagnosis Date  . Bell's palsy   . Arthritis   . Hypercholesterolemia   . Borderline diabetes   . Cancer [facility])     Past Surgical History  Procedure Laterality Date  . Abdominal hysterectomy    . Breast surgery    . Mastectomy Left     abnormal mass in breast, non-cancerous per patient.   . Colonoscopy  10/29/2008    [id number] examination with some left-sided diverticula/Diminutive cecal polyp, status post cold biopsy removal.  Right colonic mucosa appeared normal. adenomatous  . Colonoscopy N/A 01/17/2014    Procedure: COLONOSCOPY;  Surgeon: Daneil Dolin, MD;  [facility];  Service: Endoscopy;  Laterality: N/A;  1115  . Mastectomy      There were no vitals filed for this visit.  Visit Diagnosis:  Unsteady gait  Unsteadiness on feet  Weakness of both lower extremities  Vestibular hypofunction of right ear  Poor posture      Subjective Assessment - 06/17/15 0846    Subjective Pt with no dizzines this morning. She notes it's not too bad but she notices it occasionally when up and doing things,  but goes away in less than a minute. She has been performing her HEP 1-2x/ day.    Pertinent History hx of neck pain and Bell's Palsy    Currently in Pain? No/denies                         [facility] Adult PT Treatment/Exercise - 06/17/15 0001    Knee/Hip Exercises: Aerobic   Nustep 4 minutes on L1, seat at 6   Knee/Hip Exercises: Standing   Heel Raises 20 reps;1 set;Both  BUE support   Functional Squat 2 sets;10 reps  No UE         Vestibular Treatment/Exercise - 06/17/15 0001    Vestibular Treatment/Exercise   Gaze Exercises Reviewed seated VORx1 and VORx2 (x1 min trial ea.)            Balance Exercises - 06/17/15 0859    Balance Exercises: Standing   SLS Solid surface;30 secs;Eyes open;2 reps  L: 10sec and 20 sec LOB x1; R: 20 sec and 5 sec    Gait with Head Turns Forward;Other (comment)  with quick stops/turns/head turns CGA    Tandem Gait Foam/compliant surface;Forward  2 RT, CGA   Turning Other (comment)  ambulating around clinic with dual tasking, quick turns   Overall Comments Other (comment)  supervision to CGA for safety  PT Education - 06/17/15 0850    Education provided Yes   Education Details Reviewed HEP and discussed compliance.    Person(s) Educated Patient   Methods Explanation   Comprehension Verbalized understanding          PT Short Term Goals - 05/27/15 0918    PT SHORT TERM GOAL #1   Title Patient will report less frequent dizziness from 1x/week to 1x/every other week in order to return to her PLOF.    Time 3   Period Weeks   Status New   PT SHORT TERM GOAL #2   Title Patient and caregiver will independently verbalize 4/4 fall precautions in order to reduce the risk for falls.    Time 3   Period Weeks   Status New   PT SHORT TERM GOAL #3   Title Patient and spouse will independently demo full understanding with initial VOR/ habituation HEP with use of handouts.    Time 2   Period Weeks   Status New            PT Long Term Goals - 05/27/15 0921    PT LONG TERM GOAL #1   Title Patient will improve DGI score to 24/24 in order to reduce the risk for falls.    Time 6   Period Weeks   Status New   PT LONG TERM GOAL #2   Title Patient's TUG time will improve to <13 sec without the use of an AD in order to reduce the risk for falls.    Time 5   Period Weeks   Status New   PT LONG TERM GOAL #3   Title Patient will report decreased dizziness intensity to <3/10 on a VAS in order to improve tolerance with ADLs and leisure activities.    Time 6   Period Weeks   Status New   PT LONG TERM GOAL #4   Title Patient will improve B hip strength to 5/5 MMT in order to improve pelvic stabilization with community ambulation.    Time 6   Period Weeks   Status New   PT LONG TERM GOAL #5   Title Patient and spouse will independently demo full understanding with habituation/VOR HEP in order to ensure proper completion prior to DC from PT.    Time 6   Period Weeks   Status New               Plan - 06/17/15 0932    Clinical Impression Statement Pt tx focused on LE strengthening and dynamic balance activities. Pt with increased difficulty repeating her HEP to therapist, so therapist reviewed VOR x1 and VOR x2 activity with reported understanding and returned demonstration with pt. Pt demonstrates improved ability to maneuver the hallways and perform dynamic balance training without LOB. She demonstrates increased difficulty with tandem and SLS, especially with EC and on non compliant surfaces. Pt with no complaints of dizziness by the end of today's session. Will continue to progress LE strengthening, balance and gaze   Pt will benefit from skilled therapeutic intervention in order to improve on the following deficits Abnormal gait;Decreased safety awareness;Dizziness;Decreased balance;Improper body mechanics;Postural dysfunction;Decreased strength   Rehab Potential Good   Clinical Impairments  Affecting Rehab Potential pre-existing HA and vertigo; also early stages of dementia per husband    PT Frequency 2x / week   PT Duration 6 weeks   PT Treatment/Interventions ADLs/Self Care Home Management;Balance training;Therapeutic exercise;Therapeutic activities;Functional mobility training;Stair training;Gait training;DME Instruction;Neuromuscular re-education;Patient/family education;Manual  techniques;Passive range of motion;Vestibular   PT Next Visit Plan Next visit to focus on dynamic standing/gait balance training and  VOR x1 /VOR x2 ther ex. Review and complete B LE functional strengthening   PT Home Exercise Plan Continue to reinforce HEP including VOR x 1; Review VOR x 1 ther ex with pt and spouse at next PT visit   Consulted and Agree with Plan of Care Patient        Problem List Patient Active Problem List   Diagnosis Date Noted  . Adenomatous polyp 01/02/2014    9:45 AM,06/17/2015 Elly Modena PT, DPT [facility] [address] [place], [place], [address] Phone: [phone]   Fax:  [phone]  Name: Tonya Cortez MRN: [id number] Date of Birth: [birth year]

## 2015-06-18 DIAGNOSIS — M546 Pain in thoracic spine: Secondary | ICD-10-CM | POA: Diagnosis not present

## 2015-06-18 DIAGNOSIS — G43001 Migraine without aura, not intractable, with status migrainosus: Secondary | ICD-10-CM | POA: Diagnosis not present

## 2015-06-18 DIAGNOSIS — M47812 Spondylosis without myelopathy or radiculopathy, cervical region: Secondary | ICD-10-CM | POA: Diagnosis not present

## 2015-06-18 DIAGNOSIS — M47816 Spondylosis without myelopathy or radiculopathy, lumbar region: Secondary | ICD-10-CM | POA: Diagnosis not present

## 2015-06-18 DIAGNOSIS — M9901 Segmental and somatic dysfunction of cervical region: Secondary | ICD-10-CM | POA: Diagnosis not present

## 2015-06-18 DIAGNOSIS — M9902 Segmental and somatic dysfunction of thoracic region: Secondary | ICD-10-CM | POA: Diagnosis not present

## 2015-06-18 DIAGNOSIS — M9903 Segmental and somatic dysfunction of lumbar region: Secondary | ICD-10-CM | POA: Diagnosis not present

## 2015-06-19 ENCOUNTER — Ambulatory Visit (HOSPITAL_COMMUNITY): Payer: Medicare Other | Admitting: Physical Therapy

## 2015-06-19 DIAGNOSIS — R2681 Unsteadiness on feet: Secondary | ICD-10-CM

## 2015-06-19 DIAGNOSIS — R293 Abnormal posture: Secondary | ICD-10-CM | POA: Diagnosis not present

## 2015-06-19 DIAGNOSIS — R29898 Other symptoms and signs involving the musculoskeletal system: Secondary | ICD-10-CM | POA: Diagnosis not present

## 2015-06-19 DIAGNOSIS — H832X1 Labyrinthine dysfunction, right ear: Secondary | ICD-10-CM

## 2015-06-19 NOTE — Therapy (Signed)
[place], [place], [address] Phone: [phone]   Fax:  [phone]  Physical Therapy Treatment  Patient Details  Name: Tonya Cortez MRN: [id number] Date of Birth: 1940/05/22 Referring Provider: Dr. Lucianne Lei  Encounter Date: 06/19/2015      PT End of Session - 06/19/15 0925    Visit Number 8   Number of Visits 12   Date for PT Re-Evaluation 06/26/15   Authorization Type Medicare/[employer]    Authorization Time Period 05/26/2012 to 07/08/2015   Authorization - Visit Number 8   Authorization - Number of Visits 10   PT Start Time 0845   PT Stop Time 0935   PT Time Calculation (min) 50 min   Equipment Utilized During Treatment Gait belt   Activity Tolerance Patient tolerated treatment well   Behavior During Therapy [facility] for tasks assessed/performed      Past Medical History  Diagnosis Date  . Bell's palsy   . Arthritis   . Hypercholesterolemia   . Borderline diabetes   . Cancer [facility])     Past Surgical History  Procedure Laterality Date  . Abdominal hysterectomy    . Breast surgery    . Mastectomy Left     abnormal mass in breast, non-cancerous per patient.   . Colonoscopy  10/29/2008    [id number] examination with some left-sided diverticula/Diminutive cecal polyp, status post cold biopsy removal.  Right colonic mucosa appeared normal. adenomatous  . Colonoscopy N/A 01/17/2014    Procedure: COLONOSCOPY;  Surgeon: Daneil Dolin, MD;  [facility];  Service: Endoscopy;  Laterality: N/A;  1115  . Mastectomy      There were no vitals filed for this visit.  Visit Diagnosis:  Unsteady gait  Unsteadiness on feet  Weakness of both lower extremities  Vestibular hypofunction of right ear  Poor posture                       [facility] Adult PT Treatment/Exercise - 06/19/15 0901    Knee/Hip Exercises: Standing   Heel Raises 20 reps;1 set;Both   Forward Lunges  Both;10 reps   Forward Lunges Limitations onto 4" no UE's   Functional Squat 2 sets;10 reps   Other Standing Knee Exercises postural 3 exercises GTB 10 reps each             Balance Exercises - 06/19/15 0903    Balance Exercises: Standing   Tandem Stance Eyes open;Eyes closed;30 secs;2 reps  eyes opened with head turns, eyes closed static   SLS Solid surface;Eyes open;5 reps  max Lt: 12", Rt: 25"   Gait with Head Turns Forward;Other (comment)   Tandem Gait Foam/compliant surface;Forward;Retro   Sidestepping Foam/compliant support;2 reps   Turning Other (comment)   Step Over Hurdles / Cones 6"/12" on beam 4RT             PT Short Term Goals - 05/27/15 0918    PT SHORT TERM GOAL #1   Title Patient will report less frequent dizziness from 1x/week to 1x/every other week in order to return to her PLOF.    Time 3   Period Weeks   Status New   PT SHORT TERM GOAL #2   Title Patient and caregiver will independently verbalize 4/4 fall precautions in order to reduce the risk for falls.    Time 3   Period Weeks   Status New   PT SHORT TERM GOAL #  3   Title Patient and spouse will independently demo full understanding with initial VOR/ habituation HEP with use of handouts.    Time 2   Period Weeks   Status New           PT Long Term Goals - 05/27/15 0921    PT LONG TERM GOAL #1   Title Patient will improve DGI score to 24/24 in order to reduce the risk for falls.    Time 6   Period Weeks   Status New   PT LONG TERM GOAL #2   Title Patient's TUG time will improve to <13 sec without the use of an AD in order to reduce the risk for falls.    Time 5   Period Weeks   Status New   PT LONG TERM GOAL #3   Title Patient will report decreased dizziness intensity to <3/10 on a VAS in order to improve tolerance with ADLs and leisure activities.    Time 6   Period Weeks   Status New   PT LONG TERM GOAL #4   Title Patient will improve B hip strength to 5/5 MMT in order to  improve pelvic stabilization with community ambulation.    Time 6   Period Weeks   Status New   PT LONG TERM GOAL #5   Title Patient and spouse will independently demo full understanding with habituation/VOR HEP in order to ensure proper completion prior to DC from PT.    Time 6   Period Weeks   Status New               Plan - 06/19/15 [id number]    Clinical Impression Statement Focus today on dynamic balance actvities.  Pt with little diffiuculty completing tasks including stop/go/head turns with gait.  Pt able to ambulate at fast pase and stop suddenly without LOB.  Increased challenge on balance beam with hurdle negotiation.  Pt able to complete with CGA from therapist and able to self correct independently.  began static tandem gait with eyes closed to increase diffiuculty without visual stimulus.  Lateral sway noted with this actvity but again able to self correct.  No dizziness or gross LOB noted throughout session.  Session completed with nustep but not included in billing.    Pt will benefit from skilled therapeutic intervention in order to improve on the following deficits Abnormal gait;Decreased safety awareness;Dizziness;Decreased balance;Improper body mechanics;Postural dysfunction;Decreased strength   Rehab Potential Good   Clinical Impairments Affecting Rehab Potential pre-existing HA and vertigo; also early stages of dementia per husband    PT Frequency 2x / week   PT Duration 6 weeks   PT Treatment/Interventions ADLs/Self Care Home Management;Balance training;Therapeutic exercise;Therapeutic activities;Functional mobility training;Stair training;Gait training;DME Instruction;Neuromuscular re-education;Patient/family education;Manual techniques;Passive range of motion;Vestibular   PT Next Visit Plan Next visit to focus on dynamic standing/gait balance training and  VOR x1 /VOR x2 ther ex. Review and complete B LE functional strengthening   PT Home Exercise Plan Continue to  reinforce HEP including VOR x 1; Review VOR x 1 ther ex with pt and spouse at next PT visit   Consulted and Agree with Plan of Care Patient        Problem List Patient Active Problem List   Diagnosis Date Noted  . Adenomatous polyp 01/02/2014    Teena Irani, PTA/CLT [phone]  06/19/2015, 9:33 AM  [place] [address] [place], [place], [address] Phone: [phone]  Fax:  [phone]  Name: Tonya Cortez MRN: [id number] Date of Birth: [birth year]

## 2015-06-20 DIAGNOSIS — M791 Myalgia: Secondary | ICD-10-CM | POA: Diagnosis not present

## 2015-06-20 DIAGNOSIS — M542 Cervicalgia: Secondary | ICD-10-CM | POA: Diagnosis not present

## 2015-06-20 DIAGNOSIS — G518 Other disorders of facial nerve: Secondary | ICD-10-CM | POA: Diagnosis not present

## 2015-06-20 DIAGNOSIS — R51 Headache: Secondary | ICD-10-CM | POA: Diagnosis not present

## 2015-06-21 DIAGNOSIS — G441 Vascular headache, not elsewhere classified: Secondary | ICD-10-CM | POA: Diagnosis not present

## 2015-06-21 DIAGNOSIS — G309 Alzheimer's disease, unspecified: Secondary | ICD-10-CM | POA: Diagnosis not present

## 2015-06-21 DIAGNOSIS — E782 Mixed hyperlipidemia: Secondary | ICD-10-CM | POA: Diagnosis not present

## 2015-06-21 DIAGNOSIS — G40209 Localization-related (focal) (partial) symptomatic epilepsy and epileptic syndromes with complex partial seizures, not intractable, without status epilepticus: Secondary | ICD-10-CM | POA: Diagnosis not present

## 2015-06-24 ENCOUNTER — Encounter (HOSPITAL_COMMUNITY): Payer: Self-pay

## 2015-06-24 ENCOUNTER — Ambulatory Visit (HOSPITAL_COMMUNITY): Payer: Medicare Other

## 2015-06-24 DIAGNOSIS — R29898 Other symptoms and signs involving the musculoskeletal system: Secondary | ICD-10-CM | POA: Diagnosis not present

## 2015-06-24 DIAGNOSIS — H832X1 Labyrinthine dysfunction, right ear: Secondary | ICD-10-CM

## 2015-06-24 DIAGNOSIS — R2681 Unsteadiness on feet: Secondary | ICD-10-CM

## 2015-06-24 DIAGNOSIS — R293 Abnormal posture: Secondary | ICD-10-CM | POA: Diagnosis not present

## 2015-06-24 NOTE — Therapy (Signed)
[place], [place], [address] Phone: [phone]   Fax:  [phone]  Physical Therapy Treatment/ 30-day/10th visit Progress Note  Patient Details  Name: Tonya Cortez MRN: [id number] Date of Birth: 1941-01-17 Referring Provider: Dr. Lucianne Lei   Encounter Date: 06/24/2015      PT End of Session - 06/24/15 0844    Visit Number 9   Number of Visits 12   Date for PT Re-Evaluation 06/26/15   Authorization Type Medicare/[employer]    Authorization Time Period 06-04-12 to 07/17/2015 ( G-codes and progress note completed on 06/24/15)    Authorization - Visit Number 9   Authorization - Number of Visits 10   PT Start Time 0843   PT Stop Time 0934   PT Time Calculation (min) 51 min   Equipment Utilized During Treatment Gait belt   Activity Tolerance Patient tolerated treatment well   Behavior During Therapy [facility] for tasks assessed/performed      Past Medical History  Diagnosis Date  . Bell's palsy   . Arthritis   . Hypercholesterolemia   . Borderline diabetes   . Cancer [facility])     Past Surgical History  Procedure Laterality Date  . Abdominal hysterectomy    . Breast surgery    . Mastectomy Left     abnormal mass in breast, non-cancerous per patient.   . Colonoscopy  10/29/2008    [id number] examination with some left-sided diverticula/Diminutive cecal polyp, status post cold biopsy removal.  Right colonic mucosa appeared normal. adenomatous  . Colonoscopy N/A 01/17/2014    Procedure: COLONOSCOPY;  Surgeon: Daneil Dolin, MD;  [facility];  Service: Endoscopy;  Laterality: N/A;  1115  . Mastectomy      There were no vitals filed for this visit.  Visit Diagnosis:  Unsteady gait  Unsteadiness on feet  Vestibular hypofunction of right ear  Weakness of both lower extremities      Subjective Assessment - 06/24/15 0846    Subjective Pt reports that her dizziness has "really  improved" and that she is steadier while walking since beginning with PT. Pt reports fair to good compliance with her HEP. Pt denied falls since her last PT visit. Pt denied c/o dizziness or vertigo upon arrival.  Furthermore, pt denied dizziness since her last PT visit.    Patient is accompained by: Family member   Pertinent History hx of neck pain and Bell's Palsy    Limitations Walking   How long can you sit comfortably? No difficulty    How long can you stand comfortably? No difficulty    How long can you walk comfortably? No difficulty    Patient Stated Goals Pt's goal is to reduce her dizziness and improve her balance while ambulating    Currently in Pain? No/denies            [facility] PT Assessment - 06/24/15 0001    Assessment   Medical Diagnosis Vertigo/ unsteady balance    Referring Provider Dr. Lucianne Lei    Onset Date/Surgical Date 05/03/15   Hand Dominance Right   Next MD Visit Unknown per pt   Prior Therapy --   Precautions   Precautions Fall   Restrictions   Weight Bearing Restrictions No   [place] residence   Living Arrangements Spouse/significant other   Available Help at Discharge Family   Type of [place] to enter  Home Layout Two level   Alternate Level Stairs-Number of Steps 13   Prior Function   Level of Independence Independent with basic ADLs;Independent with household mobility without device   Vocation Retired   Leisure sewing (makes caps)   Observation/Other Assessments   Focus on Therapeutic Outcomes (FOTO)  24% limited    Sensation   Light Touch Appears Intact   Single Leg Stance   Comments L LE= 30 seconds; R LE= 30 seconds   Posture/Postural Control   Posture Comments forward head, kyphotic posture in general    Strength   Right Hip Flexion 4/5   Right Hip Extension 4-/5   Right Hip ABduction 4+/5   Right Hip ADduction 4+/5   Left Hip Flexion 4/5   Left Hip Extension 4-/5    Left Hip ABduction 4+/5   Left Hip ADduction 4+/5   Right Knee Flexion 4+/5   Right Knee Extension 5/5   Left Knee Flexion 4+/5   Left Knee Extension 5/5   Right Ankle Dorsiflexion 5/5   Right Ankle Plantar Flexion 4/5   Right Ankle Inversion 5/5   Right Ankle Eversion 5/5   Left Ankle Dorsiflexion 5/5   Left Ankle Plantar Flexion 4/5   Left Ankle Inversion 5/5   Left Ankle Eversion 5/5   Transfers   Transfers Sit to Stand;Stand to Sit   Sit to Stand 7: Independent   Stand to Sit 7: Independent   Ambulation/Gait   Ambulation/Gait Yes   Ambulation/Gait Assistance 7: Independent   Ambulation Distance (Feet) 600 Feet   Assistive device None   Gait Pattern --  Improved heel to toe gait pattern with good rec arm swing   Ambulation Surface Level   Dynamic Gait Index   Level Surface Normal   Change in Gait Speed Normal   Gait with Horizontal Head Turns Normal   Gait with Vertical Head Turns Normal   Gait and Pivot Turn Normal   Step Over Obstacle Normal   Step Around Obstacles Normal   Steps Normal   Total Score 24   DGI comment: completed without an AD   Timed Up and Go Test   Normal TUG (seconds) 11   TUG Comments without an AD                     [facility] Adult PT Treatment/Exercise - 06/24/15 0001    Knee/Hip Exercises: Standing   Heel Raises 20 reps;1 set;Both   Forward Lunges Both;2 sets;15 reps   Forward Lunges Limitations onto 4" no UE's   Functional Squat 2 sets;10 reps   Functional Squat Limitations with B UE support          Vestibular Treatment/Exercise - 06/24/15 0001    Vestibular Treatment/Exercise   Gaze Exercises X1 Viewing Horizontal;X2 Viewing Horizontal   X1 Viewing Horizontal   Foot Position shoulder width apart    Time 0100  minute    Reps 4   Comments standing   X2 Viewing Horizontal   Foot Position N/A= seated    Time 0100  minute    Reps 3    Comments seated            Balance Exercises - 06/24/15 0902    Balance  Exercises: Standing   SLS Eyes open;3 reps;Foam/compliant surface   Rockerboard EO;Anterior/posterior;Lateral;Other (comment)  x  2 minutes each direction with intermittent UE assist    Gait with Head Turns Forward;Other (comment)  with quick stops/turns/head turns CGA x  3 sets x 1 minut   Tandem Gait Foam/compliant surface;Forward;Retro;5 reps;Intermittent upper extremity support   Sidestepping Foam/compliant support;Other (comment)  3 sets of 8 steps   Step Over Hurdles / Cones 6"/12" hurdles ( 2 of each) on airrex beam 4RT   Overall Comments Other (comment)  close supervision to [place] x 1           PT Education - 06/24/15 0849    Education provided Yes   Education Details Reviewed current HEP and findings with PT reassessment    Person(s) Educated Patient;Spouse   Methods Explanation;Demonstration   Comprehension Verbalized understanding;Returned demonstration;Verbal cues required          PT Short Term Goals - 06/24/15 0911    PT SHORT TERM GOAL #1   Title Patient will report less frequent dizziness from 1x/week to 1x/every other week in order to return to her PLOF.    Time 3   Period Weeks   Status Achieved   PT SHORT TERM GOAL #2   Title Patient and caregiver will independently verbalize 4/4 fall precautions in order to reduce the risk for falls.    Baseline Pt was able to recall 2/4 fall precautions    Time 3   Period Weeks   Status Partially Met   PT SHORT TERM GOAL #3   Title Patient and spouse will independently demo full understanding with initial VOR/ habituation HEP with use of handouts.    Time 2   Period Weeks   Status On-going           PT Long Term Goals - 06/24/15 0912    PT LONG TERM GOAL #1   Title Patient will improve DGI score to 24/24 in order to reduce the risk for falls.    Baseline DGI as of 06/24/2015= 24/24    Time 6   Period Weeks   Status Achieved   PT LONG TERM GOAL #2   Title Patient's TUG time will improve to <13 sec without  the use of an AD in order to reduce the risk for falls.    Baseline 11 seconds without an AD   Time 5   Period Weeks   Status Achieved   PT LONG TERM GOAL #3   Title Patient will report decreased dizziness intensity to <3/10 on a VAS in order to improve tolerance with ADLs and leisure activities.    Baseline dizziness ranges between a 0-4/10    Time 6   Period Weeks   Status On-going   PT LONG TERM GOAL #4   Title Patient will improve B hip strength to 5/5 MMT in order to improve pelvic stabilization with community ambulation.    Baseline B quad strength improved to 5/5 MMT; B hip abduction improved to 4+/5 MMT    Time 6   Period Weeks   Status On-going               Plan - 06/24/15 0844    Clinical Impression Statement Tonya Cortez is a 75 yo female who has been seen for a total of 9 PT visits thus far with initial complaints of dizziness and unsteady gait associated with a unilateral vestibular hypofunction. The pt is making excellent progress towards stated goals with less severe and less frequent dizziness, improved standing/walking balance, and improved B LE strength. DGI improved to 24/24 indicating improved walking balance with less risk for falls. PT tx was focused on VOR ther ex, dynamic standing/walking balance, and B LE  strengthening ther ex with added set with forward lunges. Good tolerance reported and assessed with today's PT tx. No adverse effects reported. Minor dizziness was reported with first set of VOR x 1 with symptoms subsiding after 14 seconds. Dizziness was rated a 0/10 at the completion of PT tx. The pt is forgetful and is inconsistent with understanding of the current HEP and fall precautions. However, the pt's spouse was able to correctly verbalize proper HEP technique and parameters. The pt continues to present with minor LE strength deficits and minor dizziness. Plan to continue with current POC with focus on VOR x1/VOR x2 ther ex and functional B LE  strengthening. Plan to DC pt after remaining 3 PT visits. Pt and spouse are in agreement with upcoming DC and are both happy with the progress made with PT services.    Pt will benefit from skilled therapeutic intervention in order to improve on the following deficits Abnormal gait;Decreased safety awareness;Dizziness;Decreased balance;Improper body mechanics;Postural dysfunction;Decreased strength   Rehab Potential Good   Clinical Impairments Affecting Rehab Potential pre-existing HA and vertigo; also early stages of dementia per husband    PT Frequency 2x / week   PT Duration 6 weeks   PT Treatment/Interventions ADLs/Self Care Home Management;Balance training;Therapeutic exercise;Therapeutic activities;Functional mobility training;Stair training;Gait training;DME Instruction;Neuromuscular re-education;Patient/family education;Manual techniques;Passive range of motion;Vestibular   PT Next Visit Plan Next visit to focus on dynamic standing/gait balance training and  VOR x1 /VOR x2 ther ex. Review and complete B LE functional strengthening   PT Home Exercise Plan Continue to reinforce HEP including VOR x 1; Review VOR x 1 ther ex with pt and spouse at next PT visit   Recommended Other Services None at this time    Consulted and Agree with Plan of Care Patient   Family Member Consulted Spouse          G-Codes - Jun 26, 2015 0944    Functional Assessment Tool Used Per FOTO    Functional Limitation Mobility: Walking and moving around   Mobility: Walking and Moving Around Current Status [phone]) At least 20 percent but less than 40 percent impaired, limited or restricted   Mobility: Walking and Moving Around Goal Status [phone]) At least 1 percent but less than 20 percent impaired, limited or restricted      Problem List Patient Active Problem List   Diagnosis Date Noted  . Adenomatous polyp 01/02/2014    Tonya Cortez, PT, DPT  06/26/2015, 9:54 AM  [place], [place], [address] Phone: [phone]   Fax:  [phone]  Name: DONNIE GEDEON MRN: [id number] Date of Birth: November 05, 1940

## 2015-06-26 ENCOUNTER — Ambulatory Visit (HOSPITAL_COMMUNITY): Payer: Medicare Other

## 2015-06-26 ENCOUNTER — Encounter (HOSPITAL_COMMUNITY): Payer: Self-pay

## 2015-06-26 DIAGNOSIS — H832X1 Labyrinthine dysfunction, right ear: Secondary | ICD-10-CM | POA: Diagnosis not present

## 2015-06-26 DIAGNOSIS — R293 Abnormal posture: Secondary | ICD-10-CM | POA: Diagnosis not present

## 2015-06-26 DIAGNOSIS — R2681 Unsteadiness on feet: Secondary | ICD-10-CM | POA: Diagnosis not present

## 2015-06-26 DIAGNOSIS — R29898 Other symptoms and signs involving the musculoskeletal system: Secondary | ICD-10-CM

## 2015-06-26 NOTE — Therapy (Signed)
[place], [place], [address] Phone: [phone]   Fax:  [phone]  Physical Therapy Treatment  Patient Details  Name: Tonya Cortez MRN: [id number] Date of Birth: [birth year] Referring Provider: Dr. Lucianne Lei   Encounter Date: 06/26/2015      PT End of Session - 06/26/15 0843    Visit Number 10   Number of Visits 12   Date for PT Re-Evaluation 12-Jul-2015   Authorization Type Medicare/[employer]    Authorization Time Period 05/30/2012 to 07-12-2015 ( G-codes and progress note completed on 06/24/15)    Authorization - Visit Number 10   Authorization - Number of Visits 20   PT Start Time [phone]   PT Stop Time 0928   PT Time Calculation (min) 50 min   Equipment Utilized During Treatment Gait belt   Activity Tolerance Patient tolerated treatment well   Behavior During Therapy [facility] for tasks assessed/performed      Past Medical History  Diagnosis Date  . Bell's palsy   . Arthritis   . Hypercholesterolemia   . Borderline diabetes   . Cancer [facility])     Past Surgical History  Procedure Laterality Date  . Abdominal hysterectomy    . Breast surgery    . Mastectomy Left     abnormal mass in breast, non-cancerous per patient.   . Colonoscopy  10/29/2008    KGU:[id number] examination with some left-sided diverticula/Diminutive cecal polyp, status post cold biopsy removal.  Right colonic mucosa appeared normal. adenomatous  . Colonoscopy N/A 01/17/2014    Procedure: COLONOSCOPY;  Surgeon: Daneil Dolin, MD;  [facility];  Service: Endoscopy;  Laterality: N/A;  1115  . Mastectomy      There were no vitals filed for this visit.  Visit Diagnosis:  Unsteady gait  Unsteadiness on feet  Vestibular hypofunction of right ear  Weakness of both lower extremities      Subjective Assessment - 06/26/15 0840    Subjective Pt denied dizziness upon arrival and further noted that she hasn't had any  dizziness since her last PT visit. No new complaints reported upon arrival. Pt reported good compliance with her HEP, but she was unable to describe her HEP without cues.    Patient is accompained by: Family member   Pertinent History hx of neck pain and Bell's Palsy    How long can you sit comfortably? No difficulty    How long can you stand comfortably? No difficulty    How long can you walk comfortably? No difficulty    Patient Stated Goals Pt's goal is to reduce her dizziness and improve her balance while ambulating    Currently in Pain? No/denies                         [facility] Adult PT Treatment/Exercise - 06/26/15 0001    Knee/Hip Exercises: Standing   Heel Raises 20 reps;1 set;Both   Forward Lunges Both;2 sets;15 reps   Forward Lunges Limitations onto 4" no UE's   Functional Squat 2 sets;10 reps   Functional Squat Limitations with B UE support          Vestibular Treatment/Exercise - 06/26/15 0001    Vestibular Treatment/Exercise   Gaze Exercises X1 Viewing Horizontal;X2 Viewing Horizontal   X1 Viewing Horizontal   Foot Position shoulder width apart    Time 0100  minute    Reps 4   Comments standing on  airex pad; shoulder width apart    X2 Viewing Horizontal   Foot Position standing on airex pad; shoulder width apart    Time 0100  minute    Reps 3            Balance Exercises - 06/26/15 0851    Balance Exercises: Standing   SLS Eyes open; 5 reps;Foam/compliant surface; with intermittent UE support, Completed bilaterally   Rockerboard EO;Anterior/posterior;Lateral;Other (comment)  x  2 minutes each direction with intermittent UE assist    Gait with Head Turns Forward;Other (comment)  with quick stops/turns/head turns CGA x 3 sets x 1 minute   Tandem Gait Foam/compliant surface;Forward;Retro;5 reps;Intermittent upper extremity support   Sidestepping Foam/compliant support;Other (comment)  3 sets of 8 steps   Step Over Hurdles / Cones SLS cone  taps x 15 reps, bilaterally;    Overall Comments Other (comment)  close supervision to [place] x 1           PT Education - 06/26/15 0843    Education provided Yes   Education Details HEP and 4/4 precautions    Person(s) Educated Patient;Spouse   Methods Explanation   Comprehension Verbalized understanding          PT Short Term Goals - 06/24/15 0911    PT SHORT TERM GOAL #1   Title Patient will report less frequent dizziness from 1x/week to 1x/every other week in order to return to her PLOF.    Time 3   Period Weeks   Status Achieved   PT SHORT TERM GOAL #2   Title Patient and caregiver will independently verbalize 4/4 fall precautions in order to reduce the risk for falls.    Baseline Pt was able to recall 2/4 fall precautions    Time 3   Period Weeks   Status Partially Met   PT SHORT TERM GOAL #3   Title Patient and spouse will independently demo full understanding with initial VOR/ habituation HEP with use of handouts.    Time 2   Period Weeks   Status On-going           PT Long Term Goals - 06/24/15 0912    PT LONG TERM GOAL #1   Title Patient will improve DGI score to 24/24 in order to reduce the risk for falls.    Baseline DGI as of 06/24/2015= 24/24    Time 6   Period Weeks   Status Achieved   PT LONG TERM GOAL #2   Title Patient's TUG time will improve to <13 sec without the use of an AD in order to reduce the risk for falls.    Baseline 11 seconds without an AD   Time 5   Period Weeks   Status Achieved   PT LONG TERM GOAL #3   Title Patient will report decreased dizziness intensity to <3/10 on a VAS in order to improve tolerance with ADLs and leisure activities.    Baseline dizziness ranges between a 0-4/10    Time 6   Period Weeks   Status On-going   PT LONG TERM GOAL #4   Title Patient will improve B hip strength to 5/5 MMT in order to improve pelvic stabilization with community ambulation.    Baseline B quad strength improved to 5/5 MMT; B  hip abduction improved to 4+/5 MMT    Time 6   Period Weeks   Status On-going               Plan -  06/26/15 0834    Clinical Impression Statement PT tx was focused on VORx1/VOR x 2 ther ex, standing dynamic balance training, and B LE strengthening ther ex. Progressed VOR x 1 ther ex with addition of standing on airex pad and also progressed VOR x 2 ther ex from seated to standing position. Pt required tactile and verbal cues for proper technique and coordination with VOR x 2 ther ex. Increased sway assessed with VOR x 1 while standing on airex pad, but pt was able to maintain her balance. Improved balance and minimal sway assessed with subsequent reps. Pt demo minimal recall with HEP and completed VOR x 1 ther ex. Instructed spouse to assist pt with HEP and to read the HEP handout in order to ensure proper completion. Minor dizziness reported with VOR ther ex which increased from 0/10 to 1-2/10, which subsided quickly after ~15 seconds. Reviewed and completed standing functional strengthening ther ex with good tolerance reported and assessed. Occasional verbal cues required for proper squatting technique. Completed dynamic standing balance activities with minor LOB assessed with tandem walking on airex beam, but pt was able to regain her balance independently. Dizziness was rated a 0/10 at the completion of PT visit. Continue with current POC.    Pt will benefit from skilled therapeutic intervention in order to improve on the following deficits Abnormal gait;Decreased safety awareness;Dizziness;Decreased balance;Improper body mechanics;Postural dysfunction;Decreased strength   Rehab Potential Good   Clinical Impairments Affecting Rehab Potential pre-existing HA and vertigo; also early stages of dementia per husband    PT Treatment/Interventions ADLs/Self Care Home Management;Balance training;Therapeutic exercise;Therapeutic activities;Functional mobility training;Stair training;Gait training;DME  Instruction;Neuromuscular re-education;Patient/family education;Manual techniques;Passive range of motion;Vestibular   PT Next Visit Plan Next visit to focus on dynamic standing/gait balance training and  VOR x1 /VOR x2 ther ex. Review and complete B LE functional strengthening   PT Home Exercise Plan Continue to reinforce HEP including VOR x 1; Review VOR x 1 ther ex with pt and spouse at next PT visit   Recommended Other Services None at this time    Consulted and Agree with Plan of Care Patient   Family Member Consulted Spouse        Problem List Patient Active Problem List   Diagnosis Date Noted  . Adenomatous polyp 01/02/2014    Garen Lah, PT, DPT   06/26/2015, 10:23 AM  [place] [address] [place], [place], [address] Phone: [phone]   Fax:  [phone]  Name: Tonya Cortez MRN: [id number] Date of Birth: [birth year]

## 2015-07-03 ENCOUNTER — Ambulatory Visit (HOSPITAL_COMMUNITY): Payer: Medicare Other | Admitting: Physical Therapy

## 2015-07-03 DIAGNOSIS — R29898 Other symptoms and signs involving the musculoskeletal system: Secondary | ICD-10-CM | POA: Diagnosis not present

## 2015-07-03 DIAGNOSIS — R2681 Unsteadiness on feet: Secondary | ICD-10-CM

## 2015-07-03 DIAGNOSIS — R293 Abnormal posture: Secondary | ICD-10-CM

## 2015-07-03 DIAGNOSIS — H832X1 Labyrinthine dysfunction, right ear: Secondary | ICD-10-CM

## 2015-07-03 NOTE — Therapy (Signed)
[place], [place], [address] Phone: [phone]   Fax:  [phone]  Physical Therapy Treatment  Patient Details  Name: Tonya Cortez MRN: [id number] Date of Birth: [birth year] Referring Provider: Dr. Lucianne Lei   Encounter Date: 07/03/2015      PT End of Session - 07/03/15 0957    Visit Number 11   Number of Visits 12   Date for PT Re-Evaluation 2015-08-01   Authorization Type Medicare/[employer]    Authorization Time Period Jun 19, 2012 to 01-Aug-2015 ( G-codes and progress note completed on 06/24/15)    Authorization - Visit Number 11   Authorization - Number of Visits 20   PT Start Time 0848   PT Stop Time 0933   PT Time Calculation (min) 45 min   Equipment Utilized During Treatment Gait belt   Activity Tolerance Patient tolerated treatment well   Behavior During Therapy [facility] for tasks assessed/performed      Past Medical History  Diagnosis Date  . Bell's palsy   . Arthritis   . Hypercholesterolemia   . Borderline diabetes   . Cancer [facility])     Past Surgical History  Procedure Laterality Date  . Abdominal hysterectomy    . Breast surgery    . Mastectomy Left     abnormal mass in breast, non-cancerous per patient.   . Colonoscopy  10/29/2008    [id number] examination with some left-sided diverticula/Diminutive cecal polyp, status post cold biopsy removal.  Right colonic mucosa appeared normal. adenomatous  . Colonoscopy N/A 01/17/2014    Procedure: COLONOSCOPY;  Surgeon: Daneil Dolin, MD;  [facility];  Service: Endoscopy;  Laterality: N/A;  1115  . Mastectomy      There were no vitals filed for this visit.  Visit Diagnosis:  Unsteady gait  Unsteadiness on feet  Vestibular hypofunction of right ear  Weakness of both lower extremities  Poor posture      Subjective Assessment - 07/03/15 0852    Subjective Pt states her dizziness is going away.  No complaints of  dizziness for over 3 weeks. Reports compliance with HEP.    Currently in Pain? No/denies                         [facility] Adult PT Treatment/Exercise - 07/03/15 0856    Knee/Hip Exercises: Standing   Heel Raises 20 reps;1 set;Both   Forward Lunges Both;2 sets;15 reps   Forward Lunges Limitations onto 4" no UE's   Functional Squat 2 sets;10 reps   Functional Squat Limitations no UE support   Other Standing Knee Exercises postural 3 exercises GTB 10 reps each  reviewed for HEP per request             Balance Exercises - 07/03/15 0956    Balance Exercises: Standing   SLS Eyes open  35" Lt, 42" Rt   Rockerboard EO;Anterior/posterior;Lateral;Other (comment)   Gait with Head Turns Forward;Other (comment)   Tandem Gait Foam/compliant surface;Forward;Retro;5 reps;Intermittent upper extremity support   Sidestepping Foam/compliant support;Other (comment)   Step Over Hurdles / Cones 6"/12" hurdles ( 2 of each) on airrex beam 4RT forward and sideways             PT Short Term Goals - 06/24/15 0911    PT SHORT TERM GOAL #1   Title Patient will report less frequent dizziness from 1x/week to 1x/every other week in order to return to her  PLOF.    Time 3   Period Weeks   Status Achieved   PT SHORT TERM GOAL #2   Title Patient and caregiver will independently verbalize 4/4 fall precautions in order to reduce the risk for falls.    Baseline Pt was able to recall 2/4 fall precautions    Time 3   Period Weeks   Status Partially Met   PT SHORT TERM GOAL #3   Title Patient and spouse will independently demo full understanding with initial VOR/ habituation HEP with use of handouts.    Time 2   Period Weeks   Status On-going           PT Long Term Goals - 06/24/15 0912    PT LONG TERM GOAL #1   Title Patient will improve DGI score to 24/24 in order to reduce the risk for falls.    Baseline DGI as of 06/24/2015= 24/24    Time 6   Period Weeks   Status Achieved    PT LONG TERM GOAL #2   Title Patient's TUG time will improve to <13 sec without the use of an AD in order to reduce the risk for falls.    Baseline 11 seconds without an AD   Time 5   Period Weeks   Status Achieved   PT LONG TERM GOAL #3   Title Patient will report decreased dizziness intensity to <3/10 on a VAS in order to improve tolerance with ADLs and leisure activities.    Baseline dizziness ranges between a 0-4/10    Time 6   Period Weeks   Status On-going   PT LONG TERM GOAL #4   Title Patient will improve B hip strength to 5/5 MMT in order to improve pelvic stabilization with community ambulation.    Baseline B quad strength improved to 5/5 MMT; B hip abduction improved to 4+/5 MMT    Time 6   Period Weeks   Status On-going               Plan - 07/03/15 0959    Clinical Impression Statement Focused session today on dynamaic and static balance actvities.  Pt without LOB or dizziness reported with any balance actvity.  Able to clear all hurdles with ease while on airex surface.  Improved SLS stance time over 30" for each LE without use of UE.  Pt with questions regarding previously prescribed theraband exercises.  Reveiwed these and patient given additional copy of HEP.  Pt overall progressing well with anticpated discharge approaching.    Pt will benefit from skilled therapeutic intervention in order to improve on the following deficits Abnormal gait;Decreased safety awareness;Dizziness;Decreased balance;Improper body mechanics;Postural dysfunction;Decreased strength   Rehab Potential Good   Clinical Impairments Affecting Rehab Potential pre-existing HA and vertigo; also early stages of dementia per husband    PT Treatment/Interventions ADLs/Self Care Home Management;Balance training;Therapeutic exercise;Therapeutic activities;Functional mobility training;Stair training;Gait training;DME Instruction;Neuromuscular re-education;Patient/family education;Manual  techniques;Passive range of motion;Vestibular   PT Next Visit Plan Re-evaluate next session.    PT Home Exercise Plan given additional copy of postural theraband instructions.    Consulted and Agree with Plan of Care Patient   Family Member Consulted Spouse        Problem List Patient Active Problem List   Diagnosis Date Noted  . Adenomatous polyp 01/02/2014    Teena Irani, PTA/CLT [phone]  07/03/2015, 10:10 AM  [place], [place], [address]  Phone: [phone]   Fax:  [phone]  Name: SHARLIE SHREFFLER MRN: [id number] Date of Birth: 03-01-1941

## 2015-07-08 ENCOUNTER — Ambulatory Visit (HOSPITAL_COMMUNITY): Payer: Medicare Other | Attending: Family Medicine | Admitting: Physical Therapy

## 2015-07-08 DIAGNOSIS — R2681 Unsteadiness on feet: Secondary | ICD-10-CM | POA: Insufficient documentation

## 2015-07-08 DIAGNOSIS — R29898 Other symptoms and signs involving the musculoskeletal system: Secondary | ICD-10-CM | POA: Insufficient documentation

## 2015-07-08 DIAGNOSIS — H832X1 Labyrinthine dysfunction, right ear: Secondary | ICD-10-CM | POA: Diagnosis not present

## 2015-07-08 NOTE — Patient Instructions (Signed)
   SINGLE LEG STANCE - SLS  Stand on one leg and maintain your balance. You can do this at the counter or near the back of the chair as shown in this picture for safety. If this is too easy, you may try doing it with your eyes closed as long as your husband is with you.  Hold for as long as you can, 3 times each side, twice a day.    TANDEM STANCE WITH SUPPORT  Stand in front of a chair, table or counter top for support. Then place the heel of one foot so that it is touching the toes of the other foot. Maintain your balance in this position.   If this is too easy, you may try it with your eyes closed as long as your husband is with you.  Hold as long as you can, twice each side, twice a day.    TANDEM STANCE AND WALK (DO WITH YOUR HUSBAND)  Stand with one foot directly in front of the other so that the toes of one foot touches the heel of the other.   Progress by taking steps with your heel touching your toes with each step.   Maintain your balance. One thing that may help is doing this in a hallway where you have support on both sides.  If this is too easy, you may try it backwards.   Repeat a length of about 15 feet twice, twice a day.    HIP ABDUCTION - SIDELYING  While lying on your side, slowly raise up your top leg to the side. Keep your knee straight and maintain your toes pointed forward the entire time.   The bottom leg can be bent to stabilize your body. It is very important to keep your hips stacked on top of each other and to lead with your heel, not your toes.   Repeat 10-15 times each side, twice a day.

## 2015-07-08 NOTE — Therapy (Signed)
[place], [place], [address] Phone: [phone]   Fax:  [phone]  Physical Therapy Treatment (Discharge)  Patient Details  Name: Tonya Cortez MRN: [id number] Date of Birth: [birth year] Referring Provider: Dr. Lucianne Lei   Encounter Date: July 25, 2015      PT End of Session - 25-Jul-2015 1028    Visit Number 12   Number of Visits 12   Date for PT Re-Evaluation 2015/07/25   Authorization Type Medicare/[employer]    Authorization Time Period 2012-06-12 to 07/25/2015 ( G-codes and progress note completed on 06/24/15)    Authorization - Visit Number 12   Authorization - Number of Visits 20   PT Start Time 6803   PT Stop Time 0927   PT Time Calculation (min) 40 min   Equipment Utilized During Treatment Gait belt   Activity Tolerance Patient tolerated treatment well   Behavior During Therapy [facility] for tasks assessed/performed      Past Medical History  Diagnosis Date  . Bell's palsy   . Arthritis   . Hypercholesterolemia   . Borderline diabetes   . Cancer [facility])     Past Surgical History  Procedure Laterality Date  . Abdominal hysterectomy    . Breast surgery    . Mastectomy Left     abnormal mass in breast, non-cancerous per patient.   . Colonoscopy  10/29/2008    OZY:[id number] examination with some left-sided diverticula/Diminutive cecal polyp, status post cold biopsy removal.  Right colonic mucosa appeared normal. adenomatous  . Colonoscopy N/A 01/17/2014    Procedure: COLONOSCOPY;  Surgeon: Daneil Dolin, MD;  [facility];  Service: Endoscopy;  Laterality: N/A;  1115  . Mastectomy      There were no vitals filed for this visit.  Visit Diagnosis:  Unsteady gait  Unsteadiness on feet  Vestibular hypofunction of right ear  Weakness of both lower extremities      Subjective Assessment - 2015/07/25 0849    Subjective Patient surprised to hear that today is her last day but pleased and  agreeable to this plan; she reports that she feels much better. In terms of her balance, she feels much bettter, reports that "it feels normal". Nothing is really hard for her to do right now, she can still  do what she needs and wants to do through her day.    Pertinent History hx of neck pain and Bell's Palsy    Currently in Pain? No/denies            [facility] PT Assessment - 2015-07-25 0001    Strength   Right Hip Flexion 4/5   Right Hip Extension 4/5   Right Hip ABduction 4+/5   Left Hip Flexion 4+/5   Left Hip Extension 4/5   Left Hip ABduction 4+/5   Right Knee Flexion 5/5   Right Knee Extension 4+/5   Left Knee Flexion 5/5   Left Knee Extension 4+/5   Right Ankle Dorsiflexion 5/5   Left Ankle Dorsiflexion 5/5   Dynamic Gait Index   Level Surface Normal   Change in Gait Speed Normal   Gait with Horizontal Head Turns Normal   Gait with Vertical Head Turns Normal   Gait and Pivot Turn Normal   Step Over Obstacle Normal   Step Around Obstacles Normal   Steps Normal   Total Score 24   DGI comment: completed without an AD   Timed Up and Go Test  Normal TUG (seconds) 10.4   TUG Comments without an AD                     [facility] Adult PT Treatment/Exercise - 07/08/15 0001    Knee/Hip Exercises: Aerobic   Nustep 8 minutes hills 2 resistancer 2   performed while PT designing advanced balance HEP                 PT Education - 07/08/15 1028    Education provided Yes   Education Details fall precautions and footwear, progress with skilled PT services, advanced balance HEP    Person(s) Educated Patient;Spouse   Methods Explanation   Comprehension Verbalized understanding          PT Short Term Goals - 07/08/15 0904    PT SHORT TERM GOAL #1   Title Patient will report less frequent dizziness from 1x/week to 1x/every other week in order to return to her PLOF.    Time 3   Period Weeks   Status Achieved   PT SHORT TERM GOAL #2   Title Patient and  caregiver will independently verbalize 4/4 fall precautions in order to reduce the risk for falls.    Baseline 4/3- able to report 3/4 precautions    Time 3   Period Weeks   Status Partially Met   PT SHORT TERM GOAL #3   Title Patient and spouse will independently demo full understanding with initial VOR/ habituation HEP with use of handouts.    Time 2   Period Weeks   Status Partially Met           PT Long Term Goals - 07/08/15 0906    PT LONG TERM GOAL #1   Title Patient will improve DGI score to 24/24 in order to reduce the risk for falls.    Time 6   Period Weeks   Status Achieved   PT LONG TERM GOAL #2   Title Patient's TUG time will improve to <13 sec without the use of an AD in order to reduce the risk for falls.    Time 5   Period Weeks   Status Achieved   PT LONG TERM GOAL #3   Title Patient will report decreased dizziness intensity to <3/10 on a VAS in order to improve tolerance with ADLs and leisure activities.    Baseline 4/3- occasionally gets over 3/10   Time 6   Period Weeks   Status Partially Met   PT LONG TERM GOAL #4   Title Patient will improve B hip strength to 5/5 MMT in order to improve pelvic stabilization with community ambulation.    Baseline B quad strength improved to 5/5 MMT; B hip abduction improved to 4+/5 MMT    Time 6   Period Weeks   Status On-going   PT LONG TERM GOAL #5   Title Patient and spouse will independently demo full understanding with habituation/VOR HEP in order to ensure proper completion prior to DC from PT.    Baseline 4/3- reports good compliance and no concerns from her or her husband    Time 6   Period Weeks   Status Partially Met               Plan - 07/08/15 0915    Clinical Impression Statement Final re-assessment performed today. Patient continues to demosntrate excellent scores on balance and gait testing as well as very good strength measures, however she does continue to demonstrate some  weakness  around her hips in participate although it is relatively mild. Patient very pleasant and reports she does not have any concerns about her current HEP hwoever states she is not doing any balance exercises at home right now; surprised but  pleased  that today is scheduled to be her last day, agreeable to this as well. Assigned appropirate HEP based around balance today and reviewed with patient. No futher skilled PT needs met at thsi point, DC today.    Pt will benefit from skilled therapeutic intervention in order to improve on the following deficits Abnormal gait;Decreased safety awareness;Dizziness;Decreased balance;Improper body mechanics;Postural dysfunction;Decreased strength   Rehab Potential Good   Clinical Impairments Affecting Rehab Potential pre-existing HA and vertigo; also early stages of dementia per husband    PT Treatment/Interventions ADLs/Self Care Home Management;Balance training;Therapeutic exercise;Therapeutic activities;Functional mobility training;Stair training;Gait training;DME Instruction;Neuromuscular re-education;Patient/family education;Manual techniques;Passive range of motion;Vestibular   PT Next Visit Plan DC today    PT Home Exercise Plan advanced balance HEP    Consulted and Agree with Plan of Care Patient   Family Member Consulted Spouse          G-Codes - 07-Aug-2015 1029    Functional Assessment Tool Used Based on skilled clinical assessment of balance, gait, strength, overall mobility    Functional Limitation Mobility: Walking and moving around   Mobility: Walking and Moving Around Goal Status [phone]) At least 1 percent but less than 20 percent impaired, limited or restricted   Mobility: Walking and Moving Around Discharge Status [phone]) At least 1 percent but less than 20 percent impaired, limited or restricted      Problem List Patient Active Problem List   Diagnosis Date Noted  . Adenomatous polyp 01/02/2014   PHYSICAL THERAPY DISCHARGE SUMMARY  Visits  from Start of Care: 12  Current functional level related to goals / functional outcomes: Patient doing very well overall, reports that she is able to do basically everything she wants and needs to do with no significant limitations right now. Only occasional dizziness around 4/10, otherwise doing much better.    Remaining deficits: Mild muscle weakness, mild unsteadiness    Education / Equipment: Advanced HEP based on balance  Plan: Patient agrees to discharge.  Patient goals were partially met. Patient is being discharged due to being pleased with the current functional level.  ?????        Deniece Ree PT, DPT [facility] [place] [address] [place], [place], [address] Phone: [phone]   Fax:  [phone]  Name: Tonya Cortez MRN: [id number] Date of Birth: 1940/05/02

## 2015-08-01 DIAGNOSIS — R51 Headache: Secondary | ICD-10-CM | POA: Diagnosis not present

## 2015-08-16 ENCOUNTER — Emergency Department (HOSPITAL_COMMUNITY): Payer: Medicare Other

## 2015-08-16 ENCOUNTER — Emergency Department (HOSPITAL_COMMUNITY)
Admission: EM | Admit: 2015-08-16 | Discharge: 2015-08-16 | Disposition: A | Discharge status: Home / Self Care | Payer: Medicare Other | Attending: Emergency Medicine | Admitting: Emergency Medicine

## 2015-08-16 ENCOUNTER — Encounter (HOSPITAL_COMMUNITY): Payer: Self-pay | Admitting: Emergency Medicine

## 2015-08-16 DIAGNOSIS — K625 Hemorrhage of anus and rectum: Secondary | ICD-10-CM

## 2015-08-16 DIAGNOSIS — E7800 Pure hypercholesterolemia, unspecified: Secondary | ICD-10-CM | POA: Diagnosis not present

## 2015-08-16 DIAGNOSIS — Z853 Personal history of malignant neoplasm of breast: Secondary | ICD-10-CM | POA: Diagnosis not present

## 2015-08-16 DIAGNOSIS — R935 Abnormal findings on diagnostic imaging of other abdominal regions, including retroperitoneum: Secondary | ICD-10-CM | POA: Diagnosis not present

## 2015-08-16 DIAGNOSIS — Z7982 Long term (current) use of aspirin: Secondary | ICD-10-CM | POA: Insufficient documentation

## 2015-08-16 DIAGNOSIS — I776 Arteritis, unspecified: Secondary | ICD-10-CM | POA: Diagnosis not present

## 2015-08-16 DIAGNOSIS — Z79899 Other long term (current) drug therapy: Secondary | ICD-10-CM | POA: Insufficient documentation

## 2015-08-16 HISTORY — DX: Other amnesia: R41.3

## 2015-08-16 HISTORY — DX: Diverticulosis of intestine, part unspecified, without perforation or abscess without bleeding: K57.90

## 2015-08-16 HISTORY — DX: Dizziness and giddiness: R42

## 2015-08-16 HISTORY — DX: Unsteadiness on feet: R26.81

## 2015-08-16 LAB — COMPREHENSIVE METABOLIC PANEL
ALBUMIN: 4.1 g/dL (ref 3.5–5.0)
ALT: 30 U/L (ref 14–54)
AST: 31 U/L (ref 15–41)
Alkaline Phosphatase: 55 U/L (ref 38–126)
Anion gap: 5 (ref 5–15)
BILIRUBIN TOTAL: 0.6 mg/dL (ref 0.3–1.2)
BUN: 10 mg/dL (ref 6–20)
CHLORIDE: 103 mmol/L (ref 101–111)
CO2: 29 mmol/L (ref 22–32)
CREATININE: 0.88 mg/dL (ref 0.44–1.00)
Calcium: 8.9 mg/dL (ref 8.9–10.3)
GFR calc Af Amer: >60 mL/min (ref >60)
GLUCOSE: 116 mg/dL — AB (ref 65–99)
POTASSIUM: 3.7 mmol/L (ref 3.5–5.1)
Sodium: 137 mmol/L (ref 135–145)
TOTAL PROTEIN: 7.6 g/dL (ref 6.5–8.1)

## 2015-08-16 LAB — TYPE AND SCREEN
ABO/RH(D): O POS
Antibody Screen: NEGATIVE

## 2015-08-16 LAB — CBC
HEMATOCRIT: 46.7 % — AB (ref 36.0–46.0)
Hemoglobin: 15.4 g/dL — ABNORMAL HIGH (ref 12.0–15.0)
MCH: 28.7 pg (ref 26.0–34.0)
MCHC: 33 g/dL (ref 30.0–36.0)
MCV: 87 fL (ref 78.0–100.0)
PLATELETS: 192 10*3/uL (ref 150–400)
RBC: 5.37 MIL/uL — ABNORMAL HIGH (ref 3.87–5.11)
RDW: 14.3 % (ref 11.5–15.5)
WBC: 5.5 10*3/uL (ref 4.0–10.5)

## 2015-08-16 NOTE — ED Notes (Signed)
Pt states she had an episode of bright red blood when she wiped after having a bowel movement this morning.  Denies any pain.

## 2015-08-16 NOTE — ED Notes (Signed)
Patient states she noticed "bright red" blood when she had a bowel movement today. Patent denies pain at this time, denies N/V, denies weakness.

## 2015-08-16 NOTE — ED Provider Notes (Signed)
CSN: [id number]     Arrival date & time 08/16/15  1239 History   First MD Initiated Contact with Patient 08/16/15 1445     Chief Complaint  Patient presents with  . Rectal Bleeding      Patient is a 75 y.o. female presenting with hematochezia. The history is provided by the patient and the spouse. The history is limited by the condition of the patient (hx "memory difficulties" per husband).  Rectal Bleeding Pt was seen at 1450. Per pt and her husband: Pt had small BM this morning and noticed "red blood" on the toilet paper when she wiped afterwards. Pt denies rectal pain, no N/V/D, no abd pain, no back pain, no bloody or black stool.   Past Medical History  Diagnosis Date  . Bell's palsy   . Arthritis   . Hypercholesterolemia   . Borderline diabetes   . Cancer (HCC)     breast  . Diverticulosis   . Vertigo   . Unsteady gait   . Memory difficulties    Past Surgical History  Procedure Laterality Date  . Abdominal hysterectomy    . Breast surgery    . Mastectomy Left     abnormal mass in breast, non-cancerous per patient.   . Colonoscopy  10/29/2008    [id number] examination with some left-sided diverticula/Diminutive cecal polyp, status post cold biopsy removal.  Right colonic mucosa appeared normal. adenomatous  . Colonoscopy N/A 01/17/2014    Procedure: COLONOSCOPY;  Surgeon: Daneil Dolin, MD;  Location[facility];  Service: Endoscopy;  Laterality: N/A;  1115  . Mastectomy     Family History  Problem Relation Age of Onset  . Colon cancer Neg Hx    Social History  Substance Use Topics  . Smoking status: Never Smoker   . Smokeless tobacco: Never Used  . Alcohol Use: No   OB History    Gravida Para Term Preterm AB TAB SAB Ectopic Multiple Living   5 5 3 2      3      Review of Systems  Unable to perform ROS: Dementia  Gastrointestinal: Positive for hematochezia.     Allergies  Review of patient's allergies indicates no known allergies.  Home Medications    Prior to Admission medications   Medication Sig Start Date End Date Taking? Authorizing Provider  ALPRAZolam (XANAX) 0.25 MG tablet Take 0.25-0.5 mg by mouth at bedtime.   Yes Historical Provider, MD  Aspirin-Acetaminophen-Caffeine (GOODY HEADACHE PO) Take 1 Package by mouth daily as needed (headache).   Yes Historical Provider, MD  donepezil (ARICEPT) 10 MG tablet Take 10 mg by mouth at bedtime.   Yes Historical Provider, MD  gabapentin (NEURONTIN) 100 MG capsule Take 100 mg by mouth daily.   Yes Historical Provider, MD  lansoprazole (PREVACID) 15 MG capsule Take 15 mg by mouth every morning.   Yes Historical Provider, MD  meclizine (ANTIVERT) 25 MG tablet Take 1 tablet (25 mg total) by mouth every 6 (six) hours as needed for dizziness or nausea. Patient taking differently: Take 25 mg by mouth 3 (three) times daily.  02/03/14  Yes Riki Altes, MD  potassium chloride SA (K-DUR,KLOR-CON) 20 MEQ tablet Take 20 mEq by mouth daily.   Yes Historical Provider, MD  pramipexole (MIRAPEX) 0.125 MG tablet Take 0.125 mg by mouth at bedtime.   Yes Historical Provider, MD  rosuvastatin (CRESTOR) 20 MG tablet Take 20 mg by mouth daily.   Yes Historical Provider, MD   BP  140/89 mmHg  Pulse 64  Temp(Src) 97.9 F (36.6 C) (Oral)  Resp 18  Ht 5\' 1"  (1.549 m)  Wt 160 lb (72.576 kg)  BMI 30.25 kg/m2  SpO2 98% Physical Exam  1455: Physical examination:  Nursing notes reviewed; Vital signs and O2 SAT reviewed;  Constitutional: Well developed, Well nourished, Well hydrated, In no acute distress; Head:  Normocephalic, atraumatic; Eyes: EOMI, PERRL, No scleral icterus; ENMT: Mouth and pharynx normal, Mucous membranes moist; Neck: Supple, Full range of motion, No lymphadenopathy; Cardiovascular: Regular rate and rhythm, No gallop; Respiratory: Breath sounds clear & equal bilaterally, No wheezes.  Speaking full sentences with ease, Normal respiratory effort/excursion; Chest: Nontender, Movement normal; Abdomen:  Soft, Nontender, Nondistended, Normal bowel sounds. Rectal exam performed w/permission of pt and ED RN chaperone present.  Anal tone normal.  Non-tender, soft brown stool in rectal vault, heme neg.  No fissures, +external hemorrhoid without thrombosis or bleeding, no palp masses.;;; Genitourinary: No CVA tenderness; Extremities: Pulses normal, No tenderness, No edema, No calf edema or asymmetry.; Neuro: Awake, alert, mildly confused re: time, events per baseline, per husband at bedside. Major CN grossly intact. No facial droop. Speech clear. No gross focal motor or sensory deficits in extremities.; Skin: Color normal, Warm, Dry.   ED Course  Procedures (including critical care time) Labs Review  Imaging Review  I have personally reviewed and evaluated these images and lab results as part of my medical decision-making.   EKG Interpretation None      MDM  MDM Reviewed: previous chart, nursing note and vitals Reviewed previous: labs Interpretation: labs and x-ray     Results for orders placed or performed during the hospital encounter of 08/16/15  Comprehensive metabolic panel  Result Value Ref Range   Sodium 137 135 - 145 mmol/L   Potassium 3.7 3.5 - 5.1 mmol/L   Chloride 103 101 - 111 mmol/L   CO2 29 22 - 32 mmol/L   Glucose, Bld 116 (H) 65 - 99 mg/dL   BUN 10 6 - 20 mg/dL   Creatinine, Ser 0.88 0.44 - 1.00 mg/dL   Calcium 8.9 8.9 - 10.3 mg/dL   Total Protein 7.6 6.5 - 8.1 g/dL   Albumin 4.1 3.5 - 5.0 g/dL   AST 31 15 - 41 U/L   ALT 30 14 - 54 U/L   Alkaline Phosphatase 55 38 - 126 U/L   Total Bilirubin 0.6 0.3 - 1.2 mg/dL   GFR calc non Af Amer >60 >60 mL/min   GFR calc Af Amer >60 >60 mL/min   Anion gap 5 5 - 15  CBC  Result Value Ref Range   WBC 5.5 4.0 - 10.5 K/uL   RBC 5.37 (H) 3.87 - 5.11 MIL/uL   Hemoglobin 15.4 (H) 12.0 - 15.0 g/dL   HCT 46.7 (H) 36.0 - 46.0 %   MCV 87.0 78.0 - 100.0 fL   MCH 28.7 26.0 - 34.0 pg   MCHC 33.0 30.0 - 36.0 g/dL   RDW 14.3 11.5  - 15.5 %   Platelets 192 150 - 400 K/uL  Type and screen [facility]  Result Value Ref Range   ABO/RH(D) O POS    Antibody Screen NEG    Sample Expiration 08/19/2015    Dg Abd Acute W/chest 08/16/2015  CLINICAL DATA:  Blood after bowel movement.  Dementia. EXAM: DG ABDOMEN ACUTE W/ 1V CHEST COMPARISON:  Chest x-ray 11/17/2008 FINDINGS: Lungs are adequately inflated and otherwise clear. Cardiomediastinal silhouette is within normal.  There is mild calcified plaque over the thoracic aorta. Evidence of previous left mastectomy and axillary node dissection. There are mild degenerative changes of the spine. Abdominal images demonstrate mild fecal retention throughout the colon without evidence of obstruction. No free peritoneal air. Mild degenerate change of the spine and hips. IMPRESSION: Nonobstructive bowel gas pattern with mild fecal retention throughout the colon. No acute cardiopulmonary disease. Electronically Signed   By: Marin Olp M.D.   On: 08/16/2015 15:35   Results for KENYETTA, PERT (MRN [id number]) as of 08/16/2015 16:09  Ref. Range 04/18/2008 14:19 11/10/2013 15:43 03/24/2015 12:40 08/16/2015 13:10  Hemoglobin Latest Ref Range: 12.0-15.0 g/dL 14.8 16.2 (H) 15.5 (H) 15.4 (H)  HCT Latest Ref Range: 36.0-46.0 % 44.2 47.4 (H) 45.5 46.7 (H)    1600:  H/H per baseline. Stool heme negative. Abd remains benign, VSS. Tx stool softeners, f/u with her GI Dr. Gala Romney. Dx and testing d/w pt and family.  Questions answered.  Verb understanding, agreeable to d/c home with outpt f/u.    Francine Graven, DO 08/19/15 1644

## 2015-08-16 NOTE — Discharge Instructions (Signed)
Take your usual prescriptions as previously directed. Begin to take over the counter stool softener (colace), as directed on packaging, for the next month. Call your regular GI doctor today to schedule a follow up appointment within the next 4 days.  Return to the Emergency Department immediately if worsening.

## 2015-09-12 DIAGNOSIS — R51 Headache: Secondary | ICD-10-CM | POA: Diagnosis not present

## 2015-09-20 DIAGNOSIS — Z6830 Body mass index (BMI) 30.0-30.9, adult: Secondary | ICD-10-CM | POA: Diagnosis not present

## 2015-09-20 DIAGNOSIS — I10 Essential (primary) hypertension: Secondary | ICD-10-CM | POA: Diagnosis not present

## 2015-09-20 DIAGNOSIS — R739 Hyperglycemia, unspecified: Secondary | ICD-10-CM | POA: Diagnosis not present

## 2015-09-20 DIAGNOSIS — G44209 Tension-type headache, unspecified, not intractable: Secondary | ICD-10-CM | POA: Diagnosis not present

## 2015-09-20 DIAGNOSIS — E782 Mixed hyperlipidemia: Secondary | ICD-10-CM | POA: Diagnosis not present

## 2015-09-20 DIAGNOSIS — G441 Vascular headache, not elsewhere classified: Secondary | ICD-10-CM | POA: Diagnosis not present

## 2015-12-10 ENCOUNTER — Other Ambulatory Visit: Payer: Self-pay

## 2015-12-16 DIAGNOSIS — R51 Headache: Secondary | ICD-10-CM | POA: Diagnosis not present

## 2015-12-23 DIAGNOSIS — Z0000 Encounter for general adult medical examination without abnormal findings: Secondary | ICD-10-CM | POA: Diagnosis not present

## 2016-01-21 DIAGNOSIS — Z23 Encounter for immunization: Secondary | ICD-10-CM | POA: Diagnosis not present

## 2016-01-21 DIAGNOSIS — I10 Essential (primary) hypertension: Secondary | ICD-10-CM | POA: Diagnosis not present

## 2016-01-21 DIAGNOSIS — Z79899 Other long term (current) drug therapy: Secondary | ICD-10-CM | POA: Diagnosis not present

## 2016-01-21 DIAGNOSIS — G44209 Tension-type headache, unspecified, not intractable: Secondary | ICD-10-CM | POA: Diagnosis not present

## 2016-01-21 DIAGNOSIS — G309 Alzheimer's disease, unspecified: Secondary | ICD-10-CM | POA: Diagnosis not present

## 2016-03-04 DIAGNOSIS — G40209 Localization-related (focal) (partial) symptomatic epilepsy and epileptic syndromes with complex partial seizures, not intractable, without status epilepticus: Secondary | ICD-10-CM | POA: Diagnosis not present

## 2016-03-04 DIAGNOSIS — I10 Essential (primary) hypertension: Secondary | ICD-10-CM | POA: Diagnosis not present

## 2016-03-04 DIAGNOSIS — H8309 Labyrinthitis, unspecified ear: Secondary | ICD-10-CM | POA: Diagnosis not present

## 2016-03-04 DIAGNOSIS — R413 Other amnesia: Secondary | ICD-10-CM | POA: Diagnosis not present

## 2016-03-09 DIAGNOSIS — Z1212 Encounter for screening for malignant neoplasm of rectum: Secondary | ICD-10-CM | POA: Diagnosis not present

## 2016-03-09 DIAGNOSIS — Z1211 Encounter for screening for malignant neoplasm of colon: Secondary | ICD-10-CM | POA: Diagnosis not present

## 2016-05-05 DIAGNOSIS — I10 Essential (primary) hypertension: Secondary | ICD-10-CM | POA: Diagnosis not present

## 2016-05-05 DIAGNOSIS — L98499 Non-pressure chronic ulcer of skin of other sites with unspecified severity: Secondary | ICD-10-CM | POA: Diagnosis not present

## 2016-06-16 DIAGNOSIS — R51 Headache: Secondary | ICD-10-CM | POA: Diagnosis not present

## 2016-09-02 DIAGNOSIS — G309 Alzheimer's disease, unspecified: Secondary | ICD-10-CM | POA: Diagnosis not present

## 2016-09-02 DIAGNOSIS — G40209 Localization-related (focal) (partial) symptomatic epilepsy and epileptic syndromes with complex partial seizures, not intractable, without status epilepticus: Secondary | ICD-10-CM | POA: Diagnosis not present

## 2016-09-02 DIAGNOSIS — R739 Hyperglycemia, unspecified: Secondary | ICD-10-CM | POA: Diagnosis not present

## 2016-09-02 DIAGNOSIS — R413 Other amnesia: Secondary | ICD-10-CM | POA: Diagnosis not present

## 2016-10-12 ENCOUNTER — Emergency Department (HOSPITAL_COMMUNITY): Payer: Medicare Other

## 2016-10-12 ENCOUNTER — Emergency Department (HOSPITAL_COMMUNITY)
Admission: EM | Admit: 2016-10-12 | Discharge: 2016-10-12 | Disposition: A | Discharge status: Home / Self Care | Payer: Medicare Other | Attending: Emergency Medicine | Admitting: Emergency Medicine

## 2016-10-12 ENCOUNTER — Encounter (HOSPITAL_COMMUNITY): Payer: Self-pay | Admitting: Emergency Medicine

## 2016-10-12 DIAGNOSIS — R42 Dizziness and giddiness: Secondary | ICD-10-CM | POA: Diagnosis not present

## 2016-10-12 DIAGNOSIS — R112 Nausea with vomiting, unspecified: Secondary | ICD-10-CM

## 2016-10-12 DIAGNOSIS — Z853 Personal history of malignant neoplasm of breast: Secondary | ICD-10-CM | POA: Diagnosis not present

## 2016-10-12 LAB — COMPREHENSIVE METABOLIC PANEL
ALBUMIN: 3.5 g/dL (ref 3.5–5.0)
ALK PHOS: 45 U/L (ref 38–126)
ALT: 24 U/L (ref 14–54)
ANION GAP: 8 (ref 5–15)
AST: 27 U/L (ref 15–41)
BILIRUBIN TOTAL: 0.9 mg/dL (ref 0.3–1.2)
BUN: 9 mg/dL (ref 6–20)
CALCIUM: 7.8 mg/dL — AB (ref 8.9–10.3)
CO2: 25 mmol/L (ref 22–32)
Chloride: 106 mmol/L (ref 101–111)
Creatinine, Ser: 0.79 mg/dL (ref 0.44–1.00)
GFR calc Af Amer: >60 mL/min (ref >60)
GLUCOSE: 109 mg/dL — AB (ref 65–99)
Potassium: 3.1 mmol/L — ABNORMAL LOW (ref 3.5–5.1)
Sodium: 139 mmol/L (ref 135–145)
TOTAL PROTEIN: 6.6 g/dL (ref 6.5–8.1)

## 2016-10-12 LAB — URINALYSIS, ROUTINE W REFLEX MICROSCOPIC
BILIRUBIN URINE: NEGATIVE
GLUCOSE, UA: NEGATIVE mg/dL
Hgb urine dipstick: NEGATIVE
KETONES UR: 5 mg/dL — AB
Leukocytes, UA: NEGATIVE
NITRITE: NEGATIVE
PH: 8 (ref 5.0–8.0)
Protein, ur: NEGATIVE mg/dL
SPECIFIC GRAVITY, URINE: 1.011 (ref 1.005–1.030)

## 2016-10-12 LAB — CBC WITH DIFFERENTIAL/PLATELET
BASOS PCT: 0 %
Basophils Absolute: 0 10*3/uL (ref 0.0–0.1)
EOS ABS: 0 10*3/uL (ref 0.0–0.7)
Eosinophils Relative: 0 %
HCT: 43.3 % (ref 36.0–46.0)
Hemoglobin: 14.5 g/dL (ref 12.0–15.0)
LYMPHS ABS: 1.3 10*3/uL (ref 0.7–4.0)
Lymphocytes Relative: 18 %
MCH: 29.1 pg (ref 26.0–34.0)
MCHC: 33.5 g/dL (ref 30.0–36.0)
MCV: 86.9 fL (ref 78.0–100.0)
MONOS PCT: 6 %
Monocytes Absolute: 0.4 10*3/uL (ref 0.1–1.0)
Neutro Abs: 5.6 10*3/uL (ref 1.7–7.7)
Neutrophils Relative %: 76 %
PLATELETS: 157 10*3/uL (ref 150–400)
RBC: 4.98 MIL/uL (ref 3.87–5.11)
RDW: 13.9 % (ref 11.5–15.5)
WBC: 7.3 10*3/uL (ref 4.0–10.5)

## 2016-10-12 LAB — TROPONIN I: Troponin I: <0.03 ng/mL (ref <0.03)

## 2016-10-12 LAB — LIPASE, BLOOD: LIPASE: 21 U/L (ref 11–51)

## 2016-10-12 MED ORDER — SODIUM CHLORIDE 0.9 % IV BOLUS (SEPSIS)
500.0000 mL | Freq: Once | INTRAVENOUS | Status: AC
  Administered 2016-10-12: 500 mL via INTRAVENOUS

## 2016-10-12 MED ORDER — ONDANSETRON HCL 4 MG/2ML IJ SOLN
4.0000 mg | Freq: Once | INTRAMUSCULAR | Status: AC
  Administered 2016-10-12: 4 mg via INTRAVENOUS
  Filled 2016-10-12: qty 2

## 2016-10-12 MED ORDER — ONDANSETRON 4 MG PO TBDP: 4.0000 mg | ORAL_TABLET | Freq: Three times a day (TID) | ORAL | 0 refills | Status: DC | PRN

## 2016-10-12 MED ORDER — POTASSIUM CHLORIDE CRYS ER 20 MEQ PO TBCR
40.0000 meq | EXTENDED_RELEASE_TABLET | Freq: Once | ORAL | Status: AC
  Administered 2016-10-12: 40 meq via ORAL
  Filled 2016-10-12: qty 2

## 2016-10-12 NOTE — ED Provider Notes (Signed)
Second troponin negative. Potassium slightly low. These findings were discussed with the patient. Discharge medications Zofran 4 mg ODT.   Nat Christen, MD 10/12/16 1901

## 2016-10-12 NOTE — ED Triage Notes (Signed)
Pt reports being on the way to a funeral and having sudden onset of vomiting.  Pt denies pain, dizziness, or headache.  States she just feels very sick.

## 2016-10-12 NOTE — ED Notes (Signed)
Dr Lacinda Axon Notified of troponin. Preparing for discharge

## 2016-10-12 NOTE — Discharge Instructions (Signed)
Your second chemical test for a heart attack was negative. Prescription for nausea medicine. Follow-up your primary care doctor.

## 2016-10-12 NOTE — ED Provider Notes (Signed)
[place] DEPT Provider Note   CSN: [id number] Arrival date & time: 10/12/16  1244     History   Chief Complaint Chief Complaint  Patient presents with  . Emesis    HPI Tonya Cortez is a 76 y.o. female.  The history is provided by the patient, the spouse and a relative. The history is limited by the condition of the patient ("memory difficulties").  Emesis      Pt was seen at 1300.  Per pt and her family: Pt states she was nauseated when she woke up this morning approximately 0830. Pt's husband believes she "ate a little breakfast" despite feeling nauseated. While in the car on the way to a funeral, pt c/o nausea and began to vomit. Pt denies any symptoms currently. Denies CP/SOB, no abd pain, no diarrhea, no black or blood in emesis.   Past Medical History:  Diagnosis Date  . Arthritis   . Bell's palsy   . Borderline diabetes   . Cancer (HCC)    breast  . Diverticulosis   . Hypercholesterolemia   . Memory difficulties   . Unsteady gait   . Vertigo     Patient Active Problem List   Diagnosis Date Noted  . Adenomatous polyp 01/02/2014    Past Surgical History:  Procedure Laterality Date  . ABDOMINAL HYSTERECTOMY    . BREAST SURGERY    . COLONOSCOPY  10/29/2008   QIW:LNLGXQ examination with some left-sided diverticula/Diminutive cecal polyp, status post cold biopsy removal.  Right colonic mucosa appeared normal. adenomatous  . COLONOSCOPY N/A 01/17/2014   Procedure: COLONOSCOPY;  Surgeon: Daneil Dolin, MD;  Location: [facility];  Service: Endoscopy;  Laterality: N/A;  1115  . MASTECTOMY Left    abnormal mass in breast, non-cancerous per patient.   Marland Kitchen MASTECTOMY      OB History    Gravida Para Term Preterm AB Living   5 5 3 2   3    SAB TAB Ectopic Multiple Live Births                   Home Medications    Prior to Admission medications   Medication Sig Start Date End Date Taking? Authorizing Provider  ALPRAZolam (XANAX) 0.25 MG tablet  Take 0.25-0.5 mg by mouth at bedtime.    [provider]  Aspirin-Acetaminophen-Caffeine (GOODY HEADACHE PO) Take 1 Package by mouth daily as needed (headache).    [provider]  donepezil (ARICEPT) 10 MG tablet Take 10 mg by mouth at bedtime.    [provider]  gabapentin (NEURONTIN) 100 MG capsule Take 100 mg by mouth daily.    [provider]  lansoprazole (PREVACID) 15 MG capsule Take 15 mg by mouth every morning.    [provider]  meclizine (ANTIVERT) 25 MG tablet Take 1 tablet (25 mg total) by mouth every 6 (six) hours as needed for dizziness or nausea. Patient taking differently: Take 25 mg by mouth 3 (three) times daily.  02/03/14   Riki Altes, MD  potassium chloride SA (K-DUR,KLOR-CON) 20 MEQ tablet Take 20 mEq by mouth daily.    [provider]  pramipexole (MIRAPEX) 0.125 MG tablet Take 0.125 mg by mouth at bedtime.    [provider]  rosuvastatin (CRESTOR) 20 MG tablet Take 20 mg by mouth daily.    [provider]    Family History Family History  Problem Relation Age of Onset  . Colon cancer Neg Hx  Social History Social History  Substance Use Topics  . Smoking status: Never Smoker  . Smokeless tobacco: Never Used  . Alcohol use No     Allergies   Patient has no known allergies.   Review of Systems Review of Systems  Unable to perform ROS: Other  Gastrointestinal: Positive for vomiting.     Physical Exam Updated Vital Signs BP (!) 153/75 (BP Location: Right Arm)   Pulse 65   Temp (!) 97.4 F (36.3 C) (Oral)   Resp 18   SpO2 94%   15:16:07 Orthostatic Vital Signs RG  Orthostatic Lying   BP- Lying: 129/84  Pulse- Lying: 65      Orthostatic Sitting  BP- Sitting: 143/81  Pulse- Sitting: 68      Orthostatic Standing at 0 minutes  BP- Standing at 0 minutes: 160/90  Pulse- Standing at 0 minutes: 72     Physical Exam 1305: Physical examination:  Nursing notes  reviewed; Vital signs and O2 SAT reviewed;  Constitutional: Well developed, Well nourished, Well hydrated, In no acute distress; Head:  Normocephalic, atraumatic; Eyes: EOMI, PERRL, No scleral icterus; ENMT: Mouth and pharynx normal, Mucous membranes moist; Neck: Supple, Full range of motion, No lymphadenopathy; Cardiovascular: Regular rate and rhythm, No gallop; Respiratory: Breath sounds clear & equal bilaterally, No wheezes.  Speaking full sentences with ease, Normal respiratory effort/excursion; Chest: Nontender, Movement normal; Abdomen: Soft, Nontender, Nondistended, Normal bowel sounds; Genitourinary: No CVA tenderness; Extremities: Pulses normal, No tenderness, No edema, No calf edema or asymmetry.; Neuro: Awake, alert, confused re: events per baseline. Major CN grossly intact. No facial droop. Speech clear. No gross focal motor or sensory deficits in extremities.; Skin: Color normal, Warm, Dry.   ED Treatments / Results  Labs (all labs ordered are listed, but only abnormal results are displayed)   EKG  EKG Interpretation  Date/Time:  Monday October 12 2016 13:00:33 EDT Ventricular Rate:  66 PR Interval:    QRS Duration: 78 QT Interval:  459 QTC Calculation: 481 R Axis:   16 Text Interpretation:  Sinus rhythm Low voltage, precordial leads Abnormal R-wave progression, early transition Baseline wander Artifact repeat tracing suggested Confirmed by [facility]  MD, Nunzio Cory [phone]) on 10/12/2016 1:05:22 PM        EKG Interpretation  Date/Time:  Monday October 12 2016 13:28:39 EDT Ventricular Rate:  60 PR Interval:    QRS Duration: 77 QT Interval:  448 QTC Calculation: 448 R Axis:   18 Text Interpretation:  Sinus rhythm Low voltage, precordial leads Abnormal R-wave progression, early transition Borderline T abnormalities, anterior leads When compared with ECG of 02/03/2014 No significant change was found Confirmed by [facility]  MD, Nunzio Cory [id number]) on 10/12/2016 1:34:57 PM         Radiology   Procedures Procedures (including critical care time)  Medications Ordered in ED Medications  sodium chloride 0.9 % bolus 500 mL (not administered)     Initial Impression / Assessment and Plan / ED Course  I have reviewed the triage vital signs and the nursing notes.  Pertinent labs & imaging results that were available during my care of the patient were reviewed by me and considered in my medical decision making (see chart for details).  MDM Reviewed: previous chart, nursing note and vitals Reviewed previous: labs and ECG Interpretation: labs, ECG and x-ray   Results for orders placed or performed during the hospital encounter of 10/12/16  Comprehensive metabolic panel  Result Value Ref Range   Sodium 139 135 -  145 mmol/L   Potassium 3.1 (L) 3.5 - 5.1 mmol/L   Chloride 106 101 - 111 mmol/L   CO2 25 22 - 32 mmol/L   Glucose, Bld 109 (H) 65 - 99 mg/dL   BUN 9 6 - 20 mg/dL   Creatinine, Ser 0.79 0.44 - 1.00 mg/dL   Calcium 7.8 (L) 8.9 - 10.3 mg/dL   Total Protein 6.6 6.5 - 8.1 g/dL   Albumin 3.5 3.5 - 5.0 g/dL   AST 27 15 - 41 U/L   ALT 24 14 - 54 U/L   Alkaline Phosphatase 45 38 - 126 U/L   Total Bilirubin 0.9 0.3 - 1.2 mg/dL   GFR calc non Af Amer >60 >60 mL/min   GFR calc Af Amer >60 >60 mL/min   Anion gap 8 5 - 15  Lipase, blood  Result Value Ref Range   Lipase 21 11 - 51 U/L  Troponin I  Result Value Ref Range   Troponin I <0.03 <0.03 ng/mL  CBC with Differential  Result Value Ref Range   WBC 7.3 4.0 - 10.5 K/uL   RBC 4.98 3.87 - 5.11 MIL/uL   Hemoglobin 14.5 12.0 - 15.0 g/dL   HCT 43.3 36.0 - 46.0 %   MCV 86.9 78.0 - 100.0 fL   MCH 29.1 26.0 - 34.0 pg   MCHC 33.5 30.0 - 36.0 g/dL   RDW 13.9 11.5 - 15.5 %   Platelets 157 150 - 400 K/uL   Neutrophils Relative % 76 %   Neutro Abs 5.6 1.7 - 7.7 K/uL   Lymphocytes Relative 18 %   Lymphs Abs 1.3 0.7 - 4.0 K/uL   Monocytes Relative 6 %   Monocytes Absolute 0.4 0.1 - 1.0 K/uL    Eosinophils Relative 0 %   Eosinophils Absolute 0.0 0.0 - 0.7 K/uL   Basophils Relative 0 %   Basophils Absolute 0.0 0.0 - 0.1 K/uL   Dg Abd Acute W/chest Result Date: 10/12/2016 CLINICAL DATA:  Nausea/vomiting, dizziness EXAM: DG ABDOMEN ACUTE W/ 1V CHEST COMPARISON:  08/16/2015 FINDINGS: Mild bibasilar opacities, likely atelectasis. No focal consolidation. No pleural effusion or pneumothorax. The heart is normal in size. Status post left mastectomy with left axillary lymph node dissection. Nonobstructive bowel gas pattern. No evidence of free air under the diaphragm on the upright view. IMPRESSION: No evidence of acute cardiopulmonary disease. No evidence of small bowel obstruction or free air. Electronically Signed   By: Julian Hy M.D.   On: 10/12/2016 14:51    1615:  Potassium ordered PO. Not orthostatic on VS. Pt will need PO challenge, ambulation. UA and 2nd trop pending. Sign out to Dr. Lacinda Axon.     Final Clinical Impressions(s) / ED Diagnoses   Final diagnoses:  None    New Prescriptions New Prescriptions   No medications on file     Francine Graven, DO 10/12/16 1623

## 2016-10-12 NOTE — ED Notes (Signed)
Pt ambulated in hall well with steady gait

## 2016-10-14 LAB — URINE CULTURE: CULTURE: NO GROWTH

## 2016-10-21 ENCOUNTER — Emergency Department (HOSPITAL_COMMUNITY): Payer: Medicare Other

## 2016-10-21 ENCOUNTER — Encounter (HOSPITAL_COMMUNITY): Payer: Self-pay | Admitting: Cardiology

## 2016-10-21 ENCOUNTER — Emergency Department (HOSPITAL_COMMUNITY)
Admission: EM | Admit: 2016-10-21 | Discharge: 2016-10-21 | Disposition: A | Discharge status: Home / Self Care | Payer: Medicare Other | Attending: Emergency Medicine | Admitting: Emergency Medicine

## 2016-10-21 DIAGNOSIS — R110 Nausea: Secondary | ICD-10-CM | POA: Diagnosis not present

## 2016-10-21 DIAGNOSIS — K5900 Constipation, unspecified: Secondary | ICD-10-CM | POA: Diagnosis not present

## 2016-10-21 DIAGNOSIS — R252 Cramp and spasm: Secondary | ICD-10-CM | POA: Diagnosis not present

## 2016-10-21 DIAGNOSIS — Z79899 Other long term (current) drug therapy: Secondary | ICD-10-CM | POA: Diagnosis not present

## 2016-10-21 LAB — COMPREHENSIVE METABOLIC PANEL
ALK PHOS: 53 U/L (ref 38–126)
ALT: 21 U/L (ref 14–54)
AST: 26 U/L (ref 15–41)
Albumin: 4.1 g/dL (ref 3.5–5.0)
Anion gap: 10 (ref 5–15)
BUN: 9 mg/dL (ref 6–20)
CALCIUM: 9.3 mg/dL (ref 8.9–10.3)
CHLORIDE: 102 mmol/L (ref 101–111)
CO2: 28 mmol/L (ref 22–32)
CREATININE: 0.97 mg/dL (ref 0.44–1.00)
GFR calc non Af Amer: 55 mL/min — ABNORMAL LOW (ref >60)
Glucose, Bld: 109 mg/dL — ABNORMAL HIGH (ref 65–99)
Potassium: 3.8 mmol/L (ref 3.5–5.1)
SODIUM: 140 mmol/L (ref 135–145)
Total Bilirubin: 0.8 mg/dL (ref 0.3–1.2)
Total Protein: 7.6 g/dL (ref 6.5–8.1)

## 2016-10-21 LAB — CBC WITH DIFFERENTIAL/PLATELET
BASOS ABS: 0 10*3/uL (ref 0.0–0.1)
Basophils Relative: 0 %
EOS PCT: 0 %
Eosinophils Absolute: 0 10*3/uL (ref 0.0–0.7)
HCT: 47.6 % — ABNORMAL HIGH (ref 36.0–46.0)
HEMOGLOBIN: 15.9 g/dL — AB (ref 12.0–15.0)
LYMPHS ABS: 0.9 10*3/uL (ref 0.7–4.0)
LYMPHS PCT: 5 %
MCH: 29.6 pg (ref 26.0–34.0)
MCHC: 33.4 g/dL (ref 30.0–36.0)
MCV: 88.5 fL (ref 78.0–100.0)
Monocytes Absolute: 1.3 10*3/uL — ABNORMAL HIGH (ref 0.1–1.0)
Monocytes Relative: 8 %
NEUTROS PCT: 87 %
Neutro Abs: 13.8 10*3/uL — ABNORMAL HIGH (ref 1.7–7.7)
PLATELETS: 164 10*3/uL (ref 150–400)
RBC: 5.38 MIL/uL — AB (ref 3.87–5.11)
RDW: 14.2 % (ref 11.5–15.5)
WBC: 16 10*3/uL — AB (ref 4.0–10.5)

## 2016-10-21 LAB — LIPASE, BLOOD: Lipase: 28 U/L (ref 11–51)

## 2016-10-21 LAB — MAGNESIUM: MAGNESIUM: 2.2 mg/dL (ref 1.7–2.4)

## 2016-10-21 MED ORDER — SODIUM CHLORIDE 0.9 % IV BOLUS (SEPSIS)
1000.0000 mL | Freq: Once | INTRAVENOUS | Status: AC
  Administered 2016-10-21: 1000 mL via INTRAVENOUS

## 2016-10-21 MED ORDER — ONDANSETRON HCL 4 MG/2ML IJ SOLN
4.0000 mg | Freq: Once | INTRAMUSCULAR | Status: AC
  Administered 2016-10-21: 4 mg via INTRAVENOUS
  Filled 2016-10-21: qty 2

## 2016-10-21 MED ORDER — ONDANSETRON 4 MG PO TBDP: 4.0000 mg | ORAL_TABLET | Freq: Three times a day (TID) | ORAL | 1 refills | Status: DC | PRN

## 2016-10-21 NOTE — ED Notes (Signed)
Pt back from x-ray.

## 2016-10-21 NOTE — ED Notes (Signed)
Pt is in distress and feeling awful and nauseous. Went to bathroom with pt and states she has "got to get the poop out of her butt." States she is in a lot of abdominal pain and wants to get disimpacted.

## 2016-10-21 NOTE — ED Notes (Signed)
Pt advised not to walk but she insisted on walking to bathroom. This RN excorted her to bathroom

## 2016-10-21 NOTE — ED Provider Notes (Signed)
[place] DEPT Provider Note   CSN: [id number] Arrival date & time: 10/21/16  1126     History   Chief Complaint Chief Complaint  Patient presents with  . Nausea    HPI WAYNETTE TOWERS is a 76 y.o. female.  Level V caveat for "memory difficulties".  Patient presents with a conservative nausea for several days with cramping in her hands and feet, dizziness, constipation issues.  She was in the ED on 12/13/16 for concerns about nausea and vomiting.  No fever, sweats, chills, dysuria, chest pain, dyspnea.       Past Medical History:  Diagnosis Date  . Arthritis   . Bell's palsy   . Borderline diabetes   . Cancer (HCC)    breast  . Diverticulosis   . Hypercholesterolemia   . Memory difficulties   . Unsteady gait   . Vertigo     Patient Active Problem List   Diagnosis Date Noted  . Adenomatous polyp 01/02/2014    Past Surgical History:  Procedure Laterality Date  . ABDOMINAL HYSTERECTOMY    . BREAST SURGERY    . COLONOSCOPY  10/29/2008   VZC:HYIFOY examination with some left-sided diverticula/Diminutive cecal polyp, status post cold biopsy removal.  Right colonic mucosa appeared normal. adenomatous  . COLONOSCOPY N/A 01/17/2014   Procedure: COLONOSCOPY;  Surgeon: Daneil Dolin, MD;  Location: [facility];  Service: Endoscopy;  Laterality: N/A;  1115  . MASTECTOMY Left    abnormal mass in breast, non-cancerous per patient.   Marland Kitchen MASTECTOMY      OB History    Gravida Para Term Preterm AB Living   5 5 3 2   3    SAB TAB Ectopic Multiple Live Births                   Home Medications    Prior to Admission medications   Medication Sig Start Date End Date Taking? Authorizing Provider  ALPRAZolam (XANAX) 0.25 MG tablet Take 0.25-0.5 mg by mouth at bedtime.   Yes [provider]  bisacodyl (DULCOLAX) 5 MG EC tablet Take 10 mg by mouth daily as needed for moderate constipation.   Yes [provider]  bisacodyl (WOMENS GENTLE LAXATIVE) 5  MG EC tablet Take 10 mg by mouth daily as needed for moderate constipation.   Yes [provider]  donepezil (ARICEPT) 5 MG tablet Take 5 mg by mouth at bedtime.   Yes [provider]  gabapentin (NEURONTIN) 100 MG capsule Take 100 mg by mouth daily.   Yes [provider]  lansoprazole (PREVACID) 15 MG capsule Take 15 mg by mouth every morning.   Yes [provider]  meclizine (ANTIVERT) 25 MG tablet Take 1 tablet (25 mg total) by mouth every 6 (six) hours as needed for dizziness or nausea. Patient taking differently: Take 25 mg by mouth 3 (three) times daily.  02/03/14  Yes Riki Altes, MD  potassium chloride SA (K-DUR,KLOR-CON) 20 MEQ tablet Take 20 mEq by mouth daily.   Yes [provider]  pramipexole (MIRAPEX) 0.125 MG tablet Take 0.125 mg by mouth at bedtime.   Yes [provider]  rosuvastatin (CRESTOR) 20 MG tablet Take 20 mg by mouth daily.   Yes [provider]  Salicylic Acid (FREEZONE EX) Apply 1 application topically as needed.   Yes [provider]  Aspirin-Acetaminophen-Caffeine (GOODY HEADACHE PO) Take 1 Package by mouth daily as needed (headache).    [provider]  ondansetron [facility]  ODT) 4 MG disintegrating tablet Take 1 tablet (4 mg total) by mouth every 8 (eight) hours as needed for nausea or vomiting. 10/21/16   Nat Christen, MD    Family History Family History  Problem Relation Age of Onset  . Colon cancer Neg Hx     Social History Social History  Substance Use Topics  . Smoking status: Never Smoker  . Smokeless tobacco: Never Used  . Alcohol use No     Allergies   Patient has no known allergies.   Review of Systems Review of Systems  Reason unable to perform ROS: mild dementia.     Physical Exam Updated Vital Signs BP (!) 141/76 (BP Location: Right Arm)   Pulse 65   Temp (!) 97.4 F (36.3 C) (Oral)   Resp 14   Ht 5' 1.5" (1.562 m)   Wt 72.6 kg (160 lb)   SpO2 94%    BMI 29.74 kg/m   Physical Exam  Constitutional: She is oriented to person, place, and time. She appears well-developed and well-nourished.  Does not appear toxic  HENT:  Head: Normocephalic and atraumatic.  Eyes: Conjunctivae are normal.  Neck: Neck supple.  Cardiovascular: Normal rate and regular rhythm.   Pulmonary/Chest: Effort normal and breath sounds normal.  Abdominal: Soft. Bowel sounds are normal.  Genitourinary:  Genitourinary Comments: Rectal exam reveals excessive amount of stool in rectal vault  Musculoskeletal:  Hands are cramping  Neurological: She is alert and oriented to person, place, and time.  Skin: Skin is warm and dry.  Psychiatric: She has a normal mood and affect. Her behavior is normal.  Nursing note and vitals reviewed.    ED Treatments / Results  Labs (all labs ordered are listed, but only abnormal results are displayed) Labs Reviewed  CBC WITH DIFFERENTIAL/PLATELET - Abnormal; Notable for the following:       Result Value   WBC 16.0 (*)    RBC 5.38 (*)    Hemoglobin 15.9 (*)    HCT 47.6 (*)    Neutro Abs 13.8 (*)    Monocytes Absolute 1.3 (*)    All other components within normal limits  COMPREHENSIVE METABOLIC PANEL - Abnormal; Notable for the following:    Glucose, Bld 109 (*)    GFR calc non Af Amer 55 (*)    All other components within normal limits  LIPASE, BLOOD  MAGNESIUM    EKG  EKG Interpretation  Date/Time:  Wednesday October 21 2016 11:45:47 EDT Ventricular Rate:  65 PR Interval:    QRS Duration: 87 QT Interval:  425 QTC Calculation: 442 R Axis:   35 Text Interpretation:  Sinus rhythm Low voltage, precordial leads Borderline T abnormalities, anterior leads Confirmed by Nat Christen [phone]) on 10/21/2016 12:30:05 PM       Radiology Dg Abdomen 1 View  Result Date: 10/21/2016 CLINICAL DATA:  Constipation and nausea EXAM: ABDOMEN - 1 VIEW COMPARISON:  October 12, 2016 FINDINGS: There is moderate stool throughout the colon.  There is no bowel dilatation or air-fluid level to suggest bowel obstruction. No free air. There are phleboliths in the pelvis. There are foci of iliac artery calcification bilaterally. Bones are osteoporotic. IMPRESSION: Moderate stool throughout the colon. No bowel obstruction or free air evident. Iliac artery atherosclerosis. Electronically Signed   By: Lowella Grip III M.D.   On: 10/21/2016 13:22    Procedures Fecal disimpaction Date/Time: 10/21/2016 3:45 PM Performed by: Nat Christen Authorized by: Nat Christen  Consent: Verbal consent obtained.  Risks and benefits: risks, benefits and alternatives were discussed Consent given by: patient and guardian Patient understanding: patient states understanding of the procedure being performed Imaging studies: imaging studies available Patient identity confirmed: verbally with patient Time out: Immediately prior to procedure a "time out" was called to verify the correct patient, procedure, equipment, support staff and site/side marked as required. Local anesthesia used: no  Anesthesia: Local anesthesia used: no  Sedation: Patient sedated: no Comments: Digital disimpaction performed by examiner with significant amount of brown stool extracted. Fleets enema administered after procedure.    (including critical care time)  Medications Ordered in ED Medications  sodium chloride 0.9 % bolus 1,000 mL (0 mLs Intravenous Stopped 10/21/16 1644)  ondansetron (ZOFRAN) injection 4 mg (4 mg Intravenous Given 10/21/16 1308)     Initial Impression / Assessment and Plan / ED Course  I have reviewed the triage vital signs and the nursing notes.  Pertinent labs & imaging results that were available during my care of the patient were reviewed by me and considered in my medical decision making (see chart for details).     Laboratory studies showed no metabolic abnormality.  I was able to extract a large amount of stool via a digital disimpaction.   Patient feels better after IV fluids. Discharge medications Zofran 4 mg ODT  Final Clinical Impressions(s) / ED Diagnoses   Final diagnoses:  Cramping of hands  Constipation, unspecified constipation type  Nausea    New Prescriptions New Prescriptions   ONDANSETRON (ZOFRAN ODT) 4 MG DISINTEGRATING TABLET    Take 1 tablet (4 mg total) by mouth every 8 (eight) hours as needed for nausea or vomiting.     Nat Christen, MD 10/21/16 1728

## 2016-10-21 NOTE — Discharge Instructions (Signed)
You were dead flank constipated. We removed a large amount of stool today. Recommend fruit, fluid, fiber.   Over-the-counter products which I like are milk of magnesia, magnesium citrate, Miralax.  Prescription for nausea medication

## 2016-10-21 NOTE — ED Notes (Signed)
Advised MD of distress on pt.

## 2016-10-21 NOTE — ED Notes (Signed)
Pt just had a normal BM. States she feels better and pt is acting more active and feeling better. Pt is a little unsteady on her feet, but she contributes this to vertigo.

## 2016-10-21 NOTE — ED Notes (Signed)
Gave EKG to Dr. McManus  

## 2016-10-21 NOTE — ED Triage Notes (Signed)
Nausea,  Dizziness,  Constipation.  Stomach and leg cramping .  Seen with same last Monday.

## 2016-10-21 NOTE — ED Notes (Signed)
Lab has stuck patient twice and was unsuccessful drawing blood. This RN stuck patient again to start an IV. IV was successful, but not able to get blood return. Will alert MD

## 2016-10-26 DIAGNOSIS — I10 Essential (primary) hypertension: Secondary | ICD-10-CM | POA: Diagnosis not present

## 2016-10-26 DIAGNOSIS — G309 Alzheimer's disease, unspecified: Secondary | ICD-10-CM | POA: Diagnosis not present

## 2016-10-26 DIAGNOSIS — K5900 Constipation, unspecified: Secondary | ICD-10-CM | POA: Diagnosis not present

## 2016-10-26 DIAGNOSIS — H8309 Labyrinthitis, unspecified ear: Secondary | ICD-10-CM | POA: Diagnosis not present

## 2016-11-18 DIAGNOSIS — H8309 Labyrinthitis, unspecified ear: Secondary | ICD-10-CM | POA: Diagnosis not present

## 2016-11-18 DIAGNOSIS — I10 Essential (primary) hypertension: Secondary | ICD-10-CM | POA: Diagnosis not present

## 2016-11-18 DIAGNOSIS — F0150 Vascular dementia without behavioral disturbance: Secondary | ICD-10-CM | POA: Diagnosis not present

## 2016-11-18 DIAGNOSIS — G40209 Localization-related (focal) (partial) symptomatic epilepsy and epileptic syndromes with complex partial seizures, not intractable, without status epilepticus: Secondary | ICD-10-CM | POA: Diagnosis not present

## 2016-11-18 DIAGNOSIS — R739 Hyperglycemia, unspecified: Secondary | ICD-10-CM | POA: Diagnosis not present

## 2016-12-10 DIAGNOSIS — R51 Headache: Secondary | ICD-10-CM | POA: Diagnosis not present

## 2016-12-21 DIAGNOSIS — F0150 Vascular dementia without behavioral disturbance: Secondary | ICD-10-CM | POA: Diagnosis not present

## 2016-12-21 DIAGNOSIS — H8309 Labyrinthitis, unspecified ear: Secondary | ICD-10-CM | POA: Diagnosis not present

## 2016-12-21 DIAGNOSIS — G40209 Localization-related (focal) (partial) symptomatic epilepsy and epileptic syndromes with complex partial seizures, not intractable, without status epilepticus: Secondary | ICD-10-CM | POA: Diagnosis not present

## 2016-12-21 DIAGNOSIS — I10 Essential (primary) hypertension: Secondary | ICD-10-CM | POA: Diagnosis not present

## 2016-12-21 DIAGNOSIS — Z23 Encounter for immunization: Secondary | ICD-10-CM | POA: Diagnosis not present

## 2016-12-21 DIAGNOSIS — R739 Hyperglycemia, unspecified: Secondary | ICD-10-CM | POA: Diagnosis not present

## 2017-02-02 DIAGNOSIS — G40209 Localization-related (focal) (partial) symptomatic epilepsy and epileptic syndromes with complex partial seizures, not intractable, without status epilepticus: Secondary | ICD-10-CM | POA: Diagnosis not present

## 2017-02-02 DIAGNOSIS — F0150 Vascular dementia without behavioral disturbance: Secondary | ICD-10-CM | POA: Diagnosis not present

## 2017-04-13 DIAGNOSIS — Z6827 Body mass index (BMI) 27.0-27.9, adult: Secondary | ICD-10-CM | POA: Diagnosis not present

## 2017-04-13 DIAGNOSIS — G44209 Tension-type headache, unspecified, not intractable: Secondary | ICD-10-CM | POA: Diagnosis not present

## 2017-04-13 DIAGNOSIS — G40209 Localization-related (focal) (partial) symptomatic epilepsy and epileptic syndromes with complex partial seizures, not intractable, without status epilepticus: Secondary | ICD-10-CM | POA: Diagnosis not present

## 2017-04-13 DIAGNOSIS — I10 Essential (primary) hypertension: Secondary | ICD-10-CM | POA: Diagnosis not present

## 2017-04-23 ENCOUNTER — Emergency Department (HOSPITAL_COMMUNITY)
Admission: EM | Admit: 2017-04-23 | Discharge: 2017-04-23 | Disposition: A | Discharge status: Home / Self Care | Payer: Medicare HMO | Attending: Emergency Medicine | Admitting: Emergency Medicine

## 2017-04-23 ENCOUNTER — Encounter (HOSPITAL_COMMUNITY): Payer: Self-pay | Admitting: Emergency Medicine

## 2017-04-23 ENCOUNTER — Emergency Department (HOSPITAL_COMMUNITY): Payer: Medicare HMO

## 2017-04-23 DIAGNOSIS — Z9012 Acquired absence of left breast and nipple: Secondary | ICD-10-CM | POA: Insufficient documentation

## 2017-04-23 DIAGNOSIS — F0390 Unspecified dementia without behavioral disturbance: Secondary | ICD-10-CM | POA: Insufficient documentation

## 2017-04-23 DIAGNOSIS — R531 Weakness: Secondary | ICD-10-CM | POA: Diagnosis not present

## 2017-04-23 DIAGNOSIS — Z853 Personal history of malignant neoplasm of breast: Secondary | ICD-10-CM | POA: Diagnosis not present

## 2017-04-23 DIAGNOSIS — Z79899 Other long term (current) drug therapy: Secondary | ICD-10-CM | POA: Diagnosis not present

## 2017-04-23 DIAGNOSIS — F0281 Dementia in other diseases classified elsewhere with behavioral disturbance: Secondary | ICD-10-CM | POA: Diagnosis not present

## 2017-04-23 DIAGNOSIS — F068 Other specified mental disorders due to known physiological condition: Secondary | ICD-10-CM | POA: Diagnosis not present

## 2017-04-23 DIAGNOSIS — R4182 Altered mental status, unspecified: Secondary | ICD-10-CM | POA: Diagnosis present

## 2017-04-23 DIAGNOSIS — F29 Unspecified psychosis not due to a substance or known physiological condition: Secondary | ICD-10-CM | POA: Diagnosis not present

## 2017-04-23 DIAGNOSIS — R410 Disorientation, unspecified: Secondary | ICD-10-CM | POA: Insufficient documentation

## 2017-04-23 DIAGNOSIS — I6789 Other cerebrovascular disease: Secondary | ICD-10-CM | POA: Diagnosis not present

## 2017-04-23 LAB — URINALYSIS, ROUTINE W REFLEX MICROSCOPIC
Bilirubin Urine: NEGATIVE
GLUCOSE, UA: NEGATIVE mg/dL
HGB URINE DIPSTICK: NEGATIVE
Ketones, ur: NEGATIVE mg/dL
Leukocytes, UA: NEGATIVE
Nitrite: NEGATIVE
Protein, ur: NEGATIVE mg/dL
SPECIFIC GRAVITY, URINE: 1.005 (ref 1.005–1.030)
pH: 7 (ref 5.0–8.0)

## 2017-04-23 LAB — COMPREHENSIVE METABOLIC PANEL
ALBUMIN: 4.3 g/dL (ref 3.5–5.0)
ALK PHOS: 52 U/L (ref 38–126)
ALT: 36 U/L (ref 14–54)
ANION GAP: 11 (ref 5–15)
AST: 36 U/L (ref 15–41)
BUN: 7 mg/dL (ref 6–20)
CALCIUM: 9.1 mg/dL (ref 8.9–10.3)
CO2: 27 mmol/L (ref 22–32)
Chloride: 104 mmol/L (ref 101–111)
Creatinine, Ser: 0.78 mg/dL (ref 0.44–1.00)
GFR calc Af Amer: >60 mL/min (ref >60)
GFR calc non Af Amer: >60 mL/min (ref >60)
GLUCOSE: 99 mg/dL (ref 65–99)
Potassium: 3.6 mmol/L (ref 3.5–5.1)
Sodium: 142 mmol/L (ref 135–145)
Total Bilirubin: 0.5 mg/dL (ref 0.3–1.2)
Total Protein: 7.6 g/dL (ref 6.5–8.1)

## 2017-04-23 LAB — DIFFERENTIAL
Basophils Absolute: 0 10*3/uL (ref 0.0–0.1)
Basophils Relative: 0 %
EOS PCT: 1 %
Eosinophils Absolute: 0.1 10*3/uL (ref 0.0–0.7)
LYMPHS ABS: 2.1 10*3/uL (ref 0.7–4.0)
LYMPHS PCT: 23 %
MONO ABS: 0.6 10*3/uL (ref 0.1–1.0)
Monocytes Relative: 7 %
Neutro Abs: 6.4 10*3/uL (ref 1.7–7.7)
Neutrophils Relative %: 69 %

## 2017-04-23 LAB — CBC
HEMATOCRIT: 44.9 % (ref 36.0–46.0)
HEMOGLOBIN: 15.1 g/dL — AB (ref 12.0–15.0)
MCH: 30.1 pg (ref 26.0–34.0)
MCHC: 33.6 g/dL (ref 30.0–36.0)
MCV: 89.4 fL (ref 78.0–100.0)
Platelets: 199 10*3/uL (ref 150–400)
RBC: 5.02 MIL/uL (ref 3.87–5.11)
RDW: 13.7 % (ref 11.5–15.5)
WBC: 9.3 10*3/uL (ref 4.0–10.5)

## 2017-04-23 LAB — CBG MONITORING, ED: GLUCOSE-CAPILLARY: 107 mg/dL — AB (ref 65–99)

## 2017-04-23 MED ORDER — LORAZEPAM 0.5 MG PO TABS: ORAL_TABLET | ORAL | 1 refills | Status: DC

## 2017-04-23 MED ORDER — SODIUM CHLORIDE 0.9 % IV BOLUS (SEPSIS)
1000.0000 mL | Freq: Once | INTRAVENOUS | Status: AC
  Administered 2017-04-23: 1000 mL via INTRAVENOUS

## 2017-04-23 NOTE — ED Notes (Signed)
Pt alert & oriented x4, stable gait. Patient given discharge instructions, paperwork & prescription(s). Patient verbalized understanding. Pt left department in wheelchair escorted by staff. Pt left w/ no further questions. 

## 2017-04-23 NOTE — ED Triage Notes (Signed)
Per EMS patient from home: EMS states that they were called by family stating that pt was having a psychotic episode.  EMS states patient has history of dementia and has been calm alert and oriented x 3.

## 2017-04-23 NOTE — ED Provider Notes (Signed)
[facility] EMERGENCY DEPARTMENT Provider Note   CSN: [id number] Arrival date & time: 04/23/17  1431     History   Chief Complaint Chief Complaint  Patient presents with  . Altered Mental Status    HPI Tonya Cortez is a 77 y.o. female.  Patient was brought to the emergency department because he has had episodes of agitation.  She has dementia but is alert   The history is provided by a relative. No language interpreter was used.  Altered Mental Status   This is a new problem. The current episode started more than 2 days ago. The problem has been resolved. Associated symptoms include confusion. Pertinent negatives include no seizures and no hallucinations. Risk factors: Dementia. Her past medical history does not include seizures.    Past Medical History:  Diagnosis Date  . Arthritis   . Bell's palsy   . Borderline diabetes   . Cancer (HCC)    breast  . Diverticulosis   . Hypercholesterolemia   . Memory difficulties   . Unsteady gait   . Vertigo     Patient Active Problem List   Diagnosis Date Noted  . Adenomatous polyp 01/02/2014    Past Surgical History:  Procedure Laterality Date  . ABDOMINAL HYSTERECTOMY    . BREAST SURGERY    . COLONOSCOPY  10/29/2008   [id number] examination with some left-sided diverticula/Diminutive cecal polyp, status post cold biopsy removal.  Right colonic mucosa appeared normal. adenomatous  . COLONOSCOPY N/A 01/17/2014   Procedure: COLONOSCOPY;  Surgeon: Daneil Dolin, MD;  Location: [facility];  Service: Endoscopy;  Laterality: N/A;  1115  . MASTECTOMY Left    abnormal mass in breast, non-cancerous per patient.   Marland Kitchen MASTECTOMY      OB History    Gravida Para Term Preterm AB Living   5 5 3 2   3    SAB TAB Ectopic Multiple Live Births                   Home Medications    Prior to Admission medications   Medication Sig Start Date End Date Taking? Authorizing Provider  ALPRAZolam (XANAX) 0.25 MG tablet Take  0.25-0.5 mg by mouth at bedtime.   Yes [provider]  Aspirin-Acetaminophen-Caffeine (GOODY HEADACHE PO) Take 1 Package by mouth daily as needed (headache).   Yes [provider]  bisacodyl (DULCOLAX) 5 MG EC tablet Take 10 mg by mouth daily as needed for moderate constipation.   Yes [provider]  bisacodyl (WOMENS GENTLE LAXATIVE) 5 MG EC tablet Take 10 mg by mouth daily as needed for moderate constipation.   Yes [provider]  donepezil (ARICEPT) 5 MG tablet Take 5 mg by mouth at bedtime.   Yes [provider]  Ginger, Zingiber officinalis, (GINGER ROOT) 550 MG CAPS Take 1 capsule by mouth.   Yes [provider]  Ginkgo Biloba 120 MG CAPS Take 1 capsule by mouth daily.   Yes [provider]  lansoprazole (PREVACID) 15 MG capsule Take 15 mg by mouth every morning.   Yes [provider]  meclizine (ANTIVERT) 25 MG tablet Take 1 tablet (25 mg total) by mouth every 6 (six) hours as needed for dizziness or nausea. Patient taking differently: Take 25 mg by mouth 3 (three) times daily.  02/03/14  Yes Riki Altes, MD  Multiple Vitamins-Minerals (WOMENS MULTIVITAMIN PO) Take 1 capsule by mouth daily.   Yes [provider]  Omega-3 1000 MG  CAPS Take 1 capsule by mouth.   Yes [provider]  ondansetron (ZOFRAN ODT) 4 MG disintegrating tablet Take 1 tablet (4 mg total) by mouth every 8 (eight) hours as needed for nausea or vomiting. 10/21/16  Yes Nat Christen, MD  potassium chloride SA (K-DUR,KLOR-CON) 20 MEQ tablet Take 20 mEq by mouth daily.   Yes [provider]  pramipexole (MIRAPEX) 0.125 MG tablet Take 0.125 mg by mouth at bedtime.   Yes [provider]  pyridOXINE (VITAMIN B-6) 100 MG tablet Take 100 mg by mouth daily.   Yes [provider]  rosuvastatin (CRESTOR) 20 MG tablet Take 20 mg by mouth daily.   Yes [provider]  Salicylic Acid (FREEZONE EX) Apply 1  application topically as needed.   Yes [provider]  vitamin B-12 (CYANOCOBALAMIN) 1000 MCG tablet Take 1,000 mcg by mouth daily.   Yes [provider]  gabapentin (NEURONTIN) 100 MG capsule Take 100 mg by mouth daily.    [provider]  LORazepam (ATIVAN) 0.5 MG tablet Take 1 every 6-8 hours if needed for agitation 04/23/17   Milton Ferguson, MD    Family History Family History  Problem Relation Age of Onset  . Colon cancer Neg Hx     Social History Social History   Tobacco Use  . Smoking status: Never Smoker  . Smokeless tobacco: Never Used  Substance Use Topics  . Alcohol use: No  . Drug use: No     Allergies   Patient has no known allergies.   Review of Systems Review of Systems  Constitutional: Negative for appetite change and fatigue.  HENT: Negative for congestion, ear discharge and sinus pressure.   Eyes: Negative for discharge.  Respiratory: Negative for cough.   Cardiovascular: Negative for chest pain.  Gastrointestinal: Negative for abdominal pain and diarrhea.  Genitourinary: Negative for frequency and hematuria.  Musculoskeletal: Negative for back pain.  Skin: Negative for rash.  Neurological: Negative for seizures and headaches.  Psychiatric/Behavioral: Positive for confusion. Negative for hallucinations.     Physical Exam Updated Vital Signs BP 136/83 (BP Location: Right Arm)   Pulse 77   Temp 98.5 F (36.9 C) (Oral)   Resp 17   Ht 5\' 1"  (1.549 m)   Wt 72.6 kg (160 lb)   SpO2 99%   BMI 30.23 kg/m   Physical Exam  Constitutional: She appears well-developed.  HENT:  Head: Normocephalic.  Eyes: Conjunctivae and EOM are normal. No scleral icterus.  Neck: Neck supple. No thyromegaly present.  Cardiovascular: Normal rate and regular rhythm. Exam reveals no gallop and no friction rub.  No murmur heard. Pulmonary/Chest: No stridor. She has no wheezes. She has no rales. She exhibits no tenderness.  Abdominal: She  exhibits no distension. There is no tenderness. There is no rebound.  Musculoskeletal: Normal range of motion. She exhibits no edema.  Lymphadenopathy:    She has no cervical adenopathy.  Neurological: She is alert. She exhibits normal muscle tone. Coordination normal.  Patient is oriented to place and person but not situation  Skin: No rash noted. No erythema.     ED Treatments / Results  Labs (all labs ordered are listed, but only abnormal results are displayed) Labs Reviewed  CBC - Abnormal; Notable for the following components:      Result Value   Hemoglobin 15.1 (*)    All other components within normal limits  URINALYSIS, ROUTINE W REFLEX MICROSCOPIC - Abnormal; Notable for the following components:  Color, Urine STRAW (*)    All other components within normal limits  CBG MONITORING, ED - Abnormal; Notable for the following components:   Glucose-Capillary 107 (*)    All other components within normal limits  COMPREHENSIVE METABOLIC PANEL  DIFFERENTIAL    EKG  EKG Interpretation None       Radiology Dg Chest 2 View  Result Date: 04/23/2017 CLINICAL DATA:  Altered mental status.  Weakness. EXAM: CHEST  2 VIEW COMPARISON:  10/12/2016 and 11/17/2008 FINDINGS: The heart size and mediastinal contours are within normal limits. Both lungs are clear. The visualized skeletal structures are unremarkable. Aortic atherosclerosis. Left mastectomy. No significant bone abnormality. IMPRESSION: No active cardiopulmonary disease. Aortic Atherosclerosis (ICD10-I70.0). Electronically Signed   By: Lorriane Shire M.D.   On: 04/23/2017 16:43   Ct Head Wo Contrast  Result Date: 04/23/2017 CLINICAL DATA:  Psychotic episode.  History of dementia. EXAM: CT HEAD WITHOUT CONTRAST TECHNIQUE: Contiguous axial images were obtained from the base of the skull through the vertex without intravenous contrast. COMPARISON:  CT head 11/10/2013.  MRI brain 04/12/2015. FINDINGS: Brain: There is no evidence  of acute intracranial hemorrhage, mass lesion, brain edema or extra-axial fluid collection. Moderate generalized atrophy with prominence of the ventricles and subarachnoid spaces. Stable minimal chronic periventricular white matter disease. There is no CT evidence of acute cortical infarction. Vascular: Intracranial vascular calcifications. No hyperdense vessel identified. Skull: Negative for fracture or focal lesion. Sinuses/Orbits: The visualized paranasal sinuses and mastoid air cells are clear. No orbital abnormalities are seen. Other: None. IMPRESSION: 1. Stable head CT demonstrating no acute findings. 2. Stable atrophy and mild chronic periventricular white matter disease. Electronically Signed   By: Richardean Sale M.D.   On: 04/23/2017 16:47    Procedures Procedures (including critical care time)  Medications Ordered in ED Medications  sodium chloride 0.9 % bolus 1,000 mL (1,000 mLs Intravenous New Bag/Given 04/23/17 1608)     Initial Impression / Assessment and Plan / ED Course  I have reviewed the triage vital signs and the nursing notes.  Pertinent labs & imaging results that were available during my care of the patient were reviewed by me and considered in my medical decision making (see chart for details).     Labs and CT exam unremarkable.  Patient with dementia and episodes of agitation.  She will be placed on half milligram Ativan as needed and will follow up with her PCP  Final Clinical Impressions(s) / ED Diagnoses   Final diagnoses:  Confusion    ED Discharge Orders        Ordered    LORazepam (ATIVAN) 0.5 MG tablet     04/23/17 1853       Milton Ferguson, MD 04/23/17 1856

## 2017-04-23 NOTE — Discharge Instructions (Signed)
Follow-up with your family doctor next week for recheck. 

## 2017-04-28 ENCOUNTER — Other Ambulatory Visit: Payer: Self-pay

## 2017-04-28 ENCOUNTER — Emergency Department (HOSPITAL_COMMUNITY)
Admission: EM | Admit: 2017-04-28 | Discharge: 2017-04-28 | Disposition: A | Discharge status: Home / Self Care | Payer: Commercial Managed Care - HMO | Attending: Emergency Medicine | Admitting: Emergency Medicine

## 2017-04-28 ENCOUNTER — Encounter (HOSPITAL_COMMUNITY): Payer: Self-pay | Admitting: Emergency Medicine

## 2017-04-28 DIAGNOSIS — Z853 Personal history of malignant neoplasm of breast: Secondary | ICD-10-CM | POA: Insufficient documentation

## 2017-04-28 DIAGNOSIS — R61 Generalized hyperhidrosis: Secondary | ICD-10-CM | POA: Diagnosis not present

## 2017-04-28 DIAGNOSIS — Z79899 Other long term (current) drug therapy: Secondary | ICD-10-CM | POA: Insufficient documentation

## 2017-04-28 DIAGNOSIS — R002 Palpitations: Secondary | ICD-10-CM | POA: Diagnosis not present

## 2017-04-28 DIAGNOSIS — R110 Nausea: Secondary | ICD-10-CM | POA: Diagnosis not present

## 2017-04-28 LAB — URINALYSIS, ROUTINE W REFLEX MICROSCOPIC
Bilirubin Urine: NEGATIVE
Glucose, UA: NEGATIVE mg/dL
Hgb urine dipstick: NEGATIVE
Ketones, ur: 5 mg/dL — AB
Leukocytes, UA: NEGATIVE
Nitrite: NEGATIVE
Protein, ur: NEGATIVE mg/dL
Specific Gravity, Urine: 1.013 (ref 1.005–1.030)
pH: 8 (ref 5.0–8.0)

## 2017-04-28 LAB — CBC
HCT: 46 % (ref 36.0–46.0)
Hemoglobin: 15.2 g/dL — ABNORMAL HIGH (ref 12.0–15.0)
MCH: 29.6 pg (ref 26.0–34.0)
MCHC: 33 g/dL (ref 30.0–36.0)
MCV: 89.7 fL (ref 78.0–100.0)
Platelets: 192 10*3/uL (ref 150–400)
RBC: 5.13 MIL/uL — ABNORMAL HIGH (ref 3.87–5.11)
RDW: 13.7 % (ref 11.5–15.5)
WBC: 9.9 10*3/uL (ref 4.0–10.5)

## 2017-04-28 LAB — BASIC METABOLIC PANEL WITH GFR
Anion gap: 12 (ref 5–15)
BUN: 8 mg/dL (ref 6–20)
CO2: 28 mmol/L (ref 22–32)
Calcium: 9.1 mg/dL (ref 8.9–10.3)
Chloride: 101 mmol/L (ref 101–111)
Creatinine, Ser: 0.78 mg/dL (ref 0.44–1.00)
GFR calc Af Amer: >60 mL/min (ref >60)
GFR calc non Af Amer: >60 mL/min (ref >60)
Glucose, Bld: 136 mg/dL — ABNORMAL HIGH (ref 65–99)
Potassium: 3.6 mmol/L (ref 3.5–5.1)
Sodium: 141 mmol/L (ref 135–145)

## 2017-04-28 MED ORDER — ONDANSETRON 4 MG PO TBDP
4.0000 mg | ORAL_TABLET | Freq: Once | ORAL | Status: AC
  Administered 2017-04-28: 4 mg via ORAL
  Filled 2017-04-28: qty 1

## 2017-04-28 MED ORDER — ONDANSETRON HCL 4 MG PO TABS: 4.0000 mg | ORAL_TABLET | Freq: Three times a day (TID) | ORAL | 0 refills | Status: DC | PRN

## 2017-04-28 NOTE — ED Notes (Signed)
Pt ambulatory without difficulty. 

## 2017-04-28 NOTE — ED Triage Notes (Signed)
Pt reports sudden onset palpitations this am with sweating and generalized weakness. Pt family reports pt has been seen for same in the past and reports "medicine is on back order." pt family unsure of medication type. Pt denies pain. Pt alert and at baseline mentation.

## 2017-04-29 NOTE — ED Provider Notes (Signed)
[facility] EMERGENCY DEPARTMENT Provider Note   CSN: [id number] Arrival date & time: 04/28/17  1125     History   Chief Complaint Chief Complaint  Patient presents with  . Palpitations    HPI Tonya Cortez is a 77 y.o. female.  HPI   77 year old female with a chief complaint of nausea.  Triage note was reviewed but she is not complaining of this specifically to me.  She does have a history of dementia though.  Family reports that the nurse told him that this morning she appeared to be weak and was sweating.  Apparently was complaining palpitations at that time.  Currently she is only complaining of feeling sick to her stomach.  Denies any pain.  No vomiting.  No fevers or chills.  No falls.  Past Medical History:  Diagnosis Date  . Arthritis   . Bell's palsy   . Borderline diabetes   . Cancer (HCC)    breast  . Diverticulosis   . Hypercholesterolemia   . Memory difficulties   . Unsteady gait   . Vertigo     Patient Active Problem List   Diagnosis Date Noted  . Adenomatous polyp 01/02/2014    Past Surgical History:  Procedure Laterality Date  . ABDOMINAL HYSTERECTOMY    . BREAST SURGERY    . COLONOSCOPY  10/29/2008   [id number] examination with some left-sided diverticula/Diminutive cecal polyp, status post cold biopsy removal.  Right colonic mucosa appeared normal. adenomatous  . COLONOSCOPY N/A 01/17/2014   Procedure: COLONOSCOPY;  Surgeon: Daneil Dolin, MD;  Location: [facility];  Service: Endoscopy;  Laterality: N/A;  1115  . MASTECTOMY Left    abnormal mass in breast, non-cancerous per patient.   Marland Kitchen MASTECTOMY      OB History    Gravida Para Term Preterm AB Living   5 5 3 2   3    SAB TAB Ectopic Multiple Live Births                   Home Medications    Prior to Admission medications   Medication Sig Start Date End Date Taking? Authorizing Provider  Aspirin-Acetaminophen-Caffeine (GOODY HEADACHE PO) Take 1 Package by mouth daily as  needed (headache).   Yes [provider]  donepezil (ARICEPT) 5 MG tablet Take 5 mg by mouth at bedtime.   Yes [provider]  Ginger, Zingiber officinalis, (GINGER ROOT) 550 MG CAPS Take 1 capsule by mouth.   Yes [provider]  Ginkgo Biloba 120 MG CAPS Take 1 capsule by mouth daily.   Yes [provider]  Multiple Vitamins-Minerals (WOMENS MULTIVITAMIN PO) Take 1 capsule by mouth daily.   Yes [provider]  Omega-3 1000 MG CAPS Take 1 capsule by mouth.   Yes [provider]  potassium chloride SA (K-DUR,KLOR-CON) 20 MEQ tablet Take 20 mEq by mouth daily.   Yes [provider]  pyridOXINE (VITAMIN B-6) 100 MG tablet Take 100 mg by mouth daily.   Yes [provider]  rosuvastatin (CRESTOR) 20 MG tablet Take 20 mg by mouth daily.   Yes [provider]  Salicylic Acid (FREEZONE EX) Apply 1 application topically as needed.   Yes [provider]  vitamin B-12 (CYANOCOBALAMIN) 1000 MCG tablet Take 1,000 mcg by mouth daily.   Yes [provider]  LORazepam (ATIVAN) 0.5 MG tablet Take 1 every 6-8 hours if needed for agitation Patient not taking: Reported on 04/28/2017 04/23/17  Milton Ferguson, MD  ondansetron (ZOFRAN) 4 MG tablet Take 1 tablet (4 mg total) by mouth every 8 (eight) hours as needed for nausea or vomiting. 04/28/17   Virgel Manifold, MD    Family History Family History  Problem Relation Age of Onset  . Colon cancer Neg Hx     Social History Social History   Tobacco Use  . Smoking status: Never Smoker  . Smokeless tobacco: Never Used  Substance Use Topics  . Alcohol use: No  . Drug use: No     Allergies   Patient has no known allergies.   Review of Systems Review of Systems  All systems reviewed and negative, other than as noted in HPI.  Physical Exam Updated Vital Signs BP 130/83 (BP Location: Right Arm)   Pulse 65   Temp 97.7 F (36.5 C) (Oral)   Resp 18   Ht  5\' 1"  (1.549 m)   Wt 72.6 kg (160 lb)   SpO2 98%   BMI 30.23 kg/m   Physical Exam  Constitutional: She appears well-developed and well-nourished. No distress.  HENT:  Head: Normocephalic and atraumatic.  Eyes: Conjunctivae are normal. Right eye exhibits no discharge. Left eye exhibits no discharge.  Neck: Neck supple.  Cardiovascular: Normal rate, regular rhythm and normal heart sounds. Exam reveals no gallop and no friction rub.  No murmur heard. Pulmonary/Chest: Effort normal and breath sounds normal. No respiratory distress.  Abdominal: Soft. She exhibits no distension. There is no tenderness.  Musculoskeletal: She exhibits no edema or tenderness.  Neurological: She is alert.  Skin: Skin is warm and dry.  Psychiatric: She has a normal mood and affect. Her behavior is normal. Thought content normal.  Nursing note and vitals reviewed.    ED Treatments / Results  Labs (all labs ordered are listed, but only abnormal results are displayed) Labs Reviewed  BASIC METABOLIC PANEL - Abnormal; Notable for the following components:      Result Value   Glucose, Bld 136 (*)    All other components within normal limits  CBC - Abnormal; Notable for the following components:   RBC 5.13 (*)    Hemoglobin 15.2 (*)    All other components within normal limits  URINALYSIS, ROUTINE W REFLEX MICROSCOPIC - Abnormal; Notable for the following components:   APPearance HAZY (*)    Ketones, ur 5 (*)    All other components within normal limits    EKG  EKG Interpretation  Date/Time:  Wednesday April 28 2017 11:39:38 EST Ventricular Rate:  59 PR Interval:  150 QRS Duration: 82 QT Interval:  436 QTC Calculation: 431 R Axis:   -22 Text Interpretation:  Sinus bradycardia low voltage precordial leads Confirmed by Virgel Manifold [id number]) on 04/28/2017 1:15:32 PM       Radiology No results found.  Procedures Procedures (including critical care time)  Medications Ordered in  ED Medications  ondansetron (ZOFRAN-ODT) disintegrating tablet 4 mg (4 mg Oral Given 04/28/17 1549)     Initial Impression / Assessment and Plan / ED Course  I have reviewed the triage vital signs and the nursing notes.  Pertinent labs & imaging results that were available during my care of the patient were reviewed by me and considered in my medical decision making (see chart for details).     77 year old female with nausea.  Apparently an episode of diaphoresis and palpitations earlier today.  Currently resolved. Currently in sinus rhythm. Ambulated w/o difficulty.   It has been determined that  no acute conditions requiring further emergency intervention are present at this time. The patient has been advised of the diagnosis and plan. I reviewed any labs and imaging including any potential incidental findings. We have discussed signs and symptoms that warrant return to the ED and they are listed in the discharge instructions.    Final Clinical Impressions(s) / ED Diagnoses   Final diagnoses:  Nausea    ED Discharge Orders        Ordered    ondansetron (ZOFRAN) 4 MG tablet  Every 8 hours PRN     04/28/17 1522       Virgel Manifold, MD 04/29/17 1731

## 2017-04-30 DIAGNOSIS — R110 Nausea: Secondary | ICD-10-CM | POA: Diagnosis not present

## 2017-04-30 DIAGNOSIS — F060 Psychotic disorder with hallucinations due to known physiological condition: Secondary | ICD-10-CM | POA: Diagnosis not present

## 2017-04-30 DIAGNOSIS — K219 Gastro-esophageal reflux disease without esophagitis: Secondary | ICD-10-CM | POA: Diagnosis not present

## 2017-04-30 DIAGNOSIS — G309 Alzheimer's disease, unspecified: Secondary | ICD-10-CM | POA: Diagnosis not present

## 2017-05-19 DIAGNOSIS — K5909 Other constipation: Secondary | ICD-10-CM | POA: Diagnosis not present

## 2017-05-19 DIAGNOSIS — F0150 Vascular dementia without behavioral disturbance: Secondary | ICD-10-CM | POA: Diagnosis not present

## 2017-05-19 DIAGNOSIS — K219 Gastro-esophageal reflux disease without esophagitis: Secondary | ICD-10-CM | POA: Diagnosis not present

## 2017-06-18 DIAGNOSIS — R51 Headache: Secondary | ICD-10-CM | POA: Diagnosis not present

## 2017-08-11 DIAGNOSIS — G309 Alzheimer's disease, unspecified: Secondary | ICD-10-CM | POA: Diagnosis not present

## 2017-08-11 DIAGNOSIS — B9681 Helicobacter pylori [H. pylori] as the cause of diseases classified elsewhere: Secondary | ICD-10-CM | POA: Diagnosis not present

## 2017-08-11 DIAGNOSIS — R110 Nausea: Secondary | ICD-10-CM | POA: Diagnosis not present

## 2017-08-11 DIAGNOSIS — E785 Hyperlipidemia, unspecified: Secondary | ICD-10-CM | POA: Diagnosis not present

## 2017-08-11 DIAGNOSIS — I10 Essential (primary) hypertension: Secondary | ICD-10-CM | POA: Diagnosis not present

## 2017-09-10 DIAGNOSIS — E785 Hyperlipidemia, unspecified: Secondary | ICD-10-CM | POA: Diagnosis not present

## 2017-09-10 DIAGNOSIS — I10 Essential (primary) hypertension: Secondary | ICD-10-CM | POA: Diagnosis not present

## 2017-09-10 DIAGNOSIS — G309 Alzheimer's disease, unspecified: Secondary | ICD-10-CM | POA: Diagnosis not present

## 2017-09-10 DIAGNOSIS — R110 Nausea: Secondary | ICD-10-CM | POA: Diagnosis not present

## 2017-09-20 DIAGNOSIS — Z9181 History of falling: Secondary | ICD-10-CM | POA: Diagnosis not present

## 2017-09-20 DIAGNOSIS — E785 Hyperlipidemia, unspecified: Secondary | ICD-10-CM | POA: Diagnosis not present

## 2017-09-20 DIAGNOSIS — M1990 Unspecified osteoarthritis, unspecified site: Secondary | ICD-10-CM | POA: Diagnosis not present

## 2017-09-20 DIAGNOSIS — Z853 Personal history of malignant neoplasm of breast: Secondary | ICD-10-CM | POA: Diagnosis not present

## 2017-09-20 DIAGNOSIS — R42 Dizziness and giddiness: Secondary | ICD-10-CM | POA: Diagnosis not present

## 2017-09-20 DIAGNOSIS — Z9012 Acquired absence of left breast and nipple: Secondary | ICD-10-CM | POA: Diagnosis not present

## 2017-09-20 DIAGNOSIS — R413 Other amnesia: Secondary | ICD-10-CM | POA: Diagnosis not present

## 2017-09-20 DIAGNOSIS — R110 Nausea: Secondary | ICD-10-CM | POA: Diagnosis not present

## 2017-09-20 DIAGNOSIS — F419 Anxiety disorder, unspecified: Secondary | ICD-10-CM | POA: Diagnosis not present

## 2017-09-23 DIAGNOSIS — F419 Anxiety disorder, unspecified: Secondary | ICD-10-CM | POA: Diagnosis not present

## 2017-09-23 DIAGNOSIS — M1990 Unspecified osteoarthritis, unspecified site: Secondary | ICD-10-CM | POA: Diagnosis not present

## 2017-09-23 DIAGNOSIS — R413 Other amnesia: Secondary | ICD-10-CM | POA: Diagnosis not present

## 2017-09-23 DIAGNOSIS — E785 Hyperlipidemia, unspecified: Secondary | ICD-10-CM | POA: Diagnosis not present

## 2017-09-23 DIAGNOSIS — Z9012 Acquired absence of left breast and nipple: Secondary | ICD-10-CM | POA: Diagnosis not present

## 2017-09-23 DIAGNOSIS — Z9181 History of falling: Secondary | ICD-10-CM | POA: Diagnosis not present

## 2017-09-23 DIAGNOSIS — Z853 Personal history of malignant neoplasm of breast: Secondary | ICD-10-CM | POA: Diagnosis not present

## 2017-09-23 DIAGNOSIS — R42 Dizziness and giddiness: Secondary | ICD-10-CM | POA: Diagnosis not present

## 2017-09-23 DIAGNOSIS — R110 Nausea: Secondary | ICD-10-CM | POA: Diagnosis not present

## 2017-09-27 DIAGNOSIS — R110 Nausea: Secondary | ICD-10-CM | POA: Diagnosis not present

## 2017-09-27 DIAGNOSIS — R42 Dizziness and giddiness: Secondary | ICD-10-CM | POA: Diagnosis not present

## 2017-09-27 DIAGNOSIS — Z853 Personal history of malignant neoplasm of breast: Secondary | ICD-10-CM | POA: Diagnosis not present

## 2017-09-27 DIAGNOSIS — Z9012 Acquired absence of left breast and nipple: Secondary | ICD-10-CM | POA: Diagnosis not present

## 2017-09-27 DIAGNOSIS — Z9181 History of falling: Secondary | ICD-10-CM | POA: Diagnosis not present

## 2017-09-27 DIAGNOSIS — F419 Anxiety disorder, unspecified: Secondary | ICD-10-CM | POA: Diagnosis not present

## 2017-09-27 DIAGNOSIS — E785 Hyperlipidemia, unspecified: Secondary | ICD-10-CM | POA: Diagnosis not present

## 2017-09-27 DIAGNOSIS — M1990 Unspecified osteoarthritis, unspecified site: Secondary | ICD-10-CM | POA: Diagnosis not present

## 2017-09-27 DIAGNOSIS — R413 Other amnesia: Secondary | ICD-10-CM | POA: Diagnosis not present

## 2017-09-29 DIAGNOSIS — R413 Other amnesia: Secondary | ICD-10-CM | POA: Diagnosis not present

## 2017-09-29 DIAGNOSIS — Z9181 History of falling: Secondary | ICD-10-CM | POA: Diagnosis not present

## 2017-09-29 DIAGNOSIS — R42 Dizziness and giddiness: Secondary | ICD-10-CM | POA: Diagnosis not present

## 2017-09-29 DIAGNOSIS — E785 Hyperlipidemia, unspecified: Secondary | ICD-10-CM | POA: Diagnosis not present

## 2017-09-29 DIAGNOSIS — M1990 Unspecified osteoarthritis, unspecified site: Secondary | ICD-10-CM | POA: Diagnosis not present

## 2017-09-29 DIAGNOSIS — Z9012 Acquired absence of left breast and nipple: Secondary | ICD-10-CM | POA: Diagnosis not present

## 2017-09-29 DIAGNOSIS — R110 Nausea: Secondary | ICD-10-CM | POA: Diagnosis not present

## 2017-09-29 DIAGNOSIS — Z853 Personal history of malignant neoplasm of breast: Secondary | ICD-10-CM | POA: Diagnosis not present

## 2017-09-29 DIAGNOSIS — F419 Anxiety disorder, unspecified: Secondary | ICD-10-CM | POA: Diagnosis not present

## 2017-10-04 DIAGNOSIS — R413 Other amnesia: Secondary | ICD-10-CM | POA: Diagnosis not present

## 2017-10-04 DIAGNOSIS — F419 Anxiety disorder, unspecified: Secondary | ICD-10-CM | POA: Diagnosis not present

## 2017-10-04 DIAGNOSIS — Z9012 Acquired absence of left breast and nipple: Secondary | ICD-10-CM | POA: Diagnosis not present

## 2017-10-04 DIAGNOSIS — R42 Dizziness and giddiness: Secondary | ICD-10-CM | POA: Diagnosis not present

## 2017-10-04 DIAGNOSIS — R110 Nausea: Secondary | ICD-10-CM | POA: Diagnosis not present

## 2017-10-04 DIAGNOSIS — M1990 Unspecified osteoarthritis, unspecified site: Secondary | ICD-10-CM | POA: Diagnosis not present

## 2017-10-04 DIAGNOSIS — Z9181 History of falling: Secondary | ICD-10-CM | POA: Diagnosis not present

## 2017-10-04 DIAGNOSIS — E785 Hyperlipidemia, unspecified: Secondary | ICD-10-CM | POA: Diagnosis not present

## 2017-10-04 DIAGNOSIS — Z853 Personal history of malignant neoplasm of breast: Secondary | ICD-10-CM | POA: Diagnosis not present

## 2017-10-06 DIAGNOSIS — Z9012 Acquired absence of left breast and nipple: Secondary | ICD-10-CM | POA: Diagnosis not present

## 2017-10-06 DIAGNOSIS — E785 Hyperlipidemia, unspecified: Secondary | ICD-10-CM | POA: Diagnosis not present

## 2017-10-06 DIAGNOSIS — R42 Dizziness and giddiness: Secondary | ICD-10-CM | POA: Diagnosis not present

## 2017-10-06 DIAGNOSIS — Z853 Personal history of malignant neoplasm of breast: Secondary | ICD-10-CM | POA: Diagnosis not present

## 2017-10-06 DIAGNOSIS — F419 Anxiety disorder, unspecified: Secondary | ICD-10-CM | POA: Diagnosis not present

## 2017-10-06 DIAGNOSIS — M1990 Unspecified osteoarthritis, unspecified site: Secondary | ICD-10-CM | POA: Diagnosis not present

## 2017-10-06 DIAGNOSIS — R413 Other amnesia: Secondary | ICD-10-CM | POA: Diagnosis not present

## 2017-10-06 DIAGNOSIS — Z9181 History of falling: Secondary | ICD-10-CM | POA: Diagnosis not present

## 2017-10-06 DIAGNOSIS — R110 Nausea: Secondary | ICD-10-CM | POA: Diagnosis not present

## 2017-10-11 DIAGNOSIS — G309 Alzheimer's disease, unspecified: Secondary | ICD-10-CM | POA: Diagnosis not present

## 2017-10-11 DIAGNOSIS — I10 Essential (primary) hypertension: Secondary | ICD-10-CM | POA: Diagnosis not present

## 2017-10-11 DIAGNOSIS — Z0000 Encounter for general adult medical examination without abnormal findings: Secondary | ICD-10-CM | POA: Diagnosis not present

## 2017-10-11 DIAGNOSIS — H8309 Labyrinthitis, unspecified ear: Secondary | ICD-10-CM | POA: Diagnosis not present

## 2017-10-11 DIAGNOSIS — Z6824 Body mass index (BMI) 24.0-24.9, adult: Secondary | ICD-10-CM | POA: Diagnosis not present

## 2017-10-12 DIAGNOSIS — Z9012 Acquired absence of left breast and nipple: Secondary | ICD-10-CM | POA: Diagnosis not present

## 2017-10-12 DIAGNOSIS — R110 Nausea: Secondary | ICD-10-CM | POA: Diagnosis not present

## 2017-10-12 DIAGNOSIS — F419 Anxiety disorder, unspecified: Secondary | ICD-10-CM | POA: Diagnosis not present

## 2017-10-12 DIAGNOSIS — Z853 Personal history of malignant neoplasm of breast: Secondary | ICD-10-CM | POA: Diagnosis not present

## 2017-10-12 DIAGNOSIS — Z9181 History of falling: Secondary | ICD-10-CM | POA: Diagnosis not present

## 2017-10-12 DIAGNOSIS — M1990 Unspecified osteoarthritis, unspecified site: Secondary | ICD-10-CM | POA: Diagnosis not present

## 2017-10-12 DIAGNOSIS — R413 Other amnesia: Secondary | ICD-10-CM | POA: Diagnosis not present

## 2017-10-12 DIAGNOSIS — E785 Hyperlipidemia, unspecified: Secondary | ICD-10-CM | POA: Diagnosis not present

## 2017-10-12 DIAGNOSIS — R42 Dizziness and giddiness: Secondary | ICD-10-CM | POA: Diagnosis not present

## 2017-10-14 DIAGNOSIS — Z9181 History of falling: Secondary | ICD-10-CM | POA: Diagnosis not present

## 2017-10-14 DIAGNOSIS — E785 Hyperlipidemia, unspecified: Secondary | ICD-10-CM | POA: Diagnosis not present

## 2017-10-14 DIAGNOSIS — Z853 Personal history of malignant neoplasm of breast: Secondary | ICD-10-CM | POA: Diagnosis not present

## 2017-10-14 DIAGNOSIS — M1990 Unspecified osteoarthritis, unspecified site: Secondary | ICD-10-CM | POA: Diagnosis not present

## 2017-10-14 DIAGNOSIS — R413 Other amnesia: Secondary | ICD-10-CM | POA: Diagnosis not present

## 2017-10-14 DIAGNOSIS — Z9012 Acquired absence of left breast and nipple: Secondary | ICD-10-CM | POA: Diagnosis not present

## 2017-10-14 DIAGNOSIS — R110 Nausea: Secondary | ICD-10-CM | POA: Diagnosis not present

## 2017-10-14 DIAGNOSIS — R42 Dizziness and giddiness: Secondary | ICD-10-CM | POA: Diagnosis not present

## 2017-10-14 DIAGNOSIS — F419 Anxiety disorder, unspecified: Secondary | ICD-10-CM | POA: Diagnosis not present

## 2017-12-05 DIAGNOSIS — F0150 Vascular dementia without behavioral disturbance: Secondary | ICD-10-CM | POA: Diagnosis not present

## 2017-12-06 DIAGNOSIS — F0150 Vascular dementia without behavioral disturbance: Secondary | ICD-10-CM | POA: Diagnosis not present

## 2017-12-07 DIAGNOSIS — F0150 Vascular dementia without behavioral disturbance: Secondary | ICD-10-CM | POA: Diagnosis not present

## 2017-12-07 DIAGNOSIS — I10 Essential (primary) hypertension: Secondary | ICD-10-CM | POA: Diagnosis not present

## 2017-12-09 DIAGNOSIS — F0150 Vascular dementia without behavioral disturbance: Secondary | ICD-10-CM | POA: Diagnosis not present

## 2017-12-09 DIAGNOSIS — I10 Essential (primary) hypertension: Secondary | ICD-10-CM | POA: Diagnosis not present

## 2017-12-16 DIAGNOSIS — I10 Essential (primary) hypertension: Secondary | ICD-10-CM | POA: Diagnosis not present

## 2017-12-16 DIAGNOSIS — F0150 Vascular dementia without behavioral disturbance: Secondary | ICD-10-CM | POA: Diagnosis not present

## 2017-12-21 DIAGNOSIS — I10 Essential (primary) hypertension: Secondary | ICD-10-CM | POA: Diagnosis not present

## 2017-12-21 DIAGNOSIS — F0150 Vascular dementia without behavioral disturbance: Secondary | ICD-10-CM | POA: Diagnosis not present

## 2017-12-22 DIAGNOSIS — F0150 Vascular dementia without behavioral disturbance: Secondary | ICD-10-CM | POA: Diagnosis not present

## 2017-12-22 DIAGNOSIS — I10 Essential (primary) hypertension: Secondary | ICD-10-CM | POA: Diagnosis not present

## 2017-12-23 DIAGNOSIS — R51 Headache: Secondary | ICD-10-CM | POA: Diagnosis not present

## 2017-12-24 DIAGNOSIS — I10 Essential (primary) hypertension: Secondary | ICD-10-CM | POA: Diagnosis not present

## 2017-12-24 DIAGNOSIS — F0150 Vascular dementia without behavioral disturbance: Secondary | ICD-10-CM | POA: Diagnosis not present

## 2018-01-04 DIAGNOSIS — F0150 Vascular dementia without behavioral disturbance: Secondary | ICD-10-CM | POA: Diagnosis not present

## 2018-01-06 DIAGNOSIS — I10 Essential (primary) hypertension: Secondary | ICD-10-CM | POA: Diagnosis not present

## 2018-01-06 DIAGNOSIS — F0150 Vascular dementia without behavioral disturbance: Secondary | ICD-10-CM | POA: Diagnosis not present

## 2018-01-13 DIAGNOSIS — F0150 Vascular dementia without behavioral disturbance: Secondary | ICD-10-CM | POA: Diagnosis not present

## 2018-01-13 DIAGNOSIS — H5203 Hypermetropia, bilateral: Secondary | ICD-10-CM | POA: Diagnosis not present

## 2018-01-13 DIAGNOSIS — I10 Essential (primary) hypertension: Secondary | ICD-10-CM | POA: Diagnosis not present

## 2018-01-17 DIAGNOSIS — Z6826 Body mass index (BMI) 26.0-26.9, adult: Secondary | ICD-10-CM | POA: Diagnosis not present

## 2018-01-17 DIAGNOSIS — F0150 Vascular dementia without behavioral disturbance: Secondary | ICD-10-CM | POA: Diagnosis not present

## 2018-01-17 DIAGNOSIS — I10 Essential (primary) hypertension: Secondary | ICD-10-CM | POA: Diagnosis not present

## 2018-01-21 DIAGNOSIS — F0150 Vascular dementia without behavioral disturbance: Secondary | ICD-10-CM | POA: Diagnosis not present

## 2018-01-21 DIAGNOSIS — I10 Essential (primary) hypertension: Secondary | ICD-10-CM | POA: Diagnosis not present

## 2018-01-25 DIAGNOSIS — F0150 Vascular dementia without behavioral disturbance: Secondary | ICD-10-CM | POA: Diagnosis not present

## 2018-01-25 DIAGNOSIS — I10 Essential (primary) hypertension: Secondary | ICD-10-CM | POA: Diagnosis not present

## 2018-02-04 DIAGNOSIS — F0150 Vascular dementia without behavioral disturbance: Secondary | ICD-10-CM | POA: Diagnosis not present

## 2018-02-17 DIAGNOSIS — F0150 Vascular dementia without behavioral disturbance: Secondary | ICD-10-CM | POA: Diagnosis not present

## 2018-02-17 DIAGNOSIS — Z6825 Body mass index (BMI) 25.0-25.9, adult: Secondary | ICD-10-CM | POA: Diagnosis not present

## 2018-02-17 DIAGNOSIS — I10 Essential (primary) hypertension: Secondary | ICD-10-CM | POA: Diagnosis not present

## 2018-03-06 DIAGNOSIS — F0150 Vascular dementia without behavioral disturbance: Secondary | ICD-10-CM | POA: Diagnosis not present

## 2018-03-06 DIAGNOSIS — I10 Essential (primary) hypertension: Secondary | ICD-10-CM | POA: Diagnosis not present

## 2018-03-11 ENCOUNTER — Other Ambulatory Visit (HOSPITAL_COMMUNITY): Payer: Self-pay | Admitting: Family Medicine

## 2018-03-11 DIAGNOSIS — Z1231 Encounter for screening mammogram for malignant neoplasm of breast: Secondary | ICD-10-CM

## 2018-03-14 DIAGNOSIS — H25012 Cortical age-related cataract, left eye: Secondary | ICD-10-CM | POA: Diagnosis not present

## 2018-03-14 DIAGNOSIS — H40013 Open angle with borderline findings, low risk, bilateral: Secondary | ICD-10-CM | POA: Diagnosis not present

## 2018-03-14 DIAGNOSIS — H35362 Drusen (degenerative) of macula, left eye: Secondary | ICD-10-CM | POA: Diagnosis not present

## 2018-03-14 DIAGNOSIS — H2512 Age-related nuclear cataract, left eye: Secondary | ICD-10-CM | POA: Diagnosis not present

## 2018-03-14 DIAGNOSIS — H2513 Age-related nuclear cataract, bilateral: Secondary | ICD-10-CM | POA: Diagnosis not present

## 2018-03-14 DIAGNOSIS — H25013 Cortical age-related cataract, bilateral: Secondary | ICD-10-CM | POA: Diagnosis not present

## 2018-03-16 ENCOUNTER — Ambulatory Visit (HOSPITAL_COMMUNITY): Payer: Medicare HMO

## 2018-03-16 DIAGNOSIS — F0150 Vascular dementia without behavioral disturbance: Secondary | ICD-10-CM | POA: Diagnosis not present

## 2018-03-16 DIAGNOSIS — E785 Hyperlipidemia, unspecified: Secondary | ICD-10-CM | POA: Diagnosis not present

## 2018-03-16 DIAGNOSIS — G40209 Localization-related (focal) (partial) symptomatic epilepsy and epileptic syndromes with complex partial seizures, not intractable, without status epilepticus: Secondary | ICD-10-CM | POA: Diagnosis not present

## 2018-03-16 DIAGNOSIS — I10 Essential (primary) hypertension: Secondary | ICD-10-CM | POA: Diagnosis not present

## 2018-03-16 DIAGNOSIS — Z6825 Body mass index (BMI) 25.0-25.9, adult: Secondary | ICD-10-CM | POA: Diagnosis not present

## 2018-03-21 ENCOUNTER — Encounter (HOSPITAL_COMMUNITY): Payer: Self-pay

## 2018-03-21 ENCOUNTER — Ambulatory Visit (HOSPITAL_COMMUNITY)
Admission: RE | Admit: 2018-03-21 | Discharge: 2018-03-21 | Disposition: A | Discharge status: Home / Self Care | Payer: Medicare HMO | Source: Ambulatory Visit | Attending: Family Medicine | Admitting: Family Medicine

## 2018-03-21 DIAGNOSIS — Z1231 Encounter for screening mammogram for malignant neoplasm of breast: Secondary | ICD-10-CM | POA: Diagnosis not present

## 2018-03-22 DIAGNOSIS — H25812 Combined forms of age-related cataract, left eye: Secondary | ICD-10-CM | POA: Diagnosis not present

## 2018-03-22 DIAGNOSIS — H2512 Age-related nuclear cataract, left eye: Secondary | ICD-10-CM | POA: Diagnosis not present

## 2018-04-06 DIAGNOSIS — I10 Essential (primary) hypertension: Secondary | ICD-10-CM | POA: Diagnosis not present

## 2018-04-06 DIAGNOSIS — F0150 Vascular dementia without behavioral disturbance: Secondary | ICD-10-CM | POA: Diagnosis not present

## 2018-04-11 ENCOUNTER — Other Ambulatory Visit (HOSPITAL_COMMUNITY): Payer: Self-pay | Admitting: Family Medicine

## 2018-04-11 DIAGNOSIS — R921 Mammographic calcification found on diagnostic imaging of breast: Secondary | ICD-10-CM

## 2018-04-18 DIAGNOSIS — H25011 Cortical age-related cataract, right eye: Secondary | ICD-10-CM | POA: Diagnosis not present

## 2018-04-18 DIAGNOSIS — H2511 Age-related nuclear cataract, right eye: Secondary | ICD-10-CM | POA: Diagnosis not present

## 2018-04-26 ENCOUNTER — Encounter (HOSPITAL_COMMUNITY): Payer: Medicare HMO

## 2018-04-27 DIAGNOSIS — Z6826 Body mass index (BMI) 26.0-26.9, adult: Secondary | ICD-10-CM | POA: Diagnosis not present

## 2018-04-27 DIAGNOSIS — M150 Primary generalized (osteo)arthritis: Secondary | ICD-10-CM | POA: Diagnosis not present

## 2018-04-27 DIAGNOSIS — R413 Other amnesia: Secondary | ICD-10-CM | POA: Diagnosis not present

## 2018-04-27 DIAGNOSIS — F411 Generalized anxiety disorder: Secondary | ICD-10-CM | POA: Diagnosis not present

## 2018-04-27 DIAGNOSIS — Z9010 Acquired absence of unspecified breast and nipple: Secondary | ICD-10-CM | POA: Diagnosis not present

## 2018-04-27 DIAGNOSIS — E785 Hyperlipidemia, unspecified: Secondary | ICD-10-CM | POA: Diagnosis not present

## 2018-04-27 DIAGNOSIS — M069 Rheumatoid arthritis, unspecified: Secondary | ICD-10-CM | POA: Diagnosis not present

## 2018-04-27 DIAGNOSIS — G311 Senile degeneration of brain, not elsewhere classified: Secondary | ICD-10-CM | POA: Diagnosis not present

## 2018-04-28 DIAGNOSIS — F411 Generalized anxiety disorder: Secondary | ICD-10-CM | POA: Diagnosis not present

## 2018-04-28 DIAGNOSIS — Z9010 Acquired absence of unspecified breast and nipple: Secondary | ICD-10-CM | POA: Diagnosis not present

## 2018-04-28 DIAGNOSIS — R413 Other amnesia: Secondary | ICD-10-CM | POA: Diagnosis not present

## 2018-04-28 DIAGNOSIS — M069 Rheumatoid arthritis, unspecified: Secondary | ICD-10-CM | POA: Diagnosis not present

## 2018-04-28 DIAGNOSIS — E785 Hyperlipidemia, unspecified: Secondary | ICD-10-CM | POA: Diagnosis not present

## 2018-04-28 DIAGNOSIS — G311 Senile degeneration of brain, not elsewhere classified: Secondary | ICD-10-CM | POA: Diagnosis not present

## 2018-04-29 DIAGNOSIS — E785 Hyperlipidemia, unspecified: Secondary | ICD-10-CM | POA: Diagnosis not present

## 2018-04-29 DIAGNOSIS — M069 Rheumatoid arthritis, unspecified: Secondary | ICD-10-CM | POA: Diagnosis not present

## 2018-04-29 DIAGNOSIS — Z9010 Acquired absence of unspecified breast and nipple: Secondary | ICD-10-CM | POA: Diagnosis not present

## 2018-04-29 DIAGNOSIS — R413 Other amnesia: Secondary | ICD-10-CM | POA: Diagnosis not present

## 2018-04-29 DIAGNOSIS — G311 Senile degeneration of brain, not elsewhere classified: Secondary | ICD-10-CM | POA: Diagnosis not present

## 2018-04-29 DIAGNOSIS — F411 Generalized anxiety disorder: Secondary | ICD-10-CM | POA: Diagnosis not present

## 2018-05-07 DIAGNOSIS — E785 Hyperlipidemia, unspecified: Secondary | ICD-10-CM | POA: Diagnosis not present

## 2018-05-07 DIAGNOSIS — M069 Rheumatoid arthritis, unspecified: Secondary | ICD-10-CM | POA: Diagnosis not present

## 2018-05-07 DIAGNOSIS — I10 Essential (primary) hypertension: Secondary | ICD-10-CM | POA: Diagnosis not present

## 2018-05-07 DIAGNOSIS — Z9010 Acquired absence of unspecified breast and nipple: Secondary | ICD-10-CM | POA: Diagnosis not present

## 2018-05-07 DIAGNOSIS — F411 Generalized anxiety disorder: Secondary | ICD-10-CM | POA: Diagnosis not present

## 2018-05-07 DIAGNOSIS — R413 Other amnesia: Secondary | ICD-10-CM | POA: Diagnosis not present

## 2018-05-07 DIAGNOSIS — G311 Senile degeneration of brain, not elsewhere classified: Secondary | ICD-10-CM | POA: Diagnosis not present

## 2018-05-07 DIAGNOSIS — F0150 Vascular dementia without behavioral disturbance: Secondary | ICD-10-CM | POA: Diagnosis not present

## 2018-05-07 IMAGING — DX DG ABDOMEN 1V
1 series · 1 of 1 positions shown · non-contrast
Comparison: October 12, 2016

CLINICAL DATA: Constipation and nausea

EXAM:
ABDOMEN - 1 VIEW

[abdomen kub]
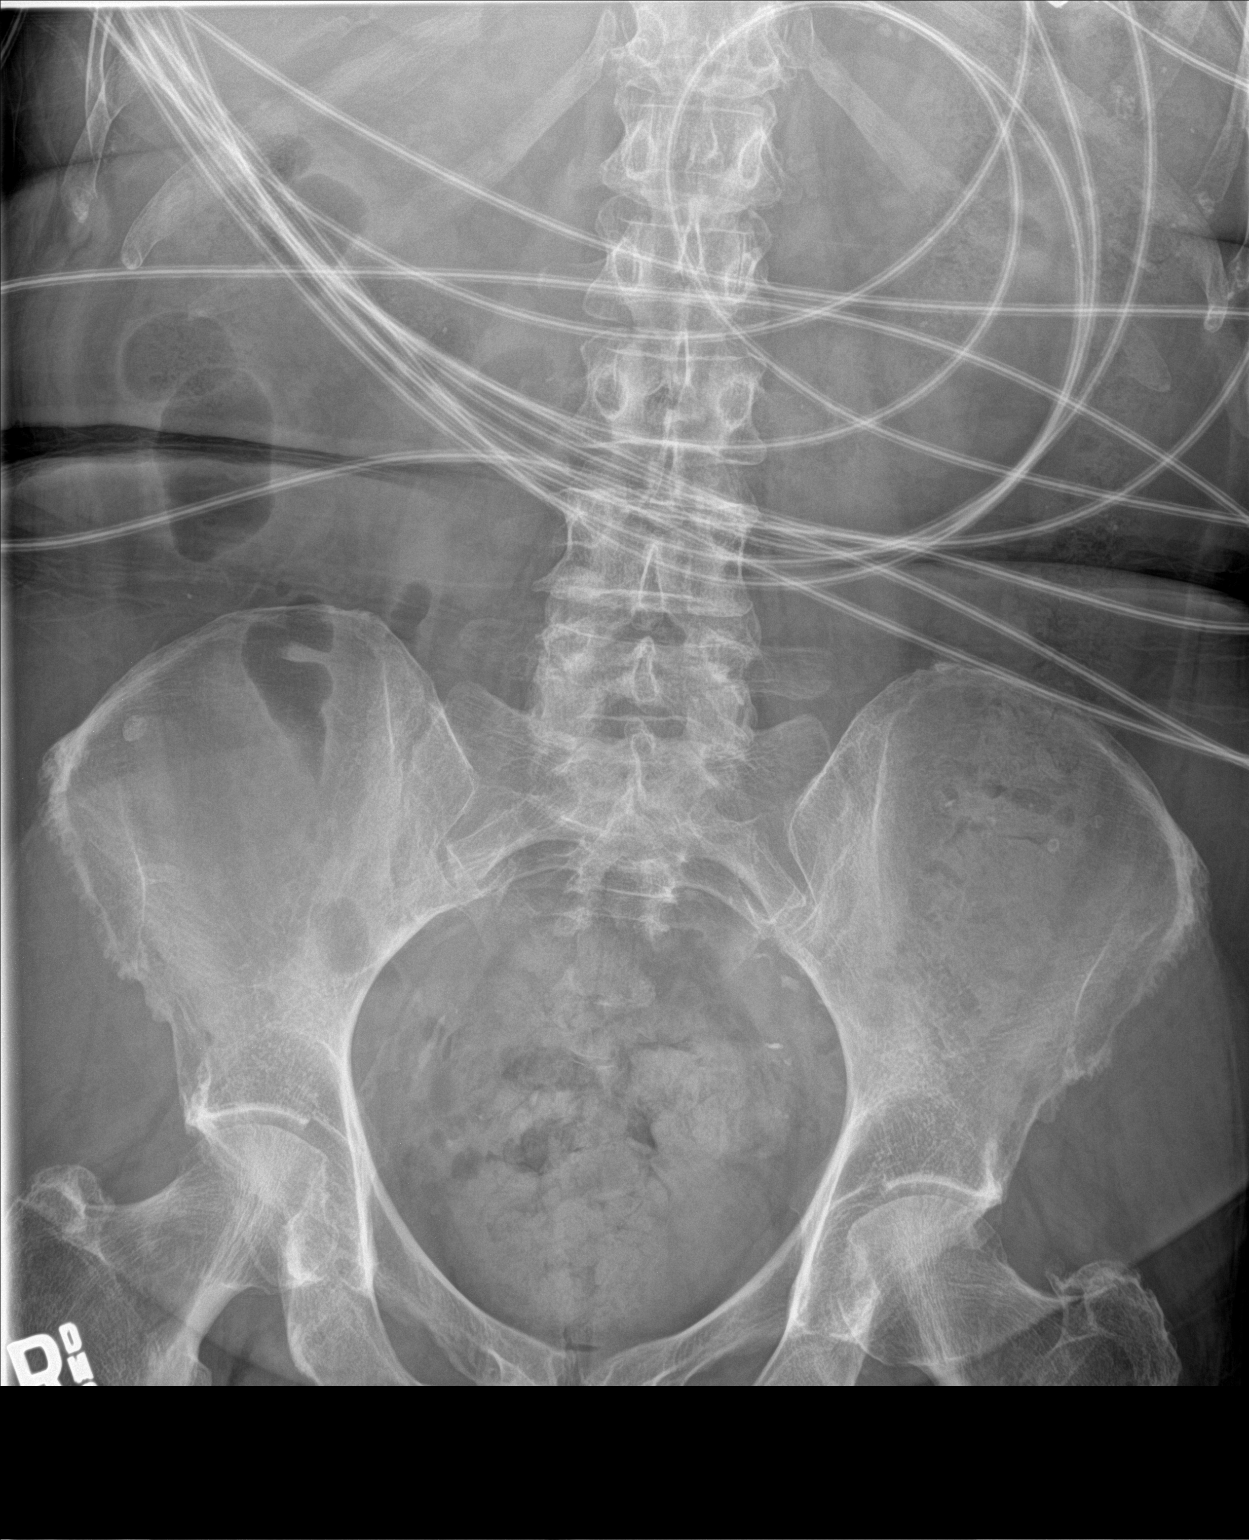

[1 of 1 positions shown; findings below may reference images not displayed]

FINDINGS: There is moderate stool throughout the colon. There is no bowel
dilatation or air-fluid level to suggest bowel obstruction. No free
air. There are phleboliths in the pelvis. There are foci of iliac
artery calcification bilaterally. Bones are osteoporotic.
IMPRESSION: Moderate stool throughout the colon. No bowel obstruction or free
air evident. Iliac artery atherosclerosis.

## 2018-05-08 ENCOUNTER — Other Ambulatory Visit: Payer: Self-pay

## 2018-05-08 ENCOUNTER — Emergency Department (HOSPITAL_COMMUNITY): Payer: Medicare Other

## 2018-05-08 ENCOUNTER — Emergency Department (HOSPITAL_COMMUNITY)
Admission: EM | Admit: 2018-05-08 | Discharge: 2018-05-08 | Disposition: A | Discharge status: Home / Self Care | Payer: Medicare Other | Attending: Emergency Medicine | Admitting: Emergency Medicine

## 2018-05-08 ENCOUNTER — Encounter (HOSPITAL_COMMUNITY): Payer: Self-pay | Admitting: Emergency Medicine

## 2018-05-08 DIAGNOSIS — R531 Weakness: Secondary | ICD-10-CM

## 2018-05-08 DIAGNOSIS — R197 Diarrhea, unspecified: Secondary | ICD-10-CM | POA: Diagnosis not present

## 2018-05-08 DIAGNOSIS — R05 Cough: Secondary | ICD-10-CM

## 2018-05-08 DIAGNOSIS — R059 Cough, unspecified: Secondary | ICD-10-CM

## 2018-05-08 DIAGNOSIS — E860 Dehydration: Secondary | ICD-10-CM | POA: Diagnosis not present

## 2018-05-08 DIAGNOSIS — Z853 Personal history of malignant neoplasm of breast: Secondary | ICD-10-CM | POA: Diagnosis not present

## 2018-05-08 LAB — COMPREHENSIVE METABOLIC PANEL
ALK PHOS: 45 U/L (ref 38–126)
ALT: 32 U/L (ref 0–44)
ANION GAP: 9 (ref 5–15)
AST: 28 U/L (ref 15–41)
Albumin: 4.5 g/dL (ref 3.5–5.0)
BILIRUBIN TOTAL: 0.8 mg/dL (ref 0.3–1.2)
BUN: 10 mg/dL (ref 8–23)
CO2: 29 mmol/L (ref 22–32)
CREATININE: 0.77 mg/dL (ref 0.44–1.00)
Calcium: 9.4 mg/dL (ref 8.9–10.3)
Chloride: 100 mmol/L (ref 98–111)
GFR calc non Af Amer: >60 mL/min (ref >60)
GLUCOSE: 93 mg/dL (ref 70–99)
Potassium: 3.8 mmol/L (ref 3.5–5.1)
Sodium: 138 mmol/L (ref 135–145)
TOTAL PROTEIN: 7.9 g/dL (ref 6.5–8.1)

## 2018-05-08 LAB — URINALYSIS, ROUTINE W REFLEX MICROSCOPIC
Bilirubin Urine: NEGATIVE
Glucose, UA: NEGATIVE mg/dL
Hgb urine dipstick: NEGATIVE
Ketones, ur: NEGATIVE mg/dL
LEUKOCYTES UA: NEGATIVE
NITRITE: NEGATIVE
PH: 7 (ref 5.0–8.0)
Protein, ur: NEGATIVE mg/dL
SPECIFIC GRAVITY, URINE: 1.009 (ref 1.005–1.030)

## 2018-05-08 LAB — CBC
HCT: 48.5 % — ABNORMAL HIGH (ref 36.0–46.0)
Hemoglobin: 15.3 g/dL — ABNORMAL HIGH (ref 12.0–15.0)
MCH: 29.3 pg (ref 26.0–34.0)
MCHC: 31.5 g/dL (ref 30.0–36.0)
MCV: 92.9 fL (ref 80.0–100.0)
NRBC: 0 % (ref 0.0–0.2)
Platelets: 195 10*3/uL (ref 150–400)
RBC: 5.22 MIL/uL — AB (ref 3.87–5.11)
RDW: 14.5 % (ref 11.5–15.5)
WBC: 8 10*3/uL (ref 4.0–10.5)

## 2018-05-08 LAB — LIPASE, BLOOD: Lipase: 32 U/L (ref 11–51)

## 2018-05-08 LAB — TROPONIN I: Troponin I: <0.03 ng/mL (ref <0.03)

## 2018-05-08 MED ORDER — SODIUM CHLORIDE 0.9% FLUSH: 3.0000 mL | Freq: Once | INTRAVENOUS | Status: DC

## 2018-05-08 MED ORDER — SODIUM CHLORIDE 0.9 % IV BOLUS
500.0000 mL | Freq: Once | INTRAVENOUS | Status: AC
  Administered 2018-05-08: 500 mL via INTRAVENOUS

## 2018-05-08 NOTE — ED Triage Notes (Signed)
Pt c/o cough, diarrhea, and weakness in leg x 1 hour

## 2018-05-08 NOTE — Discharge Instructions (Signed)
As we discussed, we believe her symptoms are caused today by mild volume depletion, or mild dehydration, without any evidence of damage to your body.  Please drink plenty of clear fluids such as water and follow up with your regular doctor or the doctors listed in his documentation at the next available opportunity.  Return to the emergency department with any new or worsening symptoms that concern you, including but not limited to fever, shortness of breath, chest pain, or other concerning symptoms.

## 2018-05-08 NOTE — ED Provider Notes (Signed)
Emergency Department Provider Note   I have reviewed the triage vital signs and the nursing notes.   HISTORY  Chief Complaint Diarrhea   HPI Tonya Cortez is a 78 y.o. female with PMH of HLD and dementia presents to the emergency department with cough, diarrhea, and bilateral lower extremity weakness which began today.  The patient's husband, at bedside, provides most of the history.  He states that the patient woke up at 3 AM with some cough.  He gave an over-the-counter cough medication which she has taken in the past without complication.  They went to church but during the service the patient became very diaphoretic and weak.  She continued to have some coughing and had one episode of diarrhea while at church.  The patient denied any chest pain but had complained of some nausea.  Symptoms have resolved.  The patient left the service and was attended to by nurses on scene.  The husband left church and presented to the emergency department.  They state that the patient is at her mental status baseline.  The patient denies any chest pain or abdominal pain.  She denies any shortness of breath.   Past Medical History:  Diagnosis Date  . Arthritis   . Bell's palsy   . Borderline diabetes   . Cancer (HCC)    breast  . Diverticulosis   . Hypercholesterolemia   . Memory difficulties   . Unsteady gait   . Vertigo     Patient Active Problem List   Diagnosis Date Noted  . Adenomatous polyp 01/02/2014    Past Surgical History:  Procedure Laterality Date  . ABDOMINAL HYSTERECTOMY    . BREAST SURGERY    . COLONOSCOPY  10/29/2008   GEX:BMWUXL examination with some left-sided diverticula/Diminutive cecal polyp, status post cold biopsy removal.  Right colonic mucosa appeared normal. adenomatous  . COLONOSCOPY N/A 01/17/2014   Procedure: COLONOSCOPY;  Surgeon: Daneil Dolin, MD;  Location: [facility];  Service: Endoscopy;  Laterality: N/A;  1115  . MASTECTOMY Left    abnormal  mass in breast, non-cancerous per patient.   Marland Kitchen MASTECTOMY     Allergies Patient has no known allergies.  Family History  Problem Relation Age of Onset  . Colon cancer Neg Hx     Social History Social History   Tobacco Use  . Smoking status: Never Smoker  . Smokeless tobacco: Never Used  Substance Use Topics  . Alcohol use: No  . Drug use: No    Review of Systems  Constitutional: No fever/chills. Positive diaphoresis and generalized weakness.  Eyes: No visual changes. ENT: No sore throat. Cardiovascular: Denies chest pain. Respiratory: Denies shortness of breath. Positive cough.  Gastrointestinal: No abdominal pain.  No nausea, no vomiting. Positive non-bloody diarrhea.  No constipation. Genitourinary: Negative for dysuria. Musculoskeletal: Negative for back pain. Skin: Negative for rash. Neurological: Negative for headaches, focal weakness or numbness.  10-point ROS otherwise negative.  ____________________________________________   PHYSICAL EXAM:  VITAL SIGNS: ED Triage Vitals [05/08/18 1216]  Enc Vitals Group     BP 125/78     Pulse Rate 60     Resp 20     Temp 97.7 F (36.5 C)     Temp Source Oral     SpO2 98 %     Weight 160 lb (72.6 kg)     Height 5\' 7"  (1.702 m)   Constitutional: Alert with mild confusion (baseline). Well appearing and in no acute distress.  Eyes: Conjunctivae are normal.  Head: Atraumatic. Nose: No congestion/rhinnorhea. Mouth/Throat: Mucous membranes are moist. Neck: No stridor.   Cardiovascular: Normal rate, regular rhythm. Good peripheral circulation. Grossly normal heart sounds.   Respiratory: Normal respiratory effort.  No retractions. Lungs CTAB. Gastrointestinal: Soft and nontender. No distention.  Musculoskeletal: No lower extremity tenderness nor edema. No gross deformities of extremities. Neurologic:  Normal speech and language. No gross focal neurologic deficits are appreciated.  Skin:  Skin is warm, dry and intact.  No rash noted.   ____________________________________________   LABS (all labs ordered are listed, but only abnormal results are displayed)  Labs Reviewed  CBC - Abnormal; Notable for the following components:      Result Value   RBC 5.22 (*)    Hemoglobin 15.3 (*)    HCT 48.5 (*)    All other components within normal limits  URINALYSIS, ROUTINE W REFLEX MICROSCOPIC - Abnormal; Notable for the following components:   Color, Urine STRAW (*)    All other components within normal limits  URINE CULTURE  LIPASE, BLOOD  COMPREHENSIVE METABOLIC PANEL  TROPONIN I   ____________________________________________  EKG   EKG Interpretation  Date/Time:  Sunday May 08 2018 17:01:06 EST Ventricular Rate:  58 PR Interval:    QRS Duration: 107 QT Interval:  441 QTC Calculation: 434 R Axis:   3 Text Interpretation:  Sinus rhythm Low voltage, precordial leads Baseline wander in lead(s) I aVR aVL V6 No STEMI.  Confirmed by Nanda Quinton [phone]) on 05/08/2018 5:02:54 PM       ____________________________________________  RADIOLOGY  Dg Chest 2 View  Result Date: 05/08/2018 CLINICAL DATA:  Pt c/o cough, diarrhea, and weakness in leg x 1 hour EXAM: CHEST - 2 VIEW COMPARISON:  04/23/2017 FINDINGS: Cardiac silhouette is normal in size. No mediastinal or hilar masses. No evidence of adenopathy. Lungs are clear.  No pleural effusion or pneumothorax. Stable changes from left breast surgery. Skeletal structures are intact. IMPRESSION: No active cardiopulmonary disease. Electronically Signed   By: Lajean Manes M.D.   On: 05/08/2018 13:40    ____________________________________________   PROCEDURES  Procedure(s) performed:   Procedures  None ____________________________________________   INITIAL IMPRESSION / ASSESSMENT AND PLAN / ED COURSE  Pertinent labs & imaging results that were available during my care of the patient were reviewed by me and considered in my medical decision  making (see chart for details).  Patient presents to the emergency department with mild cough, diarrhea, episode of diaphoresis/nausea.  Patient is well-appearing here with normal neurologic exam.  He has good strength in all extremities.  She is awake and alert.  Lungs are clear.  Chest x-ray shows no infiltrate.  No hypoxia or other abnormal vital signs.  No tenderness on abdominal exam.  I do not feel the patient would benefit from advanced imaging of the head or abdomen at this time.  Plan for gentle IV fluids, screening labs, troponin, EKG, and reassess.  05:20 PM Patient with mild orthostasis.  She is feeling better after IV fluids.  Lab work is unremarkable.  EKG unremarkable.  Discussed with patient and husband at bedside.  They will call the PCP tomorrow to schedule a follow-up appointment and return to the emergency department with any new or worsening symptoms. Discussed ED return precautions in detail.  ____________________________________________  FINAL CLINICAL IMPRESSION(S) / ED DIAGNOSES  Final diagnoses:  Dehydration  Cough  Generalized weakness    MEDICATIONS GIVEN DURING THIS VISIT:  Medications  sodium chloride  flush (NS) 0.9 % injection 3 mL (has no administration in time range)  sodium chloride 0.9 % bolus 500 mL (500 mLs Intravenous New Bag/Given 05/08/18 1711)    Note:  This document was prepared using Dragon voice recognition software and may include unintentional dictation errors.  Nanda Quinton, MD Emergency Medicine    Janit Cutter, [facility], MD 05/08/18 [phone]

## 2018-05-10 LAB — URINE CULTURE: Special Requests: NORMAL

## 2018-05-11 DIAGNOSIS — F411 Generalized anxiety disorder: Secondary | ICD-10-CM | POA: Diagnosis not present

## 2018-05-11 DIAGNOSIS — R413 Other amnesia: Secondary | ICD-10-CM | POA: Diagnosis not present

## 2018-05-11 DIAGNOSIS — Z9010 Acquired absence of unspecified breast and nipple: Secondary | ICD-10-CM | POA: Diagnosis not present

## 2018-05-11 DIAGNOSIS — G311 Senile degeneration of brain, not elsewhere classified: Secondary | ICD-10-CM | POA: Diagnosis not present

## 2018-05-11 DIAGNOSIS — E785 Hyperlipidemia, unspecified: Secondary | ICD-10-CM | POA: Diagnosis not present

## 2018-05-11 DIAGNOSIS — M069 Rheumatoid arthritis, unspecified: Secondary | ICD-10-CM | POA: Diagnosis not present

## 2018-05-13 DIAGNOSIS — Z9010 Acquired absence of unspecified breast and nipple: Secondary | ICD-10-CM | POA: Diagnosis not present

## 2018-05-13 DIAGNOSIS — E785 Hyperlipidemia, unspecified: Secondary | ICD-10-CM | POA: Diagnosis not present

## 2018-05-13 DIAGNOSIS — R413 Other amnesia: Secondary | ICD-10-CM | POA: Diagnosis not present

## 2018-05-13 DIAGNOSIS — G311 Senile degeneration of brain, not elsewhere classified: Secondary | ICD-10-CM | POA: Diagnosis not present

## 2018-05-13 DIAGNOSIS — M069 Rheumatoid arthritis, unspecified: Secondary | ICD-10-CM | POA: Diagnosis not present

## 2018-05-13 DIAGNOSIS — F411 Generalized anxiety disorder: Secondary | ICD-10-CM | POA: Diagnosis not present

## 2018-05-16 DIAGNOSIS — F411 Generalized anxiety disorder: Secondary | ICD-10-CM | POA: Diagnosis not present

## 2018-05-16 DIAGNOSIS — M069 Rheumatoid arthritis, unspecified: Secondary | ICD-10-CM | POA: Diagnosis not present

## 2018-05-16 DIAGNOSIS — R413 Other amnesia: Secondary | ICD-10-CM | POA: Diagnosis not present

## 2018-05-16 DIAGNOSIS — G311 Senile degeneration of brain, not elsewhere classified: Secondary | ICD-10-CM | POA: Diagnosis not present

## 2018-05-16 DIAGNOSIS — Z9010 Acquired absence of unspecified breast and nipple: Secondary | ICD-10-CM | POA: Diagnosis not present

## 2018-05-16 DIAGNOSIS — E785 Hyperlipidemia, unspecified: Secondary | ICD-10-CM | POA: Diagnosis not present

## 2018-05-17 ENCOUNTER — Encounter (HOSPITAL_COMMUNITY): Payer: Medicare HMO

## 2018-05-17 DIAGNOSIS — H2511 Age-related nuclear cataract, right eye: Secondary | ICD-10-CM | POA: Diagnosis not present

## 2018-05-17 DIAGNOSIS — H25811 Combined forms of age-related cataract, right eye: Secondary | ICD-10-CM | POA: Diagnosis not present

## 2018-05-18 DIAGNOSIS — E785 Hyperlipidemia, unspecified: Secondary | ICD-10-CM | POA: Diagnosis not present

## 2018-05-18 DIAGNOSIS — Z9010 Acquired absence of unspecified breast and nipple: Secondary | ICD-10-CM | POA: Diagnosis not present

## 2018-05-18 DIAGNOSIS — M069 Rheumatoid arthritis, unspecified: Secondary | ICD-10-CM | POA: Diagnosis not present

## 2018-05-18 DIAGNOSIS — G311 Senile degeneration of brain, not elsewhere classified: Secondary | ICD-10-CM | POA: Diagnosis not present

## 2018-05-18 DIAGNOSIS — F411 Generalized anxiety disorder: Secondary | ICD-10-CM | POA: Diagnosis not present

## 2018-05-18 DIAGNOSIS — R413 Other amnesia: Secondary | ICD-10-CM | POA: Diagnosis not present

## 2018-05-20 DIAGNOSIS — Z9010 Acquired absence of unspecified breast and nipple: Secondary | ICD-10-CM | POA: Diagnosis not present

## 2018-05-20 DIAGNOSIS — E785 Hyperlipidemia, unspecified: Secondary | ICD-10-CM | POA: Diagnosis not present

## 2018-05-20 DIAGNOSIS — F411 Generalized anxiety disorder: Secondary | ICD-10-CM | POA: Diagnosis not present

## 2018-05-20 DIAGNOSIS — G311 Senile degeneration of brain, not elsewhere classified: Secondary | ICD-10-CM | POA: Diagnosis not present

## 2018-05-20 DIAGNOSIS — R413 Other amnesia: Secondary | ICD-10-CM | POA: Diagnosis not present

## 2018-05-20 DIAGNOSIS — M069 Rheumatoid arthritis, unspecified: Secondary | ICD-10-CM | POA: Diagnosis not present

## 2018-05-23 DIAGNOSIS — E785 Hyperlipidemia, unspecified: Secondary | ICD-10-CM | POA: Diagnosis not present

## 2018-05-23 DIAGNOSIS — Z9010 Acquired absence of unspecified breast and nipple: Secondary | ICD-10-CM | POA: Diagnosis not present

## 2018-05-23 DIAGNOSIS — G311 Senile degeneration of brain, not elsewhere classified: Secondary | ICD-10-CM | POA: Diagnosis not present

## 2018-05-23 DIAGNOSIS — M069 Rheumatoid arthritis, unspecified: Secondary | ICD-10-CM | POA: Diagnosis not present

## 2018-05-23 DIAGNOSIS — R413 Other amnesia: Secondary | ICD-10-CM | POA: Diagnosis not present

## 2018-05-23 DIAGNOSIS — F411 Generalized anxiety disorder: Secondary | ICD-10-CM | POA: Diagnosis not present

## 2018-05-24 ENCOUNTER — Encounter (HOSPITAL_COMMUNITY): Payer: Medicare HMO

## 2018-05-25 DIAGNOSIS — F411 Generalized anxiety disorder: Secondary | ICD-10-CM | POA: Diagnosis not present

## 2018-05-25 DIAGNOSIS — G311 Senile degeneration of brain, not elsewhere classified: Secondary | ICD-10-CM | POA: Diagnosis not present

## 2018-05-25 DIAGNOSIS — M069 Rheumatoid arthritis, unspecified: Secondary | ICD-10-CM | POA: Diagnosis not present

## 2018-05-25 DIAGNOSIS — R413 Other amnesia: Secondary | ICD-10-CM | POA: Diagnosis not present

## 2018-05-25 DIAGNOSIS — Z9010 Acquired absence of unspecified breast and nipple: Secondary | ICD-10-CM | POA: Diagnosis not present

## 2018-05-25 DIAGNOSIS — E785 Hyperlipidemia, unspecified: Secondary | ICD-10-CM | POA: Diagnosis not present

## 2018-05-26 ENCOUNTER — Ambulatory Visit (HOSPITAL_COMMUNITY)
Admission: RE | Admit: 2018-05-26 | Discharge: 2018-05-26 | Disposition: A | Discharge status: Home / Self Care | Payer: Medicare Other | Source: Ambulatory Visit | Attending: Family Medicine | Admitting: Family Medicine

## 2018-05-26 DIAGNOSIS — R921 Mammographic calcification found on diagnostic imaging of breast: Secondary | ICD-10-CM | POA: Insufficient documentation

## 2018-05-26 DIAGNOSIS — R928 Other abnormal and inconclusive findings on diagnostic imaging of breast: Secondary | ICD-10-CM | POA: Diagnosis not present

## 2018-05-27 DIAGNOSIS — M069 Rheumatoid arthritis, unspecified: Secondary | ICD-10-CM | POA: Diagnosis not present

## 2018-05-27 DIAGNOSIS — G311 Senile degeneration of brain, not elsewhere classified: Secondary | ICD-10-CM | POA: Diagnosis not present

## 2018-05-27 DIAGNOSIS — R413 Other amnesia: Secondary | ICD-10-CM | POA: Diagnosis not present

## 2018-05-27 DIAGNOSIS — F411 Generalized anxiety disorder: Secondary | ICD-10-CM | POA: Diagnosis not present

## 2018-05-27 DIAGNOSIS — E785 Hyperlipidemia, unspecified: Secondary | ICD-10-CM | POA: Diagnosis not present

## 2018-05-27 DIAGNOSIS — Z9010 Acquired absence of unspecified breast and nipple: Secondary | ICD-10-CM | POA: Diagnosis not present

## 2018-05-30 DIAGNOSIS — R413 Other amnesia: Secondary | ICD-10-CM | POA: Diagnosis not present

## 2018-05-30 DIAGNOSIS — G311 Senile degeneration of brain, not elsewhere classified: Secondary | ICD-10-CM | POA: Diagnosis not present

## 2018-05-30 DIAGNOSIS — E785 Hyperlipidemia, unspecified: Secondary | ICD-10-CM | POA: Diagnosis not present

## 2018-05-30 DIAGNOSIS — Z9010 Acquired absence of unspecified breast and nipple: Secondary | ICD-10-CM | POA: Diagnosis not present

## 2018-05-30 DIAGNOSIS — F411 Generalized anxiety disorder: Secondary | ICD-10-CM | POA: Diagnosis not present

## 2018-05-30 DIAGNOSIS — M069 Rheumatoid arthritis, unspecified: Secondary | ICD-10-CM | POA: Diagnosis not present

## 2018-06-01 DIAGNOSIS — F411 Generalized anxiety disorder: Secondary | ICD-10-CM | POA: Diagnosis not present

## 2018-06-01 DIAGNOSIS — M069 Rheumatoid arthritis, unspecified: Secondary | ICD-10-CM | POA: Diagnosis not present

## 2018-06-01 DIAGNOSIS — G311 Senile degeneration of brain, not elsewhere classified: Secondary | ICD-10-CM | POA: Diagnosis not present

## 2018-06-01 DIAGNOSIS — R413 Other amnesia: Secondary | ICD-10-CM | POA: Diagnosis not present

## 2018-06-01 DIAGNOSIS — Z9010 Acquired absence of unspecified breast and nipple: Secondary | ICD-10-CM | POA: Diagnosis not present

## 2018-06-01 DIAGNOSIS — E785 Hyperlipidemia, unspecified: Secondary | ICD-10-CM | POA: Diagnosis not present

## 2018-06-03 DIAGNOSIS — G311 Senile degeneration of brain, not elsewhere classified: Secondary | ICD-10-CM | POA: Diagnosis not present

## 2018-06-03 DIAGNOSIS — Z9010 Acquired absence of unspecified breast and nipple: Secondary | ICD-10-CM | POA: Diagnosis not present

## 2018-06-03 DIAGNOSIS — E785 Hyperlipidemia, unspecified: Secondary | ICD-10-CM | POA: Diagnosis not present

## 2018-06-03 DIAGNOSIS — R413 Other amnesia: Secondary | ICD-10-CM | POA: Diagnosis not present

## 2018-06-03 DIAGNOSIS — F411 Generalized anxiety disorder: Secondary | ICD-10-CM | POA: Diagnosis not present

## 2018-06-03 DIAGNOSIS — M069 Rheumatoid arthritis, unspecified: Secondary | ICD-10-CM | POA: Diagnosis not present

## 2018-06-04 DIAGNOSIS — F0150 Vascular dementia without behavioral disturbance: Secondary | ICD-10-CM | POA: Diagnosis not present

## 2018-06-05 DIAGNOSIS — E785 Hyperlipidemia, unspecified: Secondary | ICD-10-CM | POA: Diagnosis not present

## 2018-06-05 DIAGNOSIS — Z9010 Acquired absence of unspecified breast and nipple: Secondary | ICD-10-CM | POA: Diagnosis not present

## 2018-06-05 DIAGNOSIS — F411 Generalized anxiety disorder: Secondary | ICD-10-CM | POA: Diagnosis not present

## 2018-06-05 DIAGNOSIS — R413 Other amnesia: Secondary | ICD-10-CM | POA: Diagnosis not present

## 2018-06-05 DIAGNOSIS — G311 Senile degeneration of brain, not elsewhere classified: Secondary | ICD-10-CM | POA: Diagnosis not present

## 2018-06-05 DIAGNOSIS — M069 Rheumatoid arthritis, unspecified: Secondary | ICD-10-CM | POA: Diagnosis not present

## 2018-06-06 DIAGNOSIS — Z9010 Acquired absence of unspecified breast and nipple: Secondary | ICD-10-CM | POA: Diagnosis not present

## 2018-06-06 DIAGNOSIS — E785 Hyperlipidemia, unspecified: Secondary | ICD-10-CM | POA: Diagnosis not present

## 2018-06-06 DIAGNOSIS — M069 Rheumatoid arthritis, unspecified: Secondary | ICD-10-CM | POA: Diagnosis not present

## 2018-06-06 DIAGNOSIS — R413 Other amnesia: Secondary | ICD-10-CM | POA: Diagnosis not present

## 2018-06-06 DIAGNOSIS — G311 Senile degeneration of brain, not elsewhere classified: Secondary | ICD-10-CM | POA: Diagnosis not present

## 2018-06-06 DIAGNOSIS — F411 Generalized anxiety disorder: Secondary | ICD-10-CM | POA: Diagnosis not present

## 2018-06-08 DIAGNOSIS — F0150 Vascular dementia without behavioral disturbance: Secondary | ICD-10-CM | POA: Diagnosis not present

## 2018-06-08 DIAGNOSIS — I10 Essential (primary) hypertension: Secondary | ICD-10-CM | POA: Diagnosis not present

## 2018-06-09 DIAGNOSIS — E785 Hyperlipidemia, unspecified: Secondary | ICD-10-CM | POA: Diagnosis not present

## 2018-06-09 DIAGNOSIS — M069 Rheumatoid arthritis, unspecified: Secondary | ICD-10-CM | POA: Diagnosis not present

## 2018-06-09 DIAGNOSIS — F411 Generalized anxiety disorder: Secondary | ICD-10-CM | POA: Diagnosis not present

## 2018-06-09 DIAGNOSIS — R413 Other amnesia: Secondary | ICD-10-CM | POA: Diagnosis not present

## 2018-06-09 DIAGNOSIS — Z9010 Acquired absence of unspecified breast and nipple: Secondary | ICD-10-CM | POA: Diagnosis not present

## 2018-06-09 DIAGNOSIS — G311 Senile degeneration of brain, not elsewhere classified: Secondary | ICD-10-CM | POA: Diagnosis not present

## 2018-06-10 DIAGNOSIS — R413 Other amnesia: Secondary | ICD-10-CM | POA: Diagnosis not present

## 2018-06-10 DIAGNOSIS — G311 Senile degeneration of brain, not elsewhere classified: Secondary | ICD-10-CM | POA: Diagnosis not present

## 2018-06-10 DIAGNOSIS — Z9010 Acquired absence of unspecified breast and nipple: Secondary | ICD-10-CM | POA: Diagnosis not present

## 2018-06-10 DIAGNOSIS — E785 Hyperlipidemia, unspecified: Secondary | ICD-10-CM | POA: Diagnosis not present

## 2018-06-10 DIAGNOSIS — M069 Rheumatoid arthritis, unspecified: Secondary | ICD-10-CM | POA: Diagnosis not present

## 2018-06-10 DIAGNOSIS — F411 Generalized anxiety disorder: Secondary | ICD-10-CM | POA: Diagnosis not present

## 2018-06-13 DIAGNOSIS — F411 Generalized anxiety disorder: Secondary | ICD-10-CM | POA: Diagnosis not present

## 2018-06-13 DIAGNOSIS — E785 Hyperlipidemia, unspecified: Secondary | ICD-10-CM | POA: Diagnosis not present

## 2018-06-13 DIAGNOSIS — R413 Other amnesia: Secondary | ICD-10-CM | POA: Diagnosis not present

## 2018-06-13 DIAGNOSIS — M069 Rheumatoid arthritis, unspecified: Secondary | ICD-10-CM | POA: Diagnosis not present

## 2018-06-13 DIAGNOSIS — Z9010 Acquired absence of unspecified breast and nipple: Secondary | ICD-10-CM | POA: Diagnosis not present

## 2018-06-13 DIAGNOSIS — G311 Senile degeneration of brain, not elsewhere classified: Secondary | ICD-10-CM | POA: Diagnosis not present

## 2018-06-15 DIAGNOSIS — F411 Generalized anxiety disorder: Secondary | ICD-10-CM | POA: Diagnosis not present

## 2018-06-15 DIAGNOSIS — E785 Hyperlipidemia, unspecified: Secondary | ICD-10-CM | POA: Diagnosis not present

## 2018-06-15 DIAGNOSIS — R413 Other amnesia: Secondary | ICD-10-CM | POA: Diagnosis not present

## 2018-06-15 DIAGNOSIS — Z9010 Acquired absence of unspecified breast and nipple: Secondary | ICD-10-CM | POA: Diagnosis not present

## 2018-06-15 DIAGNOSIS — G311 Senile degeneration of brain, not elsewhere classified: Secondary | ICD-10-CM | POA: Diagnosis not present

## 2018-06-15 DIAGNOSIS — M069 Rheumatoid arthritis, unspecified: Secondary | ICD-10-CM | POA: Diagnosis not present

## 2018-06-17 DIAGNOSIS — M069 Rheumatoid arthritis, unspecified: Secondary | ICD-10-CM | POA: Diagnosis not present

## 2018-06-17 DIAGNOSIS — E785 Hyperlipidemia, unspecified: Secondary | ICD-10-CM | POA: Diagnosis not present

## 2018-06-17 DIAGNOSIS — R413 Other amnesia: Secondary | ICD-10-CM | POA: Diagnosis not present

## 2018-06-17 DIAGNOSIS — G311 Senile degeneration of brain, not elsewhere classified: Secondary | ICD-10-CM | POA: Diagnosis not present

## 2018-06-17 DIAGNOSIS — F411 Generalized anxiety disorder: Secondary | ICD-10-CM | POA: Diagnosis not present

## 2018-06-17 DIAGNOSIS — Z9010 Acquired absence of unspecified breast and nipple: Secondary | ICD-10-CM | POA: Diagnosis not present

## 2018-06-20 DIAGNOSIS — R413 Other amnesia: Secondary | ICD-10-CM | POA: Diagnosis not present

## 2018-06-20 DIAGNOSIS — Z9010 Acquired absence of unspecified breast and nipple: Secondary | ICD-10-CM | POA: Diagnosis not present

## 2018-06-20 DIAGNOSIS — E785 Hyperlipidemia, unspecified: Secondary | ICD-10-CM | POA: Diagnosis not present

## 2018-06-20 DIAGNOSIS — G311 Senile degeneration of brain, not elsewhere classified: Secondary | ICD-10-CM | POA: Diagnosis not present

## 2018-06-20 DIAGNOSIS — F411 Generalized anxiety disorder: Secondary | ICD-10-CM | POA: Diagnosis not present

## 2018-06-20 DIAGNOSIS — M069 Rheumatoid arthritis, unspecified: Secondary | ICD-10-CM | POA: Diagnosis not present

## 2018-06-22 DIAGNOSIS — G311 Senile degeneration of brain, not elsewhere classified: Secondary | ICD-10-CM | POA: Diagnosis not present

## 2018-06-22 DIAGNOSIS — R413 Other amnesia: Secondary | ICD-10-CM | POA: Diagnosis not present

## 2018-06-22 DIAGNOSIS — Z9010 Acquired absence of unspecified breast and nipple: Secondary | ICD-10-CM | POA: Diagnosis not present

## 2018-06-22 DIAGNOSIS — F411 Generalized anxiety disorder: Secondary | ICD-10-CM | POA: Diagnosis not present

## 2018-06-22 DIAGNOSIS — E785 Hyperlipidemia, unspecified: Secondary | ICD-10-CM | POA: Diagnosis not present

## 2018-06-22 DIAGNOSIS — M069 Rheumatoid arthritis, unspecified: Secondary | ICD-10-CM | POA: Diagnosis not present

## 2018-06-23 DIAGNOSIS — R51 Headache: Secondary | ICD-10-CM | POA: Diagnosis not present

## 2018-06-24 DIAGNOSIS — G311 Senile degeneration of brain, not elsewhere classified: Secondary | ICD-10-CM | POA: Diagnosis not present

## 2018-06-24 DIAGNOSIS — M069 Rheumatoid arthritis, unspecified: Secondary | ICD-10-CM | POA: Diagnosis not present

## 2018-06-24 DIAGNOSIS — E785 Hyperlipidemia, unspecified: Secondary | ICD-10-CM | POA: Diagnosis not present

## 2018-06-24 DIAGNOSIS — R413 Other amnesia: Secondary | ICD-10-CM | POA: Diagnosis not present

## 2018-06-24 DIAGNOSIS — F411 Generalized anxiety disorder: Secondary | ICD-10-CM | POA: Diagnosis not present

## 2018-06-24 DIAGNOSIS — Z9010 Acquired absence of unspecified breast and nipple: Secondary | ICD-10-CM | POA: Diagnosis not present

## 2018-06-27 DIAGNOSIS — F411 Generalized anxiety disorder: Secondary | ICD-10-CM | POA: Diagnosis not present

## 2018-06-27 DIAGNOSIS — M069 Rheumatoid arthritis, unspecified: Secondary | ICD-10-CM | POA: Diagnosis not present

## 2018-06-27 DIAGNOSIS — G311 Senile degeneration of brain, not elsewhere classified: Secondary | ICD-10-CM | POA: Diagnosis not present

## 2018-06-27 DIAGNOSIS — R413 Other amnesia: Secondary | ICD-10-CM | POA: Diagnosis not present

## 2018-06-27 DIAGNOSIS — Z9010 Acquired absence of unspecified breast and nipple: Secondary | ICD-10-CM | POA: Diagnosis not present

## 2018-06-27 DIAGNOSIS — E785 Hyperlipidemia, unspecified: Secondary | ICD-10-CM | POA: Diagnosis not present

## 2018-06-30 DIAGNOSIS — Z9010 Acquired absence of unspecified breast and nipple: Secondary | ICD-10-CM | POA: Diagnosis not present

## 2018-06-30 DIAGNOSIS — M069 Rheumatoid arthritis, unspecified: Secondary | ICD-10-CM | POA: Diagnosis not present

## 2018-06-30 DIAGNOSIS — E785 Hyperlipidemia, unspecified: Secondary | ICD-10-CM | POA: Diagnosis not present

## 2018-06-30 DIAGNOSIS — F411 Generalized anxiety disorder: Secondary | ICD-10-CM | POA: Diagnosis not present

## 2018-06-30 DIAGNOSIS — R413 Other amnesia: Secondary | ICD-10-CM | POA: Diagnosis not present

## 2018-06-30 DIAGNOSIS — G311 Senile degeneration of brain, not elsewhere classified: Secondary | ICD-10-CM | POA: Diagnosis not present

## 2018-07-01 DIAGNOSIS — G311 Senile degeneration of brain, not elsewhere classified: Secondary | ICD-10-CM | POA: Diagnosis not present

## 2018-07-01 DIAGNOSIS — F411 Generalized anxiety disorder: Secondary | ICD-10-CM | POA: Diagnosis not present

## 2018-07-01 DIAGNOSIS — Z9010 Acquired absence of unspecified breast and nipple: Secondary | ICD-10-CM | POA: Diagnosis not present

## 2018-07-01 DIAGNOSIS — R413 Other amnesia: Secondary | ICD-10-CM | POA: Diagnosis not present

## 2018-07-01 DIAGNOSIS — M069 Rheumatoid arthritis, unspecified: Secondary | ICD-10-CM | POA: Diagnosis not present

## 2018-07-01 DIAGNOSIS — E785 Hyperlipidemia, unspecified: Secondary | ICD-10-CM | POA: Diagnosis not present

## 2018-07-04 DIAGNOSIS — F411 Generalized anxiety disorder: Secondary | ICD-10-CM | POA: Diagnosis not present

## 2018-07-04 DIAGNOSIS — Z9010 Acquired absence of unspecified breast and nipple: Secondary | ICD-10-CM | POA: Diagnosis not present

## 2018-07-04 DIAGNOSIS — M069 Rheumatoid arthritis, unspecified: Secondary | ICD-10-CM | POA: Diagnosis not present

## 2018-07-04 DIAGNOSIS — G311 Senile degeneration of brain, not elsewhere classified: Secondary | ICD-10-CM | POA: Diagnosis not present

## 2018-07-04 DIAGNOSIS — F0150 Vascular dementia without behavioral disturbance: Secondary | ICD-10-CM | POA: Diagnosis not present

## 2018-07-04 DIAGNOSIS — R413 Other amnesia: Secondary | ICD-10-CM | POA: Diagnosis not present

## 2018-07-04 DIAGNOSIS — E785 Hyperlipidemia, unspecified: Secondary | ICD-10-CM | POA: Diagnosis not present

## 2018-07-06 DIAGNOSIS — G311 Senile degeneration of brain, not elsewhere classified: Secondary | ICD-10-CM | POA: Diagnosis not present

## 2018-07-06 DIAGNOSIS — R413 Other amnesia: Secondary | ICD-10-CM | POA: Diagnosis not present

## 2018-07-06 DIAGNOSIS — F411 Generalized anxiety disorder: Secondary | ICD-10-CM | POA: Diagnosis not present

## 2018-07-06 DIAGNOSIS — Z9010 Acquired absence of unspecified breast and nipple: Secondary | ICD-10-CM | POA: Diagnosis not present

## 2018-07-06 DIAGNOSIS — M069 Rheumatoid arthritis, unspecified: Secondary | ICD-10-CM | POA: Diagnosis not present

## 2018-07-06 DIAGNOSIS — E785 Hyperlipidemia, unspecified: Secondary | ICD-10-CM | POA: Diagnosis not present

## 2018-07-08 DIAGNOSIS — R413 Other amnesia: Secondary | ICD-10-CM | POA: Diagnosis not present

## 2018-07-08 DIAGNOSIS — Z9010 Acquired absence of unspecified breast and nipple: Secondary | ICD-10-CM | POA: Diagnosis not present

## 2018-07-08 DIAGNOSIS — M069 Rheumatoid arthritis, unspecified: Secondary | ICD-10-CM | POA: Diagnosis not present

## 2018-07-08 DIAGNOSIS — G311 Senile degeneration of brain, not elsewhere classified: Secondary | ICD-10-CM | POA: Diagnosis not present

## 2018-07-08 DIAGNOSIS — F411 Generalized anxiety disorder: Secondary | ICD-10-CM | POA: Diagnosis not present

## 2018-07-08 DIAGNOSIS — E785 Hyperlipidemia, unspecified: Secondary | ICD-10-CM | POA: Diagnosis not present

## 2018-07-11 DIAGNOSIS — Z9010 Acquired absence of unspecified breast and nipple: Secondary | ICD-10-CM | POA: Diagnosis not present

## 2018-07-11 DIAGNOSIS — R413 Other amnesia: Secondary | ICD-10-CM | POA: Diagnosis not present

## 2018-07-11 DIAGNOSIS — G311 Senile degeneration of brain, not elsewhere classified: Secondary | ICD-10-CM | POA: Diagnosis not present

## 2018-07-11 DIAGNOSIS — M069 Rheumatoid arthritis, unspecified: Secondary | ICD-10-CM | POA: Diagnosis not present

## 2018-07-11 DIAGNOSIS — F411 Generalized anxiety disorder: Secondary | ICD-10-CM | POA: Diagnosis not present

## 2018-07-11 DIAGNOSIS — E785 Hyperlipidemia, unspecified: Secondary | ICD-10-CM | POA: Diagnosis not present

## 2018-07-13 DIAGNOSIS — M069 Rheumatoid arthritis, unspecified: Secondary | ICD-10-CM | POA: Diagnosis not present

## 2018-07-13 DIAGNOSIS — F411 Generalized anxiety disorder: Secondary | ICD-10-CM | POA: Diagnosis not present

## 2018-07-13 DIAGNOSIS — G311 Senile degeneration of brain, not elsewhere classified: Secondary | ICD-10-CM | POA: Diagnosis not present

## 2018-07-13 DIAGNOSIS — Z9010 Acquired absence of unspecified breast and nipple: Secondary | ICD-10-CM | POA: Diagnosis not present

## 2018-07-13 DIAGNOSIS — E785 Hyperlipidemia, unspecified: Secondary | ICD-10-CM | POA: Diagnosis not present

## 2018-07-13 DIAGNOSIS — R413 Other amnesia: Secondary | ICD-10-CM | POA: Diagnosis not present

## 2018-07-15 DIAGNOSIS — E785 Hyperlipidemia, unspecified: Secondary | ICD-10-CM | POA: Diagnosis not present

## 2018-07-15 DIAGNOSIS — Z9010 Acquired absence of unspecified breast and nipple: Secondary | ICD-10-CM | POA: Diagnosis not present

## 2018-07-15 DIAGNOSIS — F411 Generalized anxiety disorder: Secondary | ICD-10-CM | POA: Diagnosis not present

## 2018-07-15 DIAGNOSIS — M069 Rheumatoid arthritis, unspecified: Secondary | ICD-10-CM | POA: Diagnosis not present

## 2018-07-15 DIAGNOSIS — G311 Senile degeneration of brain, not elsewhere classified: Secondary | ICD-10-CM | POA: Diagnosis not present

## 2018-07-15 DIAGNOSIS — R413 Other amnesia: Secondary | ICD-10-CM | POA: Diagnosis not present

## 2018-07-18 DIAGNOSIS — G311 Senile degeneration of brain, not elsewhere classified: Secondary | ICD-10-CM | POA: Diagnosis not present

## 2018-07-18 DIAGNOSIS — E785 Hyperlipidemia, unspecified: Secondary | ICD-10-CM | POA: Diagnosis not present

## 2018-07-18 DIAGNOSIS — R413 Other amnesia: Secondary | ICD-10-CM | POA: Diagnosis not present

## 2018-07-18 DIAGNOSIS — F411 Generalized anxiety disorder: Secondary | ICD-10-CM | POA: Diagnosis not present

## 2018-07-18 DIAGNOSIS — Z9010 Acquired absence of unspecified breast and nipple: Secondary | ICD-10-CM | POA: Diagnosis not present

## 2018-07-18 DIAGNOSIS — M069 Rheumatoid arthritis, unspecified: Secondary | ICD-10-CM | POA: Diagnosis not present

## 2018-07-20 DIAGNOSIS — E785 Hyperlipidemia, unspecified: Secondary | ICD-10-CM | POA: Diagnosis not present

## 2018-07-20 DIAGNOSIS — F411 Generalized anxiety disorder: Secondary | ICD-10-CM | POA: Diagnosis not present

## 2018-07-20 DIAGNOSIS — R413 Other amnesia: Secondary | ICD-10-CM | POA: Diagnosis not present

## 2018-07-20 DIAGNOSIS — G311 Senile degeneration of brain, not elsewhere classified: Secondary | ICD-10-CM | POA: Diagnosis not present

## 2018-07-20 DIAGNOSIS — M069 Rheumatoid arthritis, unspecified: Secondary | ICD-10-CM | POA: Diagnosis not present

## 2018-07-20 DIAGNOSIS — Z9010 Acquired absence of unspecified breast and nipple: Secondary | ICD-10-CM | POA: Diagnosis not present

## 2018-07-21 DIAGNOSIS — F060 Psychotic disorder with hallucinations due to known physiological condition: Secondary | ICD-10-CM | POA: Diagnosis not present

## 2018-07-21 DIAGNOSIS — I10 Essential (primary) hypertension: Secondary | ICD-10-CM | POA: Diagnosis not present

## 2018-07-21 DIAGNOSIS — E782 Mixed hyperlipidemia: Secondary | ICD-10-CM | POA: Diagnosis not present

## 2018-07-22 DIAGNOSIS — M069 Rheumatoid arthritis, unspecified: Secondary | ICD-10-CM | POA: Diagnosis not present

## 2018-07-22 DIAGNOSIS — Z9010 Acquired absence of unspecified breast and nipple: Secondary | ICD-10-CM | POA: Diagnosis not present

## 2018-07-22 DIAGNOSIS — F411 Generalized anxiety disorder: Secondary | ICD-10-CM | POA: Diagnosis not present

## 2018-07-22 DIAGNOSIS — R413 Other amnesia: Secondary | ICD-10-CM | POA: Diagnosis not present

## 2018-07-22 DIAGNOSIS — G311 Senile degeneration of brain, not elsewhere classified: Secondary | ICD-10-CM | POA: Diagnosis not present

## 2018-07-22 DIAGNOSIS — E785 Hyperlipidemia, unspecified: Secondary | ICD-10-CM | POA: Diagnosis not present

## 2018-07-25 DIAGNOSIS — G311 Senile degeneration of brain, not elsewhere classified: Secondary | ICD-10-CM | POA: Diagnosis not present

## 2018-07-25 DIAGNOSIS — E785 Hyperlipidemia, unspecified: Secondary | ICD-10-CM | POA: Diagnosis not present

## 2018-07-25 DIAGNOSIS — M069 Rheumatoid arthritis, unspecified: Secondary | ICD-10-CM | POA: Diagnosis not present

## 2018-07-25 DIAGNOSIS — F411 Generalized anxiety disorder: Secondary | ICD-10-CM | POA: Diagnosis not present

## 2018-07-25 DIAGNOSIS — Z9010 Acquired absence of unspecified breast and nipple: Secondary | ICD-10-CM | POA: Diagnosis not present

## 2018-07-25 DIAGNOSIS — R413 Other amnesia: Secondary | ICD-10-CM | POA: Diagnosis not present

## 2018-07-27 DIAGNOSIS — M069 Rheumatoid arthritis, unspecified: Secondary | ICD-10-CM | POA: Diagnosis not present

## 2018-07-27 DIAGNOSIS — F411 Generalized anxiety disorder: Secondary | ICD-10-CM | POA: Diagnosis not present

## 2018-07-27 DIAGNOSIS — Z9010 Acquired absence of unspecified breast and nipple: Secondary | ICD-10-CM | POA: Diagnosis not present

## 2018-07-27 DIAGNOSIS — E785 Hyperlipidemia, unspecified: Secondary | ICD-10-CM | POA: Diagnosis not present

## 2018-07-27 DIAGNOSIS — G311 Senile degeneration of brain, not elsewhere classified: Secondary | ICD-10-CM | POA: Diagnosis not present

## 2018-07-27 DIAGNOSIS — R413 Other amnesia: Secondary | ICD-10-CM | POA: Diagnosis not present

## 2018-07-29 DIAGNOSIS — F411 Generalized anxiety disorder: Secondary | ICD-10-CM | POA: Diagnosis not present

## 2018-07-29 DIAGNOSIS — R413 Other amnesia: Secondary | ICD-10-CM | POA: Diagnosis not present

## 2018-07-29 DIAGNOSIS — E785 Hyperlipidemia, unspecified: Secondary | ICD-10-CM | POA: Diagnosis not present

## 2018-07-29 DIAGNOSIS — M069 Rheumatoid arthritis, unspecified: Secondary | ICD-10-CM | POA: Diagnosis not present

## 2018-07-29 DIAGNOSIS — Z9010 Acquired absence of unspecified breast and nipple: Secondary | ICD-10-CM | POA: Diagnosis not present

## 2018-07-29 DIAGNOSIS — G311 Senile degeneration of brain, not elsewhere classified: Secondary | ICD-10-CM | POA: Diagnosis not present

## 2018-08-01 DIAGNOSIS — Z9010 Acquired absence of unspecified breast and nipple: Secondary | ICD-10-CM | POA: Diagnosis not present

## 2018-08-01 DIAGNOSIS — M069 Rheumatoid arthritis, unspecified: Secondary | ICD-10-CM | POA: Diagnosis not present

## 2018-08-01 DIAGNOSIS — G311 Senile degeneration of brain, not elsewhere classified: Secondary | ICD-10-CM | POA: Diagnosis not present

## 2018-08-01 DIAGNOSIS — F411 Generalized anxiety disorder: Secondary | ICD-10-CM | POA: Diagnosis not present

## 2018-08-01 DIAGNOSIS — E785 Hyperlipidemia, unspecified: Secondary | ICD-10-CM | POA: Diagnosis not present

## 2018-08-01 DIAGNOSIS — R413 Other amnesia: Secondary | ICD-10-CM | POA: Diagnosis not present

## 2018-08-03 DIAGNOSIS — M069 Rheumatoid arthritis, unspecified: Secondary | ICD-10-CM | POA: Diagnosis not present

## 2018-08-03 DIAGNOSIS — G311 Senile degeneration of brain, not elsewhere classified: Secondary | ICD-10-CM | POA: Diagnosis not present

## 2018-08-03 DIAGNOSIS — E785 Hyperlipidemia, unspecified: Secondary | ICD-10-CM | POA: Diagnosis not present

## 2018-08-03 DIAGNOSIS — Z9010 Acquired absence of unspecified breast and nipple: Secondary | ICD-10-CM | POA: Diagnosis not present

## 2018-08-03 DIAGNOSIS — F411 Generalized anxiety disorder: Secondary | ICD-10-CM | POA: Diagnosis not present

## 2018-08-03 DIAGNOSIS — R413 Other amnesia: Secondary | ICD-10-CM | POA: Diagnosis not present

## 2018-08-04 DIAGNOSIS — F0150 Vascular dementia without behavioral disturbance: Secondary | ICD-10-CM | POA: Diagnosis not present

## 2018-08-05 DIAGNOSIS — Z9010 Acquired absence of unspecified breast and nipple: Secondary | ICD-10-CM | POA: Diagnosis not present

## 2018-08-05 DIAGNOSIS — E785 Hyperlipidemia, unspecified: Secondary | ICD-10-CM | POA: Diagnosis not present

## 2018-08-05 DIAGNOSIS — R413 Other amnesia: Secondary | ICD-10-CM | POA: Diagnosis not present

## 2018-08-05 DIAGNOSIS — G311 Senile degeneration of brain, not elsewhere classified: Secondary | ICD-10-CM | POA: Diagnosis not present

## 2018-08-05 DIAGNOSIS — F411 Generalized anxiety disorder: Secondary | ICD-10-CM | POA: Diagnosis not present

## 2018-08-05 DIAGNOSIS — M069 Rheumatoid arthritis, unspecified: Secondary | ICD-10-CM | POA: Diagnosis not present

## 2018-08-08 DIAGNOSIS — E785 Hyperlipidemia, unspecified: Secondary | ICD-10-CM | POA: Diagnosis not present

## 2018-08-08 DIAGNOSIS — G311 Senile degeneration of brain, not elsewhere classified: Secondary | ICD-10-CM | POA: Diagnosis not present

## 2018-08-08 DIAGNOSIS — M069 Rheumatoid arthritis, unspecified: Secondary | ICD-10-CM | POA: Diagnosis not present

## 2018-08-08 DIAGNOSIS — F411 Generalized anxiety disorder: Secondary | ICD-10-CM | POA: Diagnosis not present

## 2018-08-08 DIAGNOSIS — R413 Other amnesia: Secondary | ICD-10-CM | POA: Diagnosis not present

## 2018-08-08 DIAGNOSIS — Z9010 Acquired absence of unspecified breast and nipple: Secondary | ICD-10-CM | POA: Diagnosis not present

## 2018-08-09 DIAGNOSIS — M069 Rheumatoid arthritis, unspecified: Secondary | ICD-10-CM | POA: Diagnosis not present

## 2018-08-09 DIAGNOSIS — F411 Generalized anxiety disorder: Secondary | ICD-10-CM | POA: Diagnosis not present

## 2018-08-09 DIAGNOSIS — E785 Hyperlipidemia, unspecified: Secondary | ICD-10-CM | POA: Diagnosis not present

## 2018-08-09 DIAGNOSIS — R413 Other amnesia: Secondary | ICD-10-CM | POA: Diagnosis not present

## 2018-08-09 DIAGNOSIS — G311 Senile degeneration of brain, not elsewhere classified: Secondary | ICD-10-CM | POA: Diagnosis not present

## 2018-08-09 DIAGNOSIS — Z9010 Acquired absence of unspecified breast and nipple: Secondary | ICD-10-CM | POA: Diagnosis not present

## 2018-08-10 DIAGNOSIS — E785 Hyperlipidemia, unspecified: Secondary | ICD-10-CM | POA: Diagnosis not present

## 2018-08-10 DIAGNOSIS — Z9010 Acquired absence of unspecified breast and nipple: Secondary | ICD-10-CM | POA: Diagnosis not present

## 2018-08-10 DIAGNOSIS — M069 Rheumatoid arthritis, unspecified: Secondary | ICD-10-CM | POA: Diagnosis not present

## 2018-08-10 DIAGNOSIS — R413 Other amnesia: Secondary | ICD-10-CM | POA: Diagnosis not present

## 2018-08-10 DIAGNOSIS — F411 Generalized anxiety disorder: Secondary | ICD-10-CM | POA: Diagnosis not present

## 2018-08-10 DIAGNOSIS — G311 Senile degeneration of brain, not elsewhere classified: Secondary | ICD-10-CM | POA: Diagnosis not present

## 2018-08-12 DIAGNOSIS — E785 Hyperlipidemia, unspecified: Secondary | ICD-10-CM | POA: Diagnosis not present

## 2018-08-12 DIAGNOSIS — R413 Other amnesia: Secondary | ICD-10-CM | POA: Diagnosis not present

## 2018-08-12 DIAGNOSIS — M069 Rheumatoid arthritis, unspecified: Secondary | ICD-10-CM | POA: Diagnosis not present

## 2018-08-12 DIAGNOSIS — F411 Generalized anxiety disorder: Secondary | ICD-10-CM | POA: Diagnosis not present

## 2018-08-12 DIAGNOSIS — G311 Senile degeneration of brain, not elsewhere classified: Secondary | ICD-10-CM | POA: Diagnosis not present

## 2018-08-12 DIAGNOSIS — Z9010 Acquired absence of unspecified breast and nipple: Secondary | ICD-10-CM | POA: Diagnosis not present

## 2018-08-15 DIAGNOSIS — F411 Generalized anxiety disorder: Secondary | ICD-10-CM | POA: Diagnosis not present

## 2018-08-15 DIAGNOSIS — M069 Rheumatoid arthritis, unspecified: Secondary | ICD-10-CM | POA: Diagnosis not present

## 2018-08-15 DIAGNOSIS — R413 Other amnesia: Secondary | ICD-10-CM | POA: Diagnosis not present

## 2018-08-15 DIAGNOSIS — Z9010 Acquired absence of unspecified breast and nipple: Secondary | ICD-10-CM | POA: Diagnosis not present

## 2018-08-15 DIAGNOSIS — E785 Hyperlipidemia, unspecified: Secondary | ICD-10-CM | POA: Diagnosis not present

## 2018-08-15 DIAGNOSIS — G311 Senile degeneration of brain, not elsewhere classified: Secondary | ICD-10-CM | POA: Diagnosis not present

## 2018-08-17 DIAGNOSIS — R413 Other amnesia: Secondary | ICD-10-CM | POA: Diagnosis not present

## 2018-08-17 DIAGNOSIS — F411 Generalized anxiety disorder: Secondary | ICD-10-CM | POA: Diagnosis not present

## 2018-08-17 DIAGNOSIS — G311 Senile degeneration of brain, not elsewhere classified: Secondary | ICD-10-CM | POA: Diagnosis not present

## 2018-08-17 DIAGNOSIS — Z9010 Acquired absence of unspecified breast and nipple: Secondary | ICD-10-CM | POA: Diagnosis not present

## 2018-08-17 DIAGNOSIS — E785 Hyperlipidemia, unspecified: Secondary | ICD-10-CM | POA: Diagnosis not present

## 2018-08-17 DIAGNOSIS — M069 Rheumatoid arthritis, unspecified: Secondary | ICD-10-CM | POA: Diagnosis not present

## 2018-08-19 DIAGNOSIS — M069 Rheumatoid arthritis, unspecified: Secondary | ICD-10-CM | POA: Diagnosis not present

## 2018-08-19 DIAGNOSIS — R413 Other amnesia: Secondary | ICD-10-CM | POA: Diagnosis not present

## 2018-08-19 DIAGNOSIS — G311 Senile degeneration of brain, not elsewhere classified: Secondary | ICD-10-CM | POA: Diagnosis not present

## 2018-08-19 DIAGNOSIS — Z9010 Acquired absence of unspecified breast and nipple: Secondary | ICD-10-CM | POA: Diagnosis not present

## 2018-08-19 DIAGNOSIS — F411 Generalized anxiety disorder: Secondary | ICD-10-CM | POA: Diagnosis not present

## 2018-08-19 DIAGNOSIS — E785 Hyperlipidemia, unspecified: Secondary | ICD-10-CM | POA: Diagnosis not present

## 2018-08-22 DIAGNOSIS — G311 Senile degeneration of brain, not elsewhere classified: Secondary | ICD-10-CM | POA: Diagnosis not present

## 2018-08-22 DIAGNOSIS — E785 Hyperlipidemia, unspecified: Secondary | ICD-10-CM | POA: Diagnosis not present

## 2018-08-22 DIAGNOSIS — F411 Generalized anxiety disorder: Secondary | ICD-10-CM | POA: Diagnosis not present

## 2018-08-22 DIAGNOSIS — M069 Rheumatoid arthritis, unspecified: Secondary | ICD-10-CM | POA: Diagnosis not present

## 2018-08-22 DIAGNOSIS — Z9010 Acquired absence of unspecified breast and nipple: Secondary | ICD-10-CM | POA: Diagnosis not present

## 2018-08-22 DIAGNOSIS — R413 Other amnesia: Secondary | ICD-10-CM | POA: Diagnosis not present

## 2018-08-23 DIAGNOSIS — G311 Senile degeneration of brain, not elsewhere classified: Secondary | ICD-10-CM | POA: Diagnosis not present

## 2018-08-23 DIAGNOSIS — M069 Rheumatoid arthritis, unspecified: Secondary | ICD-10-CM | POA: Diagnosis not present

## 2018-08-23 DIAGNOSIS — Z9010 Acquired absence of unspecified breast and nipple: Secondary | ICD-10-CM | POA: Diagnosis not present

## 2018-08-23 DIAGNOSIS — F411 Generalized anxiety disorder: Secondary | ICD-10-CM | POA: Diagnosis not present

## 2018-08-23 DIAGNOSIS — E785 Hyperlipidemia, unspecified: Secondary | ICD-10-CM | POA: Diagnosis not present

## 2018-08-23 DIAGNOSIS — R413 Other amnesia: Secondary | ICD-10-CM | POA: Diagnosis not present

## 2018-08-24 DIAGNOSIS — Z9010 Acquired absence of unspecified breast and nipple: Secondary | ICD-10-CM | POA: Diagnosis not present

## 2018-08-24 DIAGNOSIS — R413 Other amnesia: Secondary | ICD-10-CM | POA: Diagnosis not present

## 2018-08-24 DIAGNOSIS — G311 Senile degeneration of brain, not elsewhere classified: Secondary | ICD-10-CM | POA: Diagnosis not present

## 2018-08-24 DIAGNOSIS — E785 Hyperlipidemia, unspecified: Secondary | ICD-10-CM | POA: Diagnosis not present

## 2018-08-24 DIAGNOSIS — M069 Rheumatoid arthritis, unspecified: Secondary | ICD-10-CM | POA: Diagnosis not present

## 2018-08-24 DIAGNOSIS — F411 Generalized anxiety disorder: Secondary | ICD-10-CM | POA: Diagnosis not present

## 2018-08-25 DIAGNOSIS — G40209 Localization-related (focal) (partial) symptomatic epilepsy and epileptic syndromes with complex partial seizures, not intractable, without status epilepticus: Secondary | ICD-10-CM | POA: Diagnosis not present

## 2018-08-25 DIAGNOSIS — G309 Alzheimer's disease, unspecified: Secondary | ICD-10-CM | POA: Diagnosis not present

## 2018-08-25 DIAGNOSIS — M069 Rheumatoid arthritis, unspecified: Secondary | ICD-10-CM | POA: Diagnosis not present

## 2018-08-25 DIAGNOSIS — Z9010 Acquired absence of unspecified breast and nipple: Secondary | ICD-10-CM | POA: Diagnosis not present

## 2018-08-25 DIAGNOSIS — E785 Hyperlipidemia, unspecified: Secondary | ICD-10-CM | POA: Diagnosis not present

## 2018-08-25 DIAGNOSIS — R739 Hyperglycemia, unspecified: Secondary | ICD-10-CM | POA: Diagnosis not present

## 2018-08-25 DIAGNOSIS — I10 Essential (primary) hypertension: Secondary | ICD-10-CM | POA: Diagnosis not present

## 2018-08-25 DIAGNOSIS — F411 Generalized anxiety disorder: Secondary | ICD-10-CM | POA: Diagnosis not present

## 2018-08-25 DIAGNOSIS — R413 Other amnesia: Secondary | ICD-10-CM | POA: Diagnosis not present

## 2018-08-25 DIAGNOSIS — G311 Senile degeneration of brain, not elsewhere classified: Secondary | ICD-10-CM | POA: Diagnosis not present

## 2018-08-26 DIAGNOSIS — R413 Other amnesia: Secondary | ICD-10-CM | POA: Diagnosis not present

## 2018-08-26 DIAGNOSIS — F411 Generalized anxiety disorder: Secondary | ICD-10-CM | POA: Diagnosis not present

## 2018-08-26 DIAGNOSIS — G311 Senile degeneration of brain, not elsewhere classified: Secondary | ICD-10-CM | POA: Diagnosis not present

## 2018-08-26 DIAGNOSIS — M069 Rheumatoid arthritis, unspecified: Secondary | ICD-10-CM | POA: Diagnosis not present

## 2018-08-26 DIAGNOSIS — Z9010 Acquired absence of unspecified breast and nipple: Secondary | ICD-10-CM | POA: Diagnosis not present

## 2018-08-26 DIAGNOSIS — E785 Hyperlipidemia, unspecified: Secondary | ICD-10-CM | POA: Diagnosis not present

## 2018-08-29 DIAGNOSIS — E785 Hyperlipidemia, unspecified: Secondary | ICD-10-CM | POA: Diagnosis not present

## 2018-08-29 DIAGNOSIS — M069 Rheumatoid arthritis, unspecified: Secondary | ICD-10-CM | POA: Diagnosis not present

## 2018-08-29 DIAGNOSIS — Z9010 Acquired absence of unspecified breast and nipple: Secondary | ICD-10-CM | POA: Diagnosis not present

## 2018-08-29 DIAGNOSIS — R413 Other amnesia: Secondary | ICD-10-CM | POA: Diagnosis not present

## 2018-08-29 DIAGNOSIS — F411 Generalized anxiety disorder: Secondary | ICD-10-CM | POA: Diagnosis not present

## 2018-08-29 DIAGNOSIS — G311 Senile degeneration of brain, not elsewhere classified: Secondary | ICD-10-CM | POA: Diagnosis not present

## 2018-08-31 DIAGNOSIS — G311 Senile degeneration of brain, not elsewhere classified: Secondary | ICD-10-CM | POA: Diagnosis not present

## 2018-08-31 DIAGNOSIS — Z9010 Acquired absence of unspecified breast and nipple: Secondary | ICD-10-CM | POA: Diagnosis not present

## 2018-08-31 DIAGNOSIS — M069 Rheumatoid arthritis, unspecified: Secondary | ICD-10-CM | POA: Diagnosis not present

## 2018-08-31 DIAGNOSIS — F411 Generalized anxiety disorder: Secondary | ICD-10-CM | POA: Diagnosis not present

## 2018-08-31 DIAGNOSIS — R413 Other amnesia: Secondary | ICD-10-CM | POA: Diagnosis not present

## 2018-08-31 DIAGNOSIS — E785 Hyperlipidemia, unspecified: Secondary | ICD-10-CM | POA: Diagnosis not present

## 2018-09-01 DIAGNOSIS — E785 Hyperlipidemia, unspecified: Secondary | ICD-10-CM | POA: Diagnosis not present

## 2018-09-01 DIAGNOSIS — F411 Generalized anxiety disorder: Secondary | ICD-10-CM | POA: Diagnosis not present

## 2018-09-01 DIAGNOSIS — R413 Other amnesia: Secondary | ICD-10-CM | POA: Diagnosis not present

## 2018-09-01 DIAGNOSIS — Z9010 Acquired absence of unspecified breast and nipple: Secondary | ICD-10-CM | POA: Diagnosis not present

## 2018-09-01 DIAGNOSIS — M069 Rheumatoid arthritis, unspecified: Secondary | ICD-10-CM | POA: Diagnosis not present

## 2018-09-01 DIAGNOSIS — G311 Senile degeneration of brain, not elsewhere classified: Secondary | ICD-10-CM | POA: Diagnosis not present

## 2018-09-02 DIAGNOSIS — F411 Generalized anxiety disorder: Secondary | ICD-10-CM | POA: Diagnosis not present

## 2018-09-02 DIAGNOSIS — M069 Rheumatoid arthritis, unspecified: Secondary | ICD-10-CM | POA: Diagnosis not present

## 2018-09-02 DIAGNOSIS — E785 Hyperlipidemia, unspecified: Secondary | ICD-10-CM | POA: Diagnosis not present

## 2018-09-02 DIAGNOSIS — G311 Senile degeneration of brain, not elsewhere classified: Secondary | ICD-10-CM | POA: Diagnosis not present

## 2018-09-02 DIAGNOSIS — Z9010 Acquired absence of unspecified breast and nipple: Secondary | ICD-10-CM | POA: Diagnosis not present

## 2018-09-02 DIAGNOSIS — R413 Other amnesia: Secondary | ICD-10-CM | POA: Diagnosis not present

## 2018-09-03 DIAGNOSIS — F0150 Vascular dementia without behavioral disturbance: Secondary | ICD-10-CM | POA: Diagnosis not present

## 2018-09-05 DIAGNOSIS — Z9010 Acquired absence of unspecified breast and nipple: Secondary | ICD-10-CM | POA: Diagnosis not present

## 2018-09-05 DIAGNOSIS — F411 Generalized anxiety disorder: Secondary | ICD-10-CM | POA: Diagnosis not present

## 2018-09-05 DIAGNOSIS — M069 Rheumatoid arthritis, unspecified: Secondary | ICD-10-CM | POA: Diagnosis not present

## 2018-09-05 DIAGNOSIS — E785 Hyperlipidemia, unspecified: Secondary | ICD-10-CM | POA: Diagnosis not present

## 2018-09-05 DIAGNOSIS — R413 Other amnesia: Secondary | ICD-10-CM | POA: Diagnosis not present

## 2018-09-05 DIAGNOSIS — G311 Senile degeneration of brain, not elsewhere classified: Secondary | ICD-10-CM | POA: Diagnosis not present

## 2018-09-06 DIAGNOSIS — G311 Senile degeneration of brain, not elsewhere classified: Secondary | ICD-10-CM | POA: Diagnosis not present

## 2018-09-06 DIAGNOSIS — R413 Other amnesia: Secondary | ICD-10-CM | POA: Diagnosis not present

## 2018-09-06 DIAGNOSIS — E785 Hyperlipidemia, unspecified: Secondary | ICD-10-CM | POA: Diagnosis not present

## 2018-09-06 DIAGNOSIS — F411 Generalized anxiety disorder: Secondary | ICD-10-CM | POA: Diagnosis not present

## 2018-09-06 DIAGNOSIS — M069 Rheumatoid arthritis, unspecified: Secondary | ICD-10-CM | POA: Diagnosis not present

## 2018-09-06 DIAGNOSIS — Z9010 Acquired absence of unspecified breast and nipple: Secondary | ICD-10-CM | POA: Diagnosis not present

## 2018-09-07 DIAGNOSIS — M069 Rheumatoid arthritis, unspecified: Secondary | ICD-10-CM | POA: Diagnosis not present

## 2018-09-07 DIAGNOSIS — F411 Generalized anxiety disorder: Secondary | ICD-10-CM | POA: Diagnosis not present

## 2018-09-07 DIAGNOSIS — Z9010 Acquired absence of unspecified breast and nipple: Secondary | ICD-10-CM | POA: Diagnosis not present

## 2018-09-07 DIAGNOSIS — G311 Senile degeneration of brain, not elsewhere classified: Secondary | ICD-10-CM | POA: Diagnosis not present

## 2018-09-07 DIAGNOSIS — E785 Hyperlipidemia, unspecified: Secondary | ICD-10-CM | POA: Diagnosis not present

## 2018-09-07 DIAGNOSIS — R413 Other amnesia: Secondary | ICD-10-CM | POA: Diagnosis not present

## 2018-09-09 DIAGNOSIS — M069 Rheumatoid arthritis, unspecified: Secondary | ICD-10-CM | POA: Diagnosis not present

## 2018-09-09 DIAGNOSIS — R413 Other amnesia: Secondary | ICD-10-CM | POA: Diagnosis not present

## 2018-09-09 DIAGNOSIS — Z9010 Acquired absence of unspecified breast and nipple: Secondary | ICD-10-CM | POA: Diagnosis not present

## 2018-09-09 DIAGNOSIS — G311 Senile degeneration of brain, not elsewhere classified: Secondary | ICD-10-CM | POA: Diagnosis not present

## 2018-09-09 DIAGNOSIS — E785 Hyperlipidemia, unspecified: Secondary | ICD-10-CM | POA: Diagnosis not present

## 2018-09-09 DIAGNOSIS — F411 Generalized anxiety disorder: Secondary | ICD-10-CM | POA: Diagnosis not present

## 2018-09-12 DIAGNOSIS — M069 Rheumatoid arthritis, unspecified: Secondary | ICD-10-CM | POA: Diagnosis not present

## 2018-09-12 DIAGNOSIS — E785 Hyperlipidemia, unspecified: Secondary | ICD-10-CM | POA: Diagnosis not present

## 2018-09-12 DIAGNOSIS — Z9010 Acquired absence of unspecified breast and nipple: Secondary | ICD-10-CM | POA: Diagnosis not present

## 2018-09-12 DIAGNOSIS — F411 Generalized anxiety disorder: Secondary | ICD-10-CM | POA: Diagnosis not present

## 2018-09-12 DIAGNOSIS — G311 Senile degeneration of brain, not elsewhere classified: Secondary | ICD-10-CM | POA: Diagnosis not present

## 2018-09-12 DIAGNOSIS — R413 Other amnesia: Secondary | ICD-10-CM | POA: Diagnosis not present

## 2018-09-14 DIAGNOSIS — Z9010 Acquired absence of unspecified breast and nipple: Secondary | ICD-10-CM | POA: Diagnosis not present

## 2018-09-14 DIAGNOSIS — E785 Hyperlipidemia, unspecified: Secondary | ICD-10-CM | POA: Diagnosis not present

## 2018-09-14 DIAGNOSIS — R413 Other amnesia: Secondary | ICD-10-CM | POA: Diagnosis not present

## 2018-09-14 DIAGNOSIS — F411 Generalized anxiety disorder: Secondary | ICD-10-CM | POA: Diagnosis not present

## 2018-09-14 DIAGNOSIS — G311 Senile degeneration of brain, not elsewhere classified: Secondary | ICD-10-CM | POA: Diagnosis not present

## 2018-09-14 DIAGNOSIS — M069 Rheumatoid arthritis, unspecified: Secondary | ICD-10-CM | POA: Diagnosis not present

## 2018-09-16 DIAGNOSIS — M069 Rheumatoid arthritis, unspecified: Secondary | ICD-10-CM | POA: Diagnosis not present

## 2018-09-16 DIAGNOSIS — Z9010 Acquired absence of unspecified breast and nipple: Secondary | ICD-10-CM | POA: Diagnosis not present

## 2018-09-16 DIAGNOSIS — E785 Hyperlipidemia, unspecified: Secondary | ICD-10-CM | POA: Diagnosis not present

## 2018-09-16 DIAGNOSIS — R413 Other amnesia: Secondary | ICD-10-CM | POA: Diagnosis not present

## 2018-09-16 DIAGNOSIS — G311 Senile degeneration of brain, not elsewhere classified: Secondary | ICD-10-CM | POA: Diagnosis not present

## 2018-09-16 DIAGNOSIS — F411 Generalized anxiety disorder: Secondary | ICD-10-CM | POA: Diagnosis not present

## 2018-09-19 DIAGNOSIS — E785 Hyperlipidemia, unspecified: Secondary | ICD-10-CM | POA: Diagnosis not present

## 2018-09-19 DIAGNOSIS — F411 Generalized anxiety disorder: Secondary | ICD-10-CM | POA: Diagnosis not present

## 2018-09-19 DIAGNOSIS — Z9010 Acquired absence of unspecified breast and nipple: Secondary | ICD-10-CM | POA: Diagnosis not present

## 2018-09-19 DIAGNOSIS — M069 Rheumatoid arthritis, unspecified: Secondary | ICD-10-CM | POA: Diagnosis not present

## 2018-09-19 DIAGNOSIS — G311 Senile degeneration of brain, not elsewhere classified: Secondary | ICD-10-CM | POA: Diagnosis not present

## 2018-09-19 DIAGNOSIS — R413 Other amnesia: Secondary | ICD-10-CM | POA: Diagnosis not present

## 2018-09-20 DIAGNOSIS — M069 Rheumatoid arthritis, unspecified: Secondary | ICD-10-CM | POA: Diagnosis not present

## 2018-09-20 DIAGNOSIS — F411 Generalized anxiety disorder: Secondary | ICD-10-CM | POA: Diagnosis not present

## 2018-09-20 DIAGNOSIS — Z9010 Acquired absence of unspecified breast and nipple: Secondary | ICD-10-CM | POA: Diagnosis not present

## 2018-09-20 DIAGNOSIS — R413 Other amnesia: Secondary | ICD-10-CM | POA: Diagnosis not present

## 2018-09-20 DIAGNOSIS — G311 Senile degeneration of brain, not elsewhere classified: Secondary | ICD-10-CM | POA: Diagnosis not present

## 2018-09-20 DIAGNOSIS — E785 Hyperlipidemia, unspecified: Secondary | ICD-10-CM | POA: Diagnosis not present

## 2018-09-21 DIAGNOSIS — G311 Senile degeneration of brain, not elsewhere classified: Secondary | ICD-10-CM | POA: Diagnosis not present

## 2018-09-21 DIAGNOSIS — R413 Other amnesia: Secondary | ICD-10-CM | POA: Diagnosis not present

## 2018-09-21 DIAGNOSIS — Z9010 Acquired absence of unspecified breast and nipple: Secondary | ICD-10-CM | POA: Diagnosis not present

## 2018-09-21 DIAGNOSIS — F411 Generalized anxiety disorder: Secondary | ICD-10-CM | POA: Diagnosis not present

## 2018-09-21 DIAGNOSIS — M069 Rheumatoid arthritis, unspecified: Secondary | ICD-10-CM | POA: Diagnosis not present

## 2018-09-21 DIAGNOSIS — E785 Hyperlipidemia, unspecified: Secondary | ICD-10-CM | POA: Diagnosis not present

## 2018-09-23 DIAGNOSIS — E785 Hyperlipidemia, unspecified: Secondary | ICD-10-CM | POA: Diagnosis not present

## 2018-09-23 DIAGNOSIS — G311 Senile degeneration of brain, not elsewhere classified: Secondary | ICD-10-CM | POA: Diagnosis not present

## 2018-09-23 DIAGNOSIS — Z9010 Acquired absence of unspecified breast and nipple: Secondary | ICD-10-CM | POA: Diagnosis not present

## 2018-09-23 DIAGNOSIS — M069 Rheumatoid arthritis, unspecified: Secondary | ICD-10-CM | POA: Diagnosis not present

## 2018-09-23 DIAGNOSIS — F411 Generalized anxiety disorder: Secondary | ICD-10-CM | POA: Diagnosis not present

## 2018-09-23 DIAGNOSIS — R413 Other amnesia: Secondary | ICD-10-CM | POA: Diagnosis not present

## 2018-09-26 DIAGNOSIS — F411 Generalized anxiety disorder: Secondary | ICD-10-CM | POA: Diagnosis not present

## 2018-09-26 DIAGNOSIS — G311 Senile degeneration of brain, not elsewhere classified: Secondary | ICD-10-CM | POA: Diagnosis not present

## 2018-09-26 DIAGNOSIS — Z9010 Acquired absence of unspecified breast and nipple: Secondary | ICD-10-CM | POA: Diagnosis not present

## 2018-09-26 DIAGNOSIS — E785 Hyperlipidemia, unspecified: Secondary | ICD-10-CM | POA: Diagnosis not present

## 2018-09-26 DIAGNOSIS — R413 Other amnesia: Secondary | ICD-10-CM | POA: Diagnosis not present

## 2018-09-26 DIAGNOSIS — M069 Rheumatoid arthritis, unspecified: Secondary | ICD-10-CM | POA: Diagnosis not present

## 2018-09-28 DIAGNOSIS — F411 Generalized anxiety disorder: Secondary | ICD-10-CM | POA: Diagnosis not present

## 2018-09-28 DIAGNOSIS — R413 Other amnesia: Secondary | ICD-10-CM | POA: Diagnosis not present

## 2018-09-28 DIAGNOSIS — E785 Hyperlipidemia, unspecified: Secondary | ICD-10-CM | POA: Diagnosis not present

## 2018-09-28 DIAGNOSIS — Z9010 Acquired absence of unspecified breast and nipple: Secondary | ICD-10-CM | POA: Diagnosis not present

## 2018-09-28 DIAGNOSIS — M069 Rheumatoid arthritis, unspecified: Secondary | ICD-10-CM | POA: Diagnosis not present

## 2018-09-28 DIAGNOSIS — G311 Senile degeneration of brain, not elsewhere classified: Secondary | ICD-10-CM | POA: Diagnosis not present

## 2018-09-30 DIAGNOSIS — Z9010 Acquired absence of unspecified breast and nipple: Secondary | ICD-10-CM | POA: Diagnosis not present

## 2018-09-30 DIAGNOSIS — F411 Generalized anxiety disorder: Secondary | ICD-10-CM | POA: Diagnosis not present

## 2018-09-30 DIAGNOSIS — M069 Rheumatoid arthritis, unspecified: Secondary | ICD-10-CM | POA: Diagnosis not present

## 2018-09-30 DIAGNOSIS — E785 Hyperlipidemia, unspecified: Secondary | ICD-10-CM | POA: Diagnosis not present

## 2018-09-30 DIAGNOSIS — G311 Senile degeneration of brain, not elsewhere classified: Secondary | ICD-10-CM | POA: Diagnosis not present

## 2018-09-30 DIAGNOSIS — R413 Other amnesia: Secondary | ICD-10-CM | POA: Diagnosis not present

## 2018-10-03 DIAGNOSIS — M069 Rheumatoid arthritis, unspecified: Secondary | ICD-10-CM | POA: Diagnosis not present

## 2018-10-03 DIAGNOSIS — Z9010 Acquired absence of unspecified breast and nipple: Secondary | ICD-10-CM | POA: Diagnosis not present

## 2018-10-03 DIAGNOSIS — F411 Generalized anxiety disorder: Secondary | ICD-10-CM | POA: Diagnosis not present

## 2018-10-03 DIAGNOSIS — G311 Senile degeneration of brain, not elsewhere classified: Secondary | ICD-10-CM | POA: Diagnosis not present

## 2018-10-03 DIAGNOSIS — R413 Other amnesia: Secondary | ICD-10-CM | POA: Diagnosis not present

## 2018-10-03 DIAGNOSIS — E785 Hyperlipidemia, unspecified: Secondary | ICD-10-CM | POA: Diagnosis not present

## 2018-10-04 DIAGNOSIS — Z9010 Acquired absence of unspecified breast and nipple: Secondary | ICD-10-CM | POA: Diagnosis not present

## 2018-10-04 DIAGNOSIS — R413 Other amnesia: Secondary | ICD-10-CM | POA: Diagnosis not present

## 2018-10-04 DIAGNOSIS — E785 Hyperlipidemia, unspecified: Secondary | ICD-10-CM | POA: Diagnosis not present

## 2018-10-04 DIAGNOSIS — G311 Senile degeneration of brain, not elsewhere classified: Secondary | ICD-10-CM | POA: Diagnosis not present

## 2018-10-04 DIAGNOSIS — M069 Rheumatoid arthritis, unspecified: Secondary | ICD-10-CM | POA: Diagnosis not present

## 2018-10-04 DIAGNOSIS — F411 Generalized anxiety disorder: Secondary | ICD-10-CM | POA: Diagnosis not present

## 2018-10-04 DIAGNOSIS — F0150 Vascular dementia without behavioral disturbance: Secondary | ICD-10-CM | POA: Diagnosis not present

## 2018-10-05 DIAGNOSIS — G311 Senile degeneration of brain, not elsewhere classified: Secondary | ICD-10-CM | POA: Diagnosis not present

## 2018-10-05 DIAGNOSIS — F411 Generalized anxiety disorder: Secondary | ICD-10-CM | POA: Diagnosis not present

## 2018-10-05 DIAGNOSIS — E785 Hyperlipidemia, unspecified: Secondary | ICD-10-CM | POA: Diagnosis not present

## 2018-10-05 DIAGNOSIS — R413 Other amnesia: Secondary | ICD-10-CM | POA: Diagnosis not present

## 2018-10-05 DIAGNOSIS — M069 Rheumatoid arthritis, unspecified: Secondary | ICD-10-CM | POA: Diagnosis not present

## 2018-10-05 DIAGNOSIS — Z9010 Acquired absence of unspecified breast and nipple: Secondary | ICD-10-CM | POA: Diagnosis not present

## 2018-10-06 DIAGNOSIS — G309 Alzheimer's disease, unspecified: Secondary | ICD-10-CM | POA: Diagnosis not present

## 2018-10-06 DIAGNOSIS — G40209 Localization-related (focal) (partial) symptomatic epilepsy and epileptic syndromes with complex partial seizures, not intractable, without status epilepticus: Secondary | ICD-10-CM | POA: Diagnosis not present

## 2018-10-06 DIAGNOSIS — I10 Essential (primary) hypertension: Secondary | ICD-10-CM | POA: Diagnosis not present

## 2018-10-10 DIAGNOSIS — M069 Rheumatoid arthritis, unspecified: Secondary | ICD-10-CM | POA: Diagnosis not present

## 2018-10-10 DIAGNOSIS — F411 Generalized anxiety disorder: Secondary | ICD-10-CM | POA: Diagnosis not present

## 2018-10-10 DIAGNOSIS — R413 Other amnesia: Secondary | ICD-10-CM | POA: Diagnosis not present

## 2018-10-10 DIAGNOSIS — Z9010 Acquired absence of unspecified breast and nipple: Secondary | ICD-10-CM | POA: Diagnosis not present

## 2018-10-10 DIAGNOSIS — G311 Senile degeneration of brain, not elsewhere classified: Secondary | ICD-10-CM | POA: Diagnosis not present

## 2018-10-10 DIAGNOSIS — E785 Hyperlipidemia, unspecified: Secondary | ICD-10-CM | POA: Diagnosis not present

## 2018-10-12 DIAGNOSIS — F411 Generalized anxiety disorder: Secondary | ICD-10-CM | POA: Diagnosis not present

## 2018-10-12 DIAGNOSIS — G311 Senile degeneration of brain, not elsewhere classified: Secondary | ICD-10-CM | POA: Diagnosis not present

## 2018-10-12 DIAGNOSIS — M069 Rheumatoid arthritis, unspecified: Secondary | ICD-10-CM | POA: Diagnosis not present

## 2018-10-12 DIAGNOSIS — R413 Other amnesia: Secondary | ICD-10-CM | POA: Diagnosis not present

## 2018-10-12 DIAGNOSIS — Z9010 Acquired absence of unspecified breast and nipple: Secondary | ICD-10-CM | POA: Diagnosis not present

## 2018-10-12 DIAGNOSIS — E785 Hyperlipidemia, unspecified: Secondary | ICD-10-CM | POA: Diagnosis not present

## 2018-10-14 DIAGNOSIS — E785 Hyperlipidemia, unspecified: Secondary | ICD-10-CM | POA: Diagnosis not present

## 2018-10-14 DIAGNOSIS — M069 Rheumatoid arthritis, unspecified: Secondary | ICD-10-CM | POA: Diagnosis not present

## 2018-10-14 DIAGNOSIS — R413 Other amnesia: Secondary | ICD-10-CM | POA: Diagnosis not present

## 2018-10-14 DIAGNOSIS — Z9010 Acquired absence of unspecified breast and nipple: Secondary | ICD-10-CM | POA: Diagnosis not present

## 2018-10-14 DIAGNOSIS — G311 Senile degeneration of brain, not elsewhere classified: Secondary | ICD-10-CM | POA: Diagnosis not present

## 2018-10-14 DIAGNOSIS — F411 Generalized anxiety disorder: Secondary | ICD-10-CM | POA: Diagnosis not present

## 2018-10-17 DIAGNOSIS — M069 Rheumatoid arthritis, unspecified: Secondary | ICD-10-CM | POA: Diagnosis not present

## 2018-10-17 DIAGNOSIS — E785 Hyperlipidemia, unspecified: Secondary | ICD-10-CM | POA: Diagnosis not present

## 2018-10-17 DIAGNOSIS — F411 Generalized anxiety disorder: Secondary | ICD-10-CM | POA: Diagnosis not present

## 2018-10-17 DIAGNOSIS — G311 Senile degeneration of brain, not elsewhere classified: Secondary | ICD-10-CM | POA: Diagnosis not present

## 2018-10-17 DIAGNOSIS — R413 Other amnesia: Secondary | ICD-10-CM | POA: Diagnosis not present

## 2018-10-17 DIAGNOSIS — Z9010 Acquired absence of unspecified breast and nipple: Secondary | ICD-10-CM | POA: Diagnosis not present

## 2018-10-18 DIAGNOSIS — E785 Hyperlipidemia, unspecified: Secondary | ICD-10-CM | POA: Diagnosis not present

## 2018-10-18 DIAGNOSIS — G311 Senile degeneration of brain, not elsewhere classified: Secondary | ICD-10-CM | POA: Diagnosis not present

## 2018-10-18 DIAGNOSIS — M069 Rheumatoid arthritis, unspecified: Secondary | ICD-10-CM | POA: Diagnosis not present

## 2018-10-18 DIAGNOSIS — R413 Other amnesia: Secondary | ICD-10-CM | POA: Diagnosis not present

## 2018-10-18 DIAGNOSIS — F411 Generalized anxiety disorder: Secondary | ICD-10-CM | POA: Diagnosis not present

## 2018-10-18 DIAGNOSIS — Z9010 Acquired absence of unspecified breast and nipple: Secondary | ICD-10-CM | POA: Diagnosis not present

## 2018-10-19 DIAGNOSIS — E785 Hyperlipidemia, unspecified: Secondary | ICD-10-CM | POA: Diagnosis not present

## 2018-10-19 DIAGNOSIS — G311 Senile degeneration of brain, not elsewhere classified: Secondary | ICD-10-CM | POA: Diagnosis not present

## 2018-10-19 DIAGNOSIS — M069 Rheumatoid arthritis, unspecified: Secondary | ICD-10-CM | POA: Diagnosis not present

## 2018-10-19 DIAGNOSIS — R413 Other amnesia: Secondary | ICD-10-CM | POA: Diagnosis not present

## 2018-10-19 DIAGNOSIS — Z9010 Acquired absence of unspecified breast and nipple: Secondary | ICD-10-CM | POA: Diagnosis not present

## 2018-10-19 DIAGNOSIS — F411 Generalized anxiety disorder: Secondary | ICD-10-CM | POA: Diagnosis not present

## 2018-10-20 ENCOUNTER — Other Ambulatory Visit (HOSPITAL_COMMUNITY): Payer: Self-pay | Admitting: Family Medicine

## 2018-10-20 DIAGNOSIS — R928 Other abnormal and inconclusive findings on diagnostic imaging of breast: Secondary | ICD-10-CM

## 2018-10-21 DIAGNOSIS — E785 Hyperlipidemia, unspecified: Secondary | ICD-10-CM | POA: Diagnosis not present

## 2018-10-21 DIAGNOSIS — G311 Senile degeneration of brain, not elsewhere classified: Secondary | ICD-10-CM | POA: Diagnosis not present

## 2018-10-21 DIAGNOSIS — Z9010 Acquired absence of unspecified breast and nipple: Secondary | ICD-10-CM | POA: Diagnosis not present

## 2018-10-21 DIAGNOSIS — M069 Rheumatoid arthritis, unspecified: Secondary | ICD-10-CM | POA: Diagnosis not present

## 2018-10-21 DIAGNOSIS — R413 Other amnesia: Secondary | ICD-10-CM | POA: Diagnosis not present

## 2018-10-21 DIAGNOSIS — F411 Generalized anxiety disorder: Secondary | ICD-10-CM | POA: Diagnosis not present

## 2018-10-24 DIAGNOSIS — M069 Rheumatoid arthritis, unspecified: Secondary | ICD-10-CM | POA: Diagnosis not present

## 2018-10-24 DIAGNOSIS — F411 Generalized anxiety disorder: Secondary | ICD-10-CM | POA: Diagnosis not present

## 2018-10-24 DIAGNOSIS — R413 Other amnesia: Secondary | ICD-10-CM | POA: Diagnosis not present

## 2018-10-24 DIAGNOSIS — G311 Senile degeneration of brain, not elsewhere classified: Secondary | ICD-10-CM | POA: Diagnosis not present

## 2018-10-24 DIAGNOSIS — Z9010 Acquired absence of unspecified breast and nipple: Secondary | ICD-10-CM | POA: Diagnosis not present

## 2018-10-24 DIAGNOSIS — E785 Hyperlipidemia, unspecified: Secondary | ICD-10-CM | POA: Diagnosis not present

## 2018-10-25 DIAGNOSIS — Z9010 Acquired absence of unspecified breast and nipple: Secondary | ICD-10-CM | POA: Diagnosis not present

## 2018-10-25 DIAGNOSIS — R413 Other amnesia: Secondary | ICD-10-CM | POA: Diagnosis not present

## 2018-10-25 DIAGNOSIS — G311 Senile degeneration of brain, not elsewhere classified: Secondary | ICD-10-CM | POA: Diagnosis not present

## 2018-10-25 DIAGNOSIS — M069 Rheumatoid arthritis, unspecified: Secondary | ICD-10-CM | POA: Diagnosis not present

## 2018-10-25 DIAGNOSIS — E785 Hyperlipidemia, unspecified: Secondary | ICD-10-CM | POA: Diagnosis not present

## 2018-10-25 DIAGNOSIS — F411 Generalized anxiety disorder: Secondary | ICD-10-CM | POA: Diagnosis not present

## 2018-10-26 DIAGNOSIS — E785 Hyperlipidemia, unspecified: Secondary | ICD-10-CM | POA: Diagnosis not present

## 2018-10-26 DIAGNOSIS — M069 Rheumatoid arthritis, unspecified: Secondary | ICD-10-CM | POA: Diagnosis not present

## 2018-10-26 DIAGNOSIS — Z9010 Acquired absence of unspecified breast and nipple: Secondary | ICD-10-CM | POA: Diagnosis not present

## 2018-10-26 DIAGNOSIS — F411 Generalized anxiety disorder: Secondary | ICD-10-CM | POA: Diagnosis not present

## 2018-10-26 DIAGNOSIS — R413 Other amnesia: Secondary | ICD-10-CM | POA: Diagnosis not present

## 2018-10-26 DIAGNOSIS — G311 Senile degeneration of brain, not elsewhere classified: Secondary | ICD-10-CM | POA: Diagnosis not present

## 2018-10-28 DIAGNOSIS — G311 Senile degeneration of brain, not elsewhere classified: Secondary | ICD-10-CM | POA: Diagnosis not present

## 2018-10-28 DIAGNOSIS — E785 Hyperlipidemia, unspecified: Secondary | ICD-10-CM | POA: Diagnosis not present

## 2018-10-28 DIAGNOSIS — Z9010 Acquired absence of unspecified breast and nipple: Secondary | ICD-10-CM | POA: Diagnosis not present

## 2018-10-28 DIAGNOSIS — F411 Generalized anxiety disorder: Secondary | ICD-10-CM | POA: Diagnosis not present

## 2018-10-28 DIAGNOSIS — R413 Other amnesia: Secondary | ICD-10-CM | POA: Diagnosis not present

## 2018-10-28 DIAGNOSIS — M069 Rheumatoid arthritis, unspecified: Secondary | ICD-10-CM | POA: Diagnosis not present

## 2018-10-31 DIAGNOSIS — G311 Senile degeneration of brain, not elsewhere classified: Secondary | ICD-10-CM | POA: Diagnosis not present

## 2018-10-31 DIAGNOSIS — M069 Rheumatoid arthritis, unspecified: Secondary | ICD-10-CM | POA: Diagnosis not present

## 2018-10-31 DIAGNOSIS — E785 Hyperlipidemia, unspecified: Secondary | ICD-10-CM | POA: Diagnosis not present

## 2018-10-31 DIAGNOSIS — R413 Other amnesia: Secondary | ICD-10-CM | POA: Diagnosis not present

## 2018-10-31 DIAGNOSIS — Z9010 Acquired absence of unspecified breast and nipple: Secondary | ICD-10-CM | POA: Diagnosis not present

## 2018-10-31 DIAGNOSIS — F411 Generalized anxiety disorder: Secondary | ICD-10-CM | POA: Diagnosis not present

## 2018-11-01 DIAGNOSIS — M069 Rheumatoid arthritis, unspecified: Secondary | ICD-10-CM | POA: Diagnosis not present

## 2018-11-01 DIAGNOSIS — Z9010 Acquired absence of unspecified breast and nipple: Secondary | ICD-10-CM | POA: Diagnosis not present

## 2018-11-01 DIAGNOSIS — G311 Senile degeneration of brain, not elsewhere classified: Secondary | ICD-10-CM | POA: Diagnosis not present

## 2018-11-01 DIAGNOSIS — E785 Hyperlipidemia, unspecified: Secondary | ICD-10-CM | POA: Diagnosis not present

## 2018-11-01 DIAGNOSIS — F411 Generalized anxiety disorder: Secondary | ICD-10-CM | POA: Diagnosis not present

## 2018-11-01 DIAGNOSIS — R413 Other amnesia: Secondary | ICD-10-CM | POA: Diagnosis not present

## 2018-11-02 DIAGNOSIS — G311 Senile degeneration of brain, not elsewhere classified: Secondary | ICD-10-CM | POA: Diagnosis not present

## 2018-11-02 DIAGNOSIS — E785 Hyperlipidemia, unspecified: Secondary | ICD-10-CM | POA: Diagnosis not present

## 2018-11-02 DIAGNOSIS — Z9010 Acquired absence of unspecified breast and nipple: Secondary | ICD-10-CM | POA: Diagnosis not present

## 2018-11-02 DIAGNOSIS — M069 Rheumatoid arthritis, unspecified: Secondary | ICD-10-CM | POA: Diagnosis not present

## 2018-11-02 DIAGNOSIS — R413 Other amnesia: Secondary | ICD-10-CM | POA: Diagnosis not present

## 2018-11-02 DIAGNOSIS — F411 Generalized anxiety disorder: Secondary | ICD-10-CM | POA: Diagnosis not present

## 2018-11-03 DIAGNOSIS — F0150 Vascular dementia without behavioral disturbance: Secondary | ICD-10-CM | POA: Diagnosis not present

## 2018-11-04 DIAGNOSIS — R413 Other amnesia: Secondary | ICD-10-CM | POA: Diagnosis not present

## 2018-11-04 DIAGNOSIS — G311 Senile degeneration of brain, not elsewhere classified: Secondary | ICD-10-CM | POA: Diagnosis not present

## 2018-11-04 DIAGNOSIS — F411 Generalized anxiety disorder: Secondary | ICD-10-CM | POA: Diagnosis not present

## 2018-11-04 DIAGNOSIS — M069 Rheumatoid arthritis, unspecified: Secondary | ICD-10-CM | POA: Diagnosis not present

## 2018-11-04 DIAGNOSIS — Z9010 Acquired absence of unspecified breast and nipple: Secondary | ICD-10-CM | POA: Diagnosis not present

## 2018-11-04 DIAGNOSIS — E785 Hyperlipidemia, unspecified: Secondary | ICD-10-CM | POA: Diagnosis not present

## 2018-11-05 DIAGNOSIS — R413 Other amnesia: Secondary | ICD-10-CM | POA: Diagnosis not present

## 2018-11-05 DIAGNOSIS — M069 Rheumatoid arthritis, unspecified: Secondary | ICD-10-CM | POA: Diagnosis not present

## 2018-11-05 DIAGNOSIS — G311 Senile degeneration of brain, not elsewhere classified: Secondary | ICD-10-CM | POA: Diagnosis not present

## 2018-11-05 DIAGNOSIS — E785 Hyperlipidemia, unspecified: Secondary | ICD-10-CM | POA: Diagnosis not present

## 2018-11-05 DIAGNOSIS — Z9010 Acquired absence of unspecified breast and nipple: Secondary | ICD-10-CM | POA: Diagnosis not present

## 2018-11-05 DIAGNOSIS — F411 Generalized anxiety disorder: Secondary | ICD-10-CM | POA: Diagnosis not present

## 2018-11-07 DIAGNOSIS — G311 Senile degeneration of brain, not elsewhere classified: Secondary | ICD-10-CM | POA: Diagnosis not present

## 2018-11-07 DIAGNOSIS — Z9010 Acquired absence of unspecified breast and nipple: Secondary | ICD-10-CM | POA: Diagnosis not present

## 2018-11-07 DIAGNOSIS — R413 Other amnesia: Secondary | ICD-10-CM | POA: Diagnosis not present

## 2018-11-07 DIAGNOSIS — F411 Generalized anxiety disorder: Secondary | ICD-10-CM | POA: Diagnosis not present

## 2018-11-07 DIAGNOSIS — M069 Rheumatoid arthritis, unspecified: Secondary | ICD-10-CM | POA: Diagnosis not present

## 2018-11-07 DIAGNOSIS — E785 Hyperlipidemia, unspecified: Secondary | ICD-10-CM | POA: Diagnosis not present

## 2018-11-09 DIAGNOSIS — E785 Hyperlipidemia, unspecified: Secondary | ICD-10-CM | POA: Diagnosis not present

## 2018-11-09 DIAGNOSIS — G311 Senile degeneration of brain, not elsewhere classified: Secondary | ICD-10-CM | POA: Diagnosis not present

## 2018-11-09 DIAGNOSIS — F411 Generalized anxiety disorder: Secondary | ICD-10-CM | POA: Diagnosis not present

## 2018-11-09 DIAGNOSIS — M069 Rheumatoid arthritis, unspecified: Secondary | ICD-10-CM | POA: Diagnosis not present

## 2018-11-09 DIAGNOSIS — Z9010 Acquired absence of unspecified breast and nipple: Secondary | ICD-10-CM | POA: Diagnosis not present

## 2018-11-09 DIAGNOSIS — R413 Other amnesia: Secondary | ICD-10-CM | POA: Diagnosis not present

## 2018-11-11 DIAGNOSIS — G311 Senile degeneration of brain, not elsewhere classified: Secondary | ICD-10-CM | POA: Diagnosis not present

## 2018-11-11 DIAGNOSIS — F411 Generalized anxiety disorder: Secondary | ICD-10-CM | POA: Diagnosis not present

## 2018-11-11 DIAGNOSIS — M069 Rheumatoid arthritis, unspecified: Secondary | ICD-10-CM | POA: Diagnosis not present

## 2018-11-11 DIAGNOSIS — Z9010 Acquired absence of unspecified breast and nipple: Secondary | ICD-10-CM | POA: Diagnosis not present

## 2018-11-11 DIAGNOSIS — R413 Other amnesia: Secondary | ICD-10-CM | POA: Diagnosis not present

## 2018-11-11 DIAGNOSIS — E785 Hyperlipidemia, unspecified: Secondary | ICD-10-CM | POA: Diagnosis not present

## 2018-11-14 DIAGNOSIS — M069 Rheumatoid arthritis, unspecified: Secondary | ICD-10-CM | POA: Diagnosis not present

## 2018-11-14 DIAGNOSIS — R413 Other amnesia: Secondary | ICD-10-CM | POA: Diagnosis not present

## 2018-11-14 DIAGNOSIS — G311 Senile degeneration of brain, not elsewhere classified: Secondary | ICD-10-CM | POA: Diagnosis not present

## 2018-11-14 DIAGNOSIS — Z9010 Acquired absence of unspecified breast and nipple: Secondary | ICD-10-CM | POA: Diagnosis not present

## 2018-11-14 DIAGNOSIS — E785 Hyperlipidemia, unspecified: Secondary | ICD-10-CM | POA: Diagnosis not present

## 2018-11-14 DIAGNOSIS — F411 Generalized anxiety disorder: Secondary | ICD-10-CM | POA: Diagnosis not present

## 2018-11-15 DIAGNOSIS — E785 Hyperlipidemia, unspecified: Secondary | ICD-10-CM | POA: Diagnosis not present

## 2018-11-15 DIAGNOSIS — F411 Generalized anxiety disorder: Secondary | ICD-10-CM | POA: Diagnosis not present

## 2018-11-15 DIAGNOSIS — R413 Other amnesia: Secondary | ICD-10-CM | POA: Diagnosis not present

## 2018-11-15 DIAGNOSIS — G311 Senile degeneration of brain, not elsewhere classified: Secondary | ICD-10-CM | POA: Diagnosis not present

## 2018-11-15 DIAGNOSIS — M069 Rheumatoid arthritis, unspecified: Secondary | ICD-10-CM | POA: Diagnosis not present

## 2018-11-15 DIAGNOSIS — Z9010 Acquired absence of unspecified breast and nipple: Secondary | ICD-10-CM | POA: Diagnosis not present

## 2018-11-16 DIAGNOSIS — F411 Generalized anxiety disorder: Secondary | ICD-10-CM | POA: Diagnosis not present

## 2018-11-16 DIAGNOSIS — G311 Senile degeneration of brain, not elsewhere classified: Secondary | ICD-10-CM | POA: Diagnosis not present

## 2018-11-16 DIAGNOSIS — Z9010 Acquired absence of unspecified breast and nipple: Secondary | ICD-10-CM | POA: Diagnosis not present

## 2018-11-16 DIAGNOSIS — R413 Other amnesia: Secondary | ICD-10-CM | POA: Diagnosis not present

## 2018-11-16 DIAGNOSIS — E785 Hyperlipidemia, unspecified: Secondary | ICD-10-CM | POA: Diagnosis not present

## 2018-11-16 DIAGNOSIS — M069 Rheumatoid arthritis, unspecified: Secondary | ICD-10-CM | POA: Diagnosis not present

## 2018-11-18 DIAGNOSIS — G311 Senile degeneration of brain, not elsewhere classified: Secondary | ICD-10-CM | POA: Diagnosis not present

## 2018-11-18 DIAGNOSIS — R413 Other amnesia: Secondary | ICD-10-CM | POA: Diagnosis not present

## 2018-11-18 DIAGNOSIS — E785 Hyperlipidemia, unspecified: Secondary | ICD-10-CM | POA: Diagnosis not present

## 2018-11-18 DIAGNOSIS — Z9010 Acquired absence of unspecified breast and nipple: Secondary | ICD-10-CM | POA: Diagnosis not present

## 2018-11-18 DIAGNOSIS — F411 Generalized anxiety disorder: Secondary | ICD-10-CM | POA: Diagnosis not present

## 2018-11-18 DIAGNOSIS — M069 Rheumatoid arthritis, unspecified: Secondary | ICD-10-CM | POA: Diagnosis not present

## 2018-11-21 DIAGNOSIS — E785 Hyperlipidemia, unspecified: Secondary | ICD-10-CM | POA: Diagnosis not present

## 2018-11-21 DIAGNOSIS — F411 Generalized anxiety disorder: Secondary | ICD-10-CM | POA: Diagnosis not present

## 2018-11-21 DIAGNOSIS — M069 Rheumatoid arthritis, unspecified: Secondary | ICD-10-CM | POA: Diagnosis not present

## 2018-11-21 DIAGNOSIS — Z9010 Acquired absence of unspecified breast and nipple: Secondary | ICD-10-CM | POA: Diagnosis not present

## 2018-11-21 DIAGNOSIS — R413 Other amnesia: Secondary | ICD-10-CM | POA: Diagnosis not present

## 2018-11-21 DIAGNOSIS — G311 Senile degeneration of brain, not elsewhere classified: Secondary | ICD-10-CM | POA: Diagnosis not present

## 2018-11-22 DIAGNOSIS — G311 Senile degeneration of brain, not elsewhere classified: Secondary | ICD-10-CM | POA: Diagnosis not present

## 2018-11-22 DIAGNOSIS — E785 Hyperlipidemia, unspecified: Secondary | ICD-10-CM | POA: Diagnosis not present

## 2018-11-22 DIAGNOSIS — F411 Generalized anxiety disorder: Secondary | ICD-10-CM | POA: Diagnosis not present

## 2018-11-22 DIAGNOSIS — Z9010 Acquired absence of unspecified breast and nipple: Secondary | ICD-10-CM | POA: Diagnosis not present

## 2018-11-22 DIAGNOSIS — R413 Other amnesia: Secondary | ICD-10-CM | POA: Diagnosis not present

## 2018-11-22 DIAGNOSIS — M069 Rheumatoid arthritis, unspecified: Secondary | ICD-10-CM | POA: Diagnosis not present

## 2018-11-23 DIAGNOSIS — F411 Generalized anxiety disorder: Secondary | ICD-10-CM | POA: Diagnosis not present

## 2018-11-23 DIAGNOSIS — E785 Hyperlipidemia, unspecified: Secondary | ICD-10-CM | POA: Diagnosis not present

## 2018-11-23 DIAGNOSIS — G311 Senile degeneration of brain, not elsewhere classified: Secondary | ICD-10-CM | POA: Diagnosis not present

## 2018-11-23 DIAGNOSIS — Z9010 Acquired absence of unspecified breast and nipple: Secondary | ICD-10-CM | POA: Diagnosis not present

## 2018-11-23 DIAGNOSIS — M069 Rheumatoid arthritis, unspecified: Secondary | ICD-10-CM | POA: Diagnosis not present

## 2018-11-23 DIAGNOSIS — R413 Other amnesia: Secondary | ICD-10-CM | POA: Diagnosis not present

## 2018-11-25 DIAGNOSIS — E785 Hyperlipidemia, unspecified: Secondary | ICD-10-CM | POA: Diagnosis not present

## 2018-11-25 DIAGNOSIS — Z9010 Acquired absence of unspecified breast and nipple: Secondary | ICD-10-CM | POA: Diagnosis not present

## 2018-11-25 DIAGNOSIS — G311 Senile degeneration of brain, not elsewhere classified: Secondary | ICD-10-CM | POA: Diagnosis not present

## 2018-11-25 DIAGNOSIS — R413 Other amnesia: Secondary | ICD-10-CM | POA: Diagnosis not present

## 2018-11-25 DIAGNOSIS — F411 Generalized anxiety disorder: Secondary | ICD-10-CM | POA: Diagnosis not present

## 2018-11-25 DIAGNOSIS — M069 Rheumatoid arthritis, unspecified: Secondary | ICD-10-CM | POA: Diagnosis not present

## 2018-11-28 DIAGNOSIS — Z9010 Acquired absence of unspecified breast and nipple: Secondary | ICD-10-CM | POA: Diagnosis not present

## 2018-11-28 DIAGNOSIS — R413 Other amnesia: Secondary | ICD-10-CM | POA: Diagnosis not present

## 2018-11-28 DIAGNOSIS — F411 Generalized anxiety disorder: Secondary | ICD-10-CM | POA: Diagnosis not present

## 2018-11-28 DIAGNOSIS — G311 Senile degeneration of brain, not elsewhere classified: Secondary | ICD-10-CM | POA: Diagnosis not present

## 2018-11-28 DIAGNOSIS — E785 Hyperlipidemia, unspecified: Secondary | ICD-10-CM | POA: Diagnosis not present

## 2018-11-28 DIAGNOSIS — M069 Rheumatoid arthritis, unspecified: Secondary | ICD-10-CM | POA: Diagnosis not present

## 2018-11-29 ENCOUNTER — Ambulatory Visit (HOSPITAL_COMMUNITY)
Admission: RE | Admit: 2018-11-29 | Discharge: 2018-11-29 | Disposition: A | Discharge status: Home / Self Care | Payer: Medicare HMO | Source: Ambulatory Visit | Attending: Family Medicine | Admitting: Family Medicine

## 2018-11-29 ENCOUNTER — Other Ambulatory Visit: Payer: Self-pay

## 2018-11-29 ENCOUNTER — Ambulatory Visit (HOSPITAL_COMMUNITY): Admission: RE | Admit: 2018-11-29 | Payer: Medicare HMO | Source: Ambulatory Visit

## 2018-11-29 ENCOUNTER — Encounter (HOSPITAL_COMMUNITY): Payer: Self-pay

## 2018-11-29 DIAGNOSIS — R928 Other abnormal and inconclusive findings on diagnostic imaging of breast: Secondary | ICD-10-CM

## 2018-11-29 NOTE — Progress Notes (Signed)
Pt is unable to tolerate Mammogram due to dementia.  Pt had support person with her but she was unable to tolerate exam per [facility]

## 2018-11-30 DIAGNOSIS — E785 Hyperlipidemia, unspecified: Secondary | ICD-10-CM | POA: Diagnosis not present

## 2018-11-30 DIAGNOSIS — Z9010 Acquired absence of unspecified breast and nipple: Secondary | ICD-10-CM | POA: Diagnosis not present

## 2018-11-30 DIAGNOSIS — F411 Generalized anxiety disorder: Secondary | ICD-10-CM | POA: Diagnosis not present

## 2018-11-30 DIAGNOSIS — M069 Rheumatoid arthritis, unspecified: Secondary | ICD-10-CM | POA: Diagnosis not present

## 2018-11-30 DIAGNOSIS — G311 Senile degeneration of brain, not elsewhere classified: Secondary | ICD-10-CM | POA: Diagnosis not present

## 2018-11-30 DIAGNOSIS — R413 Other amnesia: Secondary | ICD-10-CM | POA: Diagnosis not present

## 2018-12-02 DIAGNOSIS — E785 Hyperlipidemia, unspecified: Secondary | ICD-10-CM | POA: Diagnosis not present

## 2018-12-02 DIAGNOSIS — M069 Rheumatoid arthritis, unspecified: Secondary | ICD-10-CM | POA: Diagnosis not present

## 2018-12-02 DIAGNOSIS — R413 Other amnesia: Secondary | ICD-10-CM | POA: Diagnosis not present

## 2018-12-02 DIAGNOSIS — G311 Senile degeneration of brain, not elsewhere classified: Secondary | ICD-10-CM | POA: Diagnosis not present

## 2018-12-02 DIAGNOSIS — F411 Generalized anxiety disorder: Secondary | ICD-10-CM | POA: Diagnosis not present

## 2018-12-02 DIAGNOSIS — Z9010 Acquired absence of unspecified breast and nipple: Secondary | ICD-10-CM | POA: Diagnosis not present

## 2018-12-04 DIAGNOSIS — F0150 Vascular dementia without behavioral disturbance: Secondary | ICD-10-CM | POA: Diagnosis not present

## 2018-12-05 DIAGNOSIS — G311 Senile degeneration of brain, not elsewhere classified: Secondary | ICD-10-CM | POA: Diagnosis not present

## 2018-12-05 DIAGNOSIS — R413 Other amnesia: Secondary | ICD-10-CM | POA: Diagnosis not present

## 2018-12-05 DIAGNOSIS — M069 Rheumatoid arthritis, unspecified: Secondary | ICD-10-CM | POA: Diagnosis not present

## 2018-12-05 DIAGNOSIS — Z9010 Acquired absence of unspecified breast and nipple: Secondary | ICD-10-CM | POA: Diagnosis not present

## 2018-12-05 DIAGNOSIS — F411 Generalized anxiety disorder: Secondary | ICD-10-CM | POA: Diagnosis not present

## 2018-12-05 DIAGNOSIS — E785 Hyperlipidemia, unspecified: Secondary | ICD-10-CM | POA: Diagnosis not present

## 2018-12-06 DIAGNOSIS — F411 Generalized anxiety disorder: Secondary | ICD-10-CM | POA: Diagnosis not present

## 2018-12-06 DIAGNOSIS — E785 Hyperlipidemia, unspecified: Secondary | ICD-10-CM | POA: Diagnosis not present

## 2018-12-06 DIAGNOSIS — R413 Other amnesia: Secondary | ICD-10-CM | POA: Diagnosis not present

## 2018-12-06 DIAGNOSIS — G311 Senile degeneration of brain, not elsewhere classified: Secondary | ICD-10-CM | POA: Diagnosis not present

## 2018-12-06 DIAGNOSIS — M069 Rheumatoid arthritis, unspecified: Secondary | ICD-10-CM | POA: Diagnosis not present

## 2018-12-06 DIAGNOSIS — Z9010 Acquired absence of unspecified breast and nipple: Secondary | ICD-10-CM | POA: Diagnosis not present

## 2018-12-07 DIAGNOSIS — E785 Hyperlipidemia, unspecified: Secondary | ICD-10-CM | POA: Diagnosis not present

## 2018-12-07 DIAGNOSIS — Z0000 Encounter for general adult medical examination without abnormal findings: Secondary | ICD-10-CM | POA: Diagnosis not present

## 2018-12-07 DIAGNOSIS — M069 Rheumatoid arthritis, unspecified: Secondary | ICD-10-CM | POA: Diagnosis not present

## 2018-12-07 DIAGNOSIS — G309 Alzheimer's disease, unspecified: Secondary | ICD-10-CM | POA: Diagnosis not present

## 2018-12-07 DIAGNOSIS — Z9010 Acquired absence of unspecified breast and nipple: Secondary | ICD-10-CM | POA: Diagnosis not present

## 2018-12-07 DIAGNOSIS — F411 Generalized anxiety disorder: Secondary | ICD-10-CM | POA: Diagnosis not present

## 2018-12-07 DIAGNOSIS — R413 Other amnesia: Secondary | ICD-10-CM | POA: Diagnosis not present

## 2018-12-07 DIAGNOSIS — R739 Hyperglycemia, unspecified: Secondary | ICD-10-CM | POA: Diagnosis not present

## 2018-12-07 DIAGNOSIS — G311 Senile degeneration of brain, not elsewhere classified: Secondary | ICD-10-CM | POA: Diagnosis not present

## 2018-12-07 DIAGNOSIS — I10 Essential (primary) hypertension: Secondary | ICD-10-CM | POA: Diagnosis not present

## 2018-12-08 DIAGNOSIS — H40013 Open angle with borderline findings, low risk, bilateral: Secondary | ICD-10-CM | POA: Diagnosis not present

## 2018-12-08 DIAGNOSIS — H5203 Hypermetropia, bilateral: Secondary | ICD-10-CM | POA: Diagnosis not present

## 2018-12-08 DIAGNOSIS — Z961 Presence of intraocular lens: Secondary | ICD-10-CM | POA: Diagnosis not present

## 2018-12-08 DIAGNOSIS — H40053 Ocular hypertension, bilateral: Secondary | ICD-10-CM | POA: Diagnosis not present

## 2018-12-08 DIAGNOSIS — H18413 Arcus senilis, bilateral: Secondary | ICD-10-CM | POA: Diagnosis not present

## 2018-12-08 DIAGNOSIS — H524 Presbyopia: Secondary | ICD-10-CM | POA: Diagnosis not present

## 2018-12-08 DIAGNOSIS — H2513 Age-related nuclear cataract, bilateral: Secondary | ICD-10-CM | POA: Diagnosis not present

## 2018-12-08 DIAGNOSIS — H52223 Regular astigmatism, bilateral: Secondary | ICD-10-CM | POA: Diagnosis not present

## 2018-12-09 DIAGNOSIS — E785 Hyperlipidemia, unspecified: Secondary | ICD-10-CM | POA: Diagnosis not present

## 2018-12-09 DIAGNOSIS — F411 Generalized anxiety disorder: Secondary | ICD-10-CM | POA: Diagnosis not present

## 2018-12-09 DIAGNOSIS — M069 Rheumatoid arthritis, unspecified: Secondary | ICD-10-CM | POA: Diagnosis not present

## 2018-12-09 DIAGNOSIS — Z9010 Acquired absence of unspecified breast and nipple: Secondary | ICD-10-CM | POA: Diagnosis not present

## 2018-12-09 DIAGNOSIS — G311 Senile degeneration of brain, not elsewhere classified: Secondary | ICD-10-CM | POA: Diagnosis not present

## 2018-12-09 DIAGNOSIS — R413 Other amnesia: Secondary | ICD-10-CM | POA: Diagnosis not present

## 2018-12-14 DIAGNOSIS — G311 Senile degeneration of brain, not elsewhere classified: Secondary | ICD-10-CM | POA: Diagnosis not present

## 2018-12-14 DIAGNOSIS — R413 Other amnesia: Secondary | ICD-10-CM | POA: Diagnosis not present

## 2018-12-14 DIAGNOSIS — E785 Hyperlipidemia, unspecified: Secondary | ICD-10-CM | POA: Diagnosis not present

## 2018-12-14 DIAGNOSIS — F411 Generalized anxiety disorder: Secondary | ICD-10-CM | POA: Diagnosis not present

## 2018-12-14 DIAGNOSIS — M069 Rheumatoid arthritis, unspecified: Secondary | ICD-10-CM | POA: Diagnosis not present

## 2018-12-14 DIAGNOSIS — Z9010 Acquired absence of unspecified breast and nipple: Secondary | ICD-10-CM | POA: Diagnosis not present

## 2018-12-15 DIAGNOSIS — Z9010 Acquired absence of unspecified breast and nipple: Secondary | ICD-10-CM | POA: Diagnosis not present

## 2018-12-15 DIAGNOSIS — G311 Senile degeneration of brain, not elsewhere classified: Secondary | ICD-10-CM | POA: Diagnosis not present

## 2018-12-15 DIAGNOSIS — M069 Rheumatoid arthritis, unspecified: Secondary | ICD-10-CM | POA: Diagnosis not present

## 2018-12-15 DIAGNOSIS — F411 Generalized anxiety disorder: Secondary | ICD-10-CM | POA: Diagnosis not present

## 2018-12-15 DIAGNOSIS — E785 Hyperlipidemia, unspecified: Secondary | ICD-10-CM | POA: Diagnosis not present

## 2018-12-15 DIAGNOSIS — R413 Other amnesia: Secondary | ICD-10-CM | POA: Diagnosis not present

## 2018-12-16 DIAGNOSIS — Z9010 Acquired absence of unspecified breast and nipple: Secondary | ICD-10-CM | POA: Diagnosis not present

## 2018-12-16 DIAGNOSIS — F411 Generalized anxiety disorder: Secondary | ICD-10-CM | POA: Diagnosis not present

## 2018-12-16 DIAGNOSIS — E785 Hyperlipidemia, unspecified: Secondary | ICD-10-CM | POA: Diagnosis not present

## 2018-12-16 DIAGNOSIS — G311 Senile degeneration of brain, not elsewhere classified: Secondary | ICD-10-CM | POA: Diagnosis not present

## 2018-12-16 DIAGNOSIS — R413 Other amnesia: Secondary | ICD-10-CM | POA: Diagnosis not present

## 2018-12-16 DIAGNOSIS — M069 Rheumatoid arthritis, unspecified: Secondary | ICD-10-CM | POA: Diagnosis not present

## 2018-12-19 DIAGNOSIS — Z9010 Acquired absence of unspecified breast and nipple: Secondary | ICD-10-CM | POA: Diagnosis not present

## 2018-12-19 DIAGNOSIS — G311 Senile degeneration of brain, not elsewhere classified: Secondary | ICD-10-CM | POA: Diagnosis not present

## 2018-12-19 DIAGNOSIS — E785 Hyperlipidemia, unspecified: Secondary | ICD-10-CM | POA: Diagnosis not present

## 2018-12-19 DIAGNOSIS — R413 Other amnesia: Secondary | ICD-10-CM | POA: Diagnosis not present

## 2018-12-19 DIAGNOSIS — F411 Generalized anxiety disorder: Secondary | ICD-10-CM | POA: Diagnosis not present

## 2018-12-19 DIAGNOSIS — M069 Rheumatoid arthritis, unspecified: Secondary | ICD-10-CM | POA: Diagnosis not present

## 2018-12-21 DIAGNOSIS — R413 Other amnesia: Secondary | ICD-10-CM | POA: Diagnosis not present

## 2018-12-21 DIAGNOSIS — E785 Hyperlipidemia, unspecified: Secondary | ICD-10-CM | POA: Diagnosis not present

## 2018-12-21 DIAGNOSIS — Z9010 Acquired absence of unspecified breast and nipple: Secondary | ICD-10-CM | POA: Diagnosis not present

## 2018-12-21 DIAGNOSIS — M069 Rheumatoid arthritis, unspecified: Secondary | ICD-10-CM | POA: Diagnosis not present

## 2018-12-21 DIAGNOSIS — F411 Generalized anxiety disorder: Secondary | ICD-10-CM | POA: Diagnosis not present

## 2018-12-21 DIAGNOSIS — G311 Senile degeneration of brain, not elsewhere classified: Secondary | ICD-10-CM | POA: Diagnosis not present

## 2018-12-22 DIAGNOSIS — E785 Hyperlipidemia, unspecified: Secondary | ICD-10-CM | POA: Diagnosis not present

## 2018-12-22 DIAGNOSIS — R413 Other amnesia: Secondary | ICD-10-CM | POA: Diagnosis not present

## 2018-12-22 DIAGNOSIS — F411 Generalized anxiety disorder: Secondary | ICD-10-CM | POA: Diagnosis not present

## 2018-12-22 DIAGNOSIS — G311 Senile degeneration of brain, not elsewhere classified: Secondary | ICD-10-CM | POA: Diagnosis not present

## 2018-12-22 DIAGNOSIS — M069 Rheumatoid arthritis, unspecified: Secondary | ICD-10-CM | POA: Diagnosis not present

## 2018-12-22 DIAGNOSIS — Z9010 Acquired absence of unspecified breast and nipple: Secondary | ICD-10-CM | POA: Diagnosis not present

## 2018-12-23 DIAGNOSIS — G311 Senile degeneration of brain, not elsewhere classified: Secondary | ICD-10-CM | POA: Diagnosis not present

## 2018-12-23 DIAGNOSIS — R413 Other amnesia: Secondary | ICD-10-CM | POA: Diagnosis not present

## 2018-12-23 DIAGNOSIS — E785 Hyperlipidemia, unspecified: Secondary | ICD-10-CM | POA: Diagnosis not present

## 2018-12-23 DIAGNOSIS — F411 Generalized anxiety disorder: Secondary | ICD-10-CM | POA: Diagnosis not present

## 2018-12-23 DIAGNOSIS — R51 Headache: Secondary | ICD-10-CM | POA: Diagnosis not present

## 2018-12-23 DIAGNOSIS — M069 Rheumatoid arthritis, unspecified: Secondary | ICD-10-CM | POA: Diagnosis not present

## 2018-12-23 DIAGNOSIS — Z9010 Acquired absence of unspecified breast and nipple: Secondary | ICD-10-CM | POA: Diagnosis not present

## 2018-12-26 DIAGNOSIS — Z9010 Acquired absence of unspecified breast and nipple: Secondary | ICD-10-CM | POA: Diagnosis not present

## 2018-12-26 DIAGNOSIS — G311 Senile degeneration of brain, not elsewhere classified: Secondary | ICD-10-CM | POA: Diagnosis not present

## 2018-12-26 DIAGNOSIS — F411 Generalized anxiety disorder: Secondary | ICD-10-CM | POA: Diagnosis not present

## 2018-12-26 DIAGNOSIS — M069 Rheumatoid arthritis, unspecified: Secondary | ICD-10-CM | POA: Diagnosis not present

## 2018-12-26 DIAGNOSIS — R413 Other amnesia: Secondary | ICD-10-CM | POA: Diagnosis not present

## 2018-12-26 DIAGNOSIS — E785 Hyperlipidemia, unspecified: Secondary | ICD-10-CM | POA: Diagnosis not present

## 2018-12-29 DIAGNOSIS — R413 Other amnesia: Secondary | ICD-10-CM | POA: Diagnosis not present

## 2018-12-29 DIAGNOSIS — M069 Rheumatoid arthritis, unspecified: Secondary | ICD-10-CM | POA: Diagnosis not present

## 2018-12-29 DIAGNOSIS — F411 Generalized anxiety disorder: Secondary | ICD-10-CM | POA: Diagnosis not present

## 2018-12-29 DIAGNOSIS — Z9010 Acquired absence of unspecified breast and nipple: Secondary | ICD-10-CM | POA: Diagnosis not present

## 2018-12-29 DIAGNOSIS — G311 Senile degeneration of brain, not elsewhere classified: Secondary | ICD-10-CM | POA: Diagnosis not present

## 2018-12-29 DIAGNOSIS — E785 Hyperlipidemia, unspecified: Secondary | ICD-10-CM | POA: Diagnosis not present

## 2018-12-30 DIAGNOSIS — R413 Other amnesia: Secondary | ICD-10-CM | POA: Diagnosis not present

## 2018-12-30 DIAGNOSIS — G311 Senile degeneration of brain, not elsewhere classified: Secondary | ICD-10-CM | POA: Diagnosis not present

## 2018-12-30 DIAGNOSIS — Z9010 Acquired absence of unspecified breast and nipple: Secondary | ICD-10-CM | POA: Diagnosis not present

## 2018-12-30 DIAGNOSIS — F411 Generalized anxiety disorder: Secondary | ICD-10-CM | POA: Diagnosis not present

## 2018-12-30 DIAGNOSIS — E785 Hyperlipidemia, unspecified: Secondary | ICD-10-CM | POA: Diagnosis not present

## 2018-12-30 DIAGNOSIS — M069 Rheumatoid arthritis, unspecified: Secondary | ICD-10-CM | POA: Diagnosis not present

## 2019-01-02 DIAGNOSIS — F411 Generalized anxiety disorder: Secondary | ICD-10-CM | POA: Diagnosis not present

## 2019-01-02 DIAGNOSIS — R413 Other amnesia: Secondary | ICD-10-CM | POA: Diagnosis not present

## 2019-01-02 DIAGNOSIS — G311 Senile degeneration of brain, not elsewhere classified: Secondary | ICD-10-CM | POA: Diagnosis not present

## 2019-01-02 DIAGNOSIS — E785 Hyperlipidemia, unspecified: Secondary | ICD-10-CM | POA: Diagnosis not present

## 2019-01-02 DIAGNOSIS — M069 Rheumatoid arthritis, unspecified: Secondary | ICD-10-CM | POA: Diagnosis not present

## 2019-01-02 DIAGNOSIS — Z9010 Acquired absence of unspecified breast and nipple: Secondary | ICD-10-CM | POA: Diagnosis not present

## 2019-01-04 ENCOUNTER — Encounter: Payer: Self-pay | Admitting: Internal Medicine

## 2019-01-04 DIAGNOSIS — E785 Hyperlipidemia, unspecified: Secondary | ICD-10-CM | POA: Diagnosis not present

## 2019-01-04 DIAGNOSIS — F411 Generalized anxiety disorder: Secondary | ICD-10-CM | POA: Diagnosis not present

## 2019-01-04 DIAGNOSIS — G311 Senile degeneration of brain, not elsewhere classified: Secondary | ICD-10-CM | POA: Diagnosis not present

## 2019-01-04 DIAGNOSIS — Z9010 Acquired absence of unspecified breast and nipple: Secondary | ICD-10-CM | POA: Diagnosis not present

## 2019-01-04 DIAGNOSIS — R413 Other amnesia: Secondary | ICD-10-CM | POA: Diagnosis not present

## 2019-01-04 DIAGNOSIS — M069 Rheumatoid arthritis, unspecified: Secondary | ICD-10-CM | POA: Diagnosis not present

## 2019-01-05 DIAGNOSIS — M069 Rheumatoid arthritis, unspecified: Secondary | ICD-10-CM | POA: Diagnosis not present

## 2019-01-05 DIAGNOSIS — E785 Hyperlipidemia, unspecified: Secondary | ICD-10-CM | POA: Diagnosis not present

## 2019-01-05 DIAGNOSIS — F411 Generalized anxiety disorder: Secondary | ICD-10-CM | POA: Diagnosis not present

## 2019-01-05 DIAGNOSIS — Z9010 Acquired absence of unspecified breast and nipple: Secondary | ICD-10-CM | POA: Diagnosis not present

## 2019-01-05 DIAGNOSIS — G311 Senile degeneration of brain, not elsewhere classified: Secondary | ICD-10-CM | POA: Diagnosis not present

## 2019-01-05 DIAGNOSIS — R413 Other amnesia: Secondary | ICD-10-CM | POA: Diagnosis not present

## 2019-01-06 DIAGNOSIS — M069 Rheumatoid arthritis, unspecified: Secondary | ICD-10-CM | POA: Diagnosis not present

## 2019-01-06 DIAGNOSIS — Z9010 Acquired absence of unspecified breast and nipple: Secondary | ICD-10-CM | POA: Diagnosis not present

## 2019-01-06 DIAGNOSIS — R413 Other amnesia: Secondary | ICD-10-CM | POA: Diagnosis not present

## 2019-01-06 DIAGNOSIS — G311 Senile degeneration of brain, not elsewhere classified: Secondary | ICD-10-CM | POA: Diagnosis not present

## 2019-01-06 DIAGNOSIS — F411 Generalized anxiety disorder: Secondary | ICD-10-CM | POA: Diagnosis not present

## 2019-01-06 DIAGNOSIS — E785 Hyperlipidemia, unspecified: Secondary | ICD-10-CM | POA: Diagnosis not present

## 2019-01-09 DIAGNOSIS — R413 Other amnesia: Secondary | ICD-10-CM | POA: Diagnosis not present

## 2019-01-09 DIAGNOSIS — Z9010 Acquired absence of unspecified breast and nipple: Secondary | ICD-10-CM | POA: Diagnosis not present

## 2019-01-09 DIAGNOSIS — M069 Rheumatoid arthritis, unspecified: Secondary | ICD-10-CM | POA: Diagnosis not present

## 2019-01-09 DIAGNOSIS — E785 Hyperlipidemia, unspecified: Secondary | ICD-10-CM | POA: Diagnosis not present

## 2019-01-09 DIAGNOSIS — G311 Senile degeneration of brain, not elsewhere classified: Secondary | ICD-10-CM | POA: Diagnosis not present

## 2019-01-09 DIAGNOSIS — F411 Generalized anxiety disorder: Secondary | ICD-10-CM | POA: Diagnosis not present

## 2019-01-11 DIAGNOSIS — E785 Hyperlipidemia, unspecified: Secondary | ICD-10-CM | POA: Diagnosis not present

## 2019-01-11 DIAGNOSIS — G311 Senile degeneration of brain, not elsewhere classified: Secondary | ICD-10-CM | POA: Diagnosis not present

## 2019-01-11 DIAGNOSIS — R413 Other amnesia: Secondary | ICD-10-CM | POA: Diagnosis not present

## 2019-01-11 DIAGNOSIS — F411 Generalized anxiety disorder: Secondary | ICD-10-CM | POA: Diagnosis not present

## 2019-01-11 DIAGNOSIS — Z9010 Acquired absence of unspecified breast and nipple: Secondary | ICD-10-CM | POA: Diagnosis not present

## 2019-01-11 DIAGNOSIS — M069 Rheumatoid arthritis, unspecified: Secondary | ICD-10-CM | POA: Diagnosis not present

## 2019-01-12 DIAGNOSIS — E785 Hyperlipidemia, unspecified: Secondary | ICD-10-CM | POA: Diagnosis not present

## 2019-01-12 DIAGNOSIS — R413 Other amnesia: Secondary | ICD-10-CM | POA: Diagnosis not present

## 2019-01-12 DIAGNOSIS — Z9010 Acquired absence of unspecified breast and nipple: Secondary | ICD-10-CM | POA: Diagnosis not present

## 2019-01-12 DIAGNOSIS — F411 Generalized anxiety disorder: Secondary | ICD-10-CM | POA: Diagnosis not present

## 2019-01-12 DIAGNOSIS — M069 Rheumatoid arthritis, unspecified: Secondary | ICD-10-CM | POA: Diagnosis not present

## 2019-01-12 DIAGNOSIS — G311 Senile degeneration of brain, not elsewhere classified: Secondary | ICD-10-CM | POA: Diagnosis not present

## 2019-01-13 DIAGNOSIS — F411 Generalized anxiety disorder: Secondary | ICD-10-CM | POA: Diagnosis not present

## 2019-01-13 DIAGNOSIS — M069 Rheumatoid arthritis, unspecified: Secondary | ICD-10-CM | POA: Diagnosis not present

## 2019-01-13 DIAGNOSIS — E785 Hyperlipidemia, unspecified: Secondary | ICD-10-CM | POA: Diagnosis not present

## 2019-01-13 DIAGNOSIS — G311 Senile degeneration of brain, not elsewhere classified: Secondary | ICD-10-CM | POA: Diagnosis not present

## 2019-01-13 DIAGNOSIS — Z9010 Acquired absence of unspecified breast and nipple: Secondary | ICD-10-CM | POA: Diagnosis not present

## 2019-01-13 DIAGNOSIS — R413 Other amnesia: Secondary | ICD-10-CM | POA: Diagnosis not present

## 2019-01-16 DIAGNOSIS — R413 Other amnesia: Secondary | ICD-10-CM | POA: Diagnosis not present

## 2019-01-16 DIAGNOSIS — G311 Senile degeneration of brain, not elsewhere classified: Secondary | ICD-10-CM | POA: Diagnosis not present

## 2019-01-16 DIAGNOSIS — M069 Rheumatoid arthritis, unspecified: Secondary | ICD-10-CM | POA: Diagnosis not present

## 2019-01-16 DIAGNOSIS — E785 Hyperlipidemia, unspecified: Secondary | ICD-10-CM | POA: Diagnosis not present

## 2019-01-16 DIAGNOSIS — Z9010 Acquired absence of unspecified breast and nipple: Secondary | ICD-10-CM | POA: Diagnosis not present

## 2019-01-16 DIAGNOSIS — F411 Generalized anxiety disorder: Secondary | ICD-10-CM | POA: Diagnosis not present

## 2019-01-18 DIAGNOSIS — E785 Hyperlipidemia, unspecified: Secondary | ICD-10-CM | POA: Diagnosis not present

## 2019-01-18 DIAGNOSIS — G311 Senile degeneration of brain, not elsewhere classified: Secondary | ICD-10-CM | POA: Diagnosis not present

## 2019-01-18 DIAGNOSIS — F411 Generalized anxiety disorder: Secondary | ICD-10-CM | POA: Diagnosis not present

## 2019-01-18 DIAGNOSIS — M069 Rheumatoid arthritis, unspecified: Secondary | ICD-10-CM | POA: Diagnosis not present

## 2019-01-18 DIAGNOSIS — Z9010 Acquired absence of unspecified breast and nipple: Secondary | ICD-10-CM | POA: Diagnosis not present

## 2019-01-18 DIAGNOSIS — R413 Other amnesia: Secondary | ICD-10-CM | POA: Diagnosis not present

## 2019-01-20 DIAGNOSIS — Z9010 Acquired absence of unspecified breast and nipple: Secondary | ICD-10-CM | POA: Diagnosis not present

## 2019-01-20 DIAGNOSIS — G311 Senile degeneration of brain, not elsewhere classified: Secondary | ICD-10-CM | POA: Diagnosis not present

## 2019-01-20 DIAGNOSIS — M069 Rheumatoid arthritis, unspecified: Secondary | ICD-10-CM | POA: Diagnosis not present

## 2019-01-20 DIAGNOSIS — F411 Generalized anxiety disorder: Secondary | ICD-10-CM | POA: Diagnosis not present

## 2019-01-20 DIAGNOSIS — R413 Other amnesia: Secondary | ICD-10-CM | POA: Diagnosis not present

## 2019-01-20 DIAGNOSIS — E785 Hyperlipidemia, unspecified: Secondary | ICD-10-CM | POA: Diagnosis not present

## 2019-01-23 DIAGNOSIS — G311 Senile degeneration of brain, not elsewhere classified: Secondary | ICD-10-CM | POA: Diagnosis not present

## 2019-01-23 DIAGNOSIS — Z9010 Acquired absence of unspecified breast and nipple: Secondary | ICD-10-CM | POA: Diagnosis not present

## 2019-01-23 DIAGNOSIS — R413 Other amnesia: Secondary | ICD-10-CM | POA: Diagnosis not present

## 2019-01-23 DIAGNOSIS — E785 Hyperlipidemia, unspecified: Secondary | ICD-10-CM | POA: Diagnosis not present

## 2019-01-23 DIAGNOSIS — F411 Generalized anxiety disorder: Secondary | ICD-10-CM | POA: Diagnosis not present

## 2019-01-23 DIAGNOSIS — M069 Rheumatoid arthritis, unspecified: Secondary | ICD-10-CM | POA: Diagnosis not present

## 2019-01-23 DIAGNOSIS — F0150 Vascular dementia without behavioral disturbance: Secondary | ICD-10-CM | POA: Diagnosis not present

## 2019-01-25 DIAGNOSIS — M069 Rheumatoid arthritis, unspecified: Secondary | ICD-10-CM | POA: Diagnosis not present

## 2019-01-25 DIAGNOSIS — E785 Hyperlipidemia, unspecified: Secondary | ICD-10-CM | POA: Diagnosis not present

## 2019-01-25 DIAGNOSIS — R413 Other amnesia: Secondary | ICD-10-CM | POA: Diagnosis not present

## 2019-01-25 DIAGNOSIS — G311 Senile degeneration of brain, not elsewhere classified: Secondary | ICD-10-CM | POA: Diagnosis not present

## 2019-01-25 DIAGNOSIS — Z9010 Acquired absence of unspecified breast and nipple: Secondary | ICD-10-CM | POA: Diagnosis not present

## 2019-01-25 DIAGNOSIS — F411 Generalized anxiety disorder: Secondary | ICD-10-CM | POA: Diagnosis not present

## 2019-01-27 DIAGNOSIS — R413 Other amnesia: Secondary | ICD-10-CM | POA: Diagnosis not present

## 2019-01-27 DIAGNOSIS — Z9010 Acquired absence of unspecified breast and nipple: Secondary | ICD-10-CM | POA: Diagnosis not present

## 2019-01-27 DIAGNOSIS — F411 Generalized anxiety disorder: Secondary | ICD-10-CM | POA: Diagnosis not present

## 2019-01-27 DIAGNOSIS — M069 Rheumatoid arthritis, unspecified: Secondary | ICD-10-CM | POA: Diagnosis not present

## 2019-01-27 DIAGNOSIS — G311 Senile degeneration of brain, not elsewhere classified: Secondary | ICD-10-CM | POA: Diagnosis not present

## 2019-01-27 DIAGNOSIS — E785 Hyperlipidemia, unspecified: Secondary | ICD-10-CM | POA: Diagnosis not present

## 2019-01-30 DIAGNOSIS — G311 Senile degeneration of brain, not elsewhere classified: Secondary | ICD-10-CM | POA: Diagnosis not present

## 2019-01-30 DIAGNOSIS — E785 Hyperlipidemia, unspecified: Secondary | ICD-10-CM | POA: Diagnosis not present

## 2019-01-30 DIAGNOSIS — R413 Other amnesia: Secondary | ICD-10-CM | POA: Diagnosis not present

## 2019-01-30 DIAGNOSIS — Z9010 Acquired absence of unspecified breast and nipple: Secondary | ICD-10-CM | POA: Diagnosis not present

## 2019-01-30 DIAGNOSIS — M069 Rheumatoid arthritis, unspecified: Secondary | ICD-10-CM | POA: Diagnosis not present

## 2019-01-30 DIAGNOSIS — F411 Generalized anxiety disorder: Secondary | ICD-10-CM | POA: Diagnosis not present

## 2019-02-01 DIAGNOSIS — G311 Senile degeneration of brain, not elsewhere classified: Secondary | ICD-10-CM | POA: Diagnosis not present

## 2019-02-01 DIAGNOSIS — E785 Hyperlipidemia, unspecified: Secondary | ICD-10-CM | POA: Diagnosis not present

## 2019-02-01 DIAGNOSIS — R413 Other amnesia: Secondary | ICD-10-CM | POA: Diagnosis not present

## 2019-02-01 DIAGNOSIS — F411 Generalized anxiety disorder: Secondary | ICD-10-CM | POA: Diagnosis not present

## 2019-02-01 DIAGNOSIS — M069 Rheumatoid arthritis, unspecified: Secondary | ICD-10-CM | POA: Diagnosis not present

## 2019-02-01 DIAGNOSIS — Z9010 Acquired absence of unspecified breast and nipple: Secondary | ICD-10-CM | POA: Diagnosis not present

## 2019-02-03 DIAGNOSIS — M069 Rheumatoid arthritis, unspecified: Secondary | ICD-10-CM | POA: Diagnosis not present

## 2019-02-03 DIAGNOSIS — R413 Other amnesia: Secondary | ICD-10-CM | POA: Diagnosis not present

## 2019-02-03 DIAGNOSIS — E785 Hyperlipidemia, unspecified: Secondary | ICD-10-CM | POA: Diagnosis not present

## 2019-02-03 DIAGNOSIS — F411 Generalized anxiety disorder: Secondary | ICD-10-CM | POA: Diagnosis not present

## 2019-02-03 DIAGNOSIS — G311 Senile degeneration of brain, not elsewhere classified: Secondary | ICD-10-CM | POA: Diagnosis not present

## 2019-02-03 DIAGNOSIS — Z9010 Acquired absence of unspecified breast and nipple: Secondary | ICD-10-CM | POA: Diagnosis not present

## 2019-02-05 DIAGNOSIS — F411 Generalized anxiety disorder: Secondary | ICD-10-CM | POA: Diagnosis not present

## 2019-02-05 DIAGNOSIS — M069 Rheumatoid arthritis, unspecified: Secondary | ICD-10-CM | POA: Diagnosis not present

## 2019-02-05 DIAGNOSIS — E785 Hyperlipidemia, unspecified: Secondary | ICD-10-CM | POA: Diagnosis not present

## 2019-02-05 DIAGNOSIS — G311 Senile degeneration of brain, not elsewhere classified: Secondary | ICD-10-CM | POA: Diagnosis not present

## 2019-02-05 DIAGNOSIS — R413 Other amnesia: Secondary | ICD-10-CM | POA: Diagnosis not present

## 2019-02-05 DIAGNOSIS — Z9010 Acquired absence of unspecified breast and nipple: Secondary | ICD-10-CM | POA: Diagnosis not present

## 2019-02-06 DIAGNOSIS — Z9010 Acquired absence of unspecified breast and nipple: Secondary | ICD-10-CM | POA: Diagnosis not present

## 2019-02-06 DIAGNOSIS — F411 Generalized anxiety disorder: Secondary | ICD-10-CM | POA: Diagnosis not present

## 2019-02-06 DIAGNOSIS — G311 Senile degeneration of brain, not elsewhere classified: Secondary | ICD-10-CM | POA: Diagnosis not present

## 2019-02-06 DIAGNOSIS — E785 Hyperlipidemia, unspecified: Secondary | ICD-10-CM | POA: Diagnosis not present

## 2019-02-06 DIAGNOSIS — M069 Rheumatoid arthritis, unspecified: Secondary | ICD-10-CM | POA: Diagnosis not present

## 2019-02-06 DIAGNOSIS — R413 Other amnesia: Secondary | ICD-10-CM | POA: Diagnosis not present

## 2019-02-08 DIAGNOSIS — G311 Senile degeneration of brain, not elsewhere classified: Secondary | ICD-10-CM | POA: Diagnosis not present

## 2019-02-08 DIAGNOSIS — F411 Generalized anxiety disorder: Secondary | ICD-10-CM | POA: Diagnosis not present

## 2019-02-08 DIAGNOSIS — Z9010 Acquired absence of unspecified breast and nipple: Secondary | ICD-10-CM | POA: Diagnosis not present

## 2019-02-08 DIAGNOSIS — R413 Other amnesia: Secondary | ICD-10-CM | POA: Diagnosis not present

## 2019-02-08 DIAGNOSIS — E785 Hyperlipidemia, unspecified: Secondary | ICD-10-CM | POA: Diagnosis not present

## 2019-02-08 DIAGNOSIS — M069 Rheumatoid arthritis, unspecified: Secondary | ICD-10-CM | POA: Diagnosis not present

## 2019-02-09 DIAGNOSIS — F411 Generalized anxiety disorder: Secondary | ICD-10-CM | POA: Diagnosis not present

## 2019-02-09 DIAGNOSIS — M069 Rheumatoid arthritis, unspecified: Secondary | ICD-10-CM | POA: Diagnosis not present

## 2019-02-09 DIAGNOSIS — E785 Hyperlipidemia, unspecified: Secondary | ICD-10-CM | POA: Diagnosis not present

## 2019-02-09 DIAGNOSIS — Z9010 Acquired absence of unspecified breast and nipple: Secondary | ICD-10-CM | POA: Diagnosis not present

## 2019-02-09 DIAGNOSIS — R413 Other amnesia: Secondary | ICD-10-CM | POA: Diagnosis not present

## 2019-02-09 DIAGNOSIS — G311 Senile degeneration of brain, not elsewhere classified: Secondary | ICD-10-CM | POA: Diagnosis not present

## 2019-02-10 DIAGNOSIS — E785 Hyperlipidemia, unspecified: Secondary | ICD-10-CM | POA: Diagnosis not present

## 2019-02-10 DIAGNOSIS — F411 Generalized anxiety disorder: Secondary | ICD-10-CM | POA: Diagnosis not present

## 2019-02-10 DIAGNOSIS — Z9010 Acquired absence of unspecified breast and nipple: Secondary | ICD-10-CM | POA: Diagnosis not present

## 2019-02-10 DIAGNOSIS — M069 Rheumatoid arthritis, unspecified: Secondary | ICD-10-CM | POA: Diagnosis not present

## 2019-02-10 DIAGNOSIS — G311 Senile degeneration of brain, not elsewhere classified: Secondary | ICD-10-CM | POA: Diagnosis not present

## 2019-02-10 DIAGNOSIS — R413 Other amnesia: Secondary | ICD-10-CM | POA: Diagnosis not present

## 2019-02-13 DIAGNOSIS — Z9010 Acquired absence of unspecified breast and nipple: Secondary | ICD-10-CM | POA: Diagnosis not present

## 2019-02-13 DIAGNOSIS — G311 Senile degeneration of brain, not elsewhere classified: Secondary | ICD-10-CM | POA: Diagnosis not present

## 2019-02-13 DIAGNOSIS — M069 Rheumatoid arthritis, unspecified: Secondary | ICD-10-CM | POA: Diagnosis not present

## 2019-02-13 DIAGNOSIS — F411 Generalized anxiety disorder: Secondary | ICD-10-CM | POA: Diagnosis not present

## 2019-02-13 DIAGNOSIS — R413 Other amnesia: Secondary | ICD-10-CM | POA: Diagnosis not present

## 2019-02-13 DIAGNOSIS — E785 Hyperlipidemia, unspecified: Secondary | ICD-10-CM | POA: Diagnosis not present

## 2019-02-14 DIAGNOSIS — Z9010 Acquired absence of unspecified breast and nipple: Secondary | ICD-10-CM | POA: Diagnosis not present

## 2019-02-14 DIAGNOSIS — G311 Senile degeneration of brain, not elsewhere classified: Secondary | ICD-10-CM | POA: Diagnosis not present

## 2019-02-14 DIAGNOSIS — E785 Hyperlipidemia, unspecified: Secondary | ICD-10-CM | POA: Diagnosis not present

## 2019-02-14 DIAGNOSIS — R413 Other amnesia: Secondary | ICD-10-CM | POA: Diagnosis not present

## 2019-02-14 DIAGNOSIS — M069 Rheumatoid arthritis, unspecified: Secondary | ICD-10-CM | POA: Diagnosis not present

## 2019-02-14 DIAGNOSIS — F411 Generalized anxiety disorder: Secondary | ICD-10-CM | POA: Diagnosis not present

## 2019-02-15 DIAGNOSIS — E785 Hyperlipidemia, unspecified: Secondary | ICD-10-CM | POA: Diagnosis not present

## 2019-02-15 DIAGNOSIS — M069 Rheumatoid arthritis, unspecified: Secondary | ICD-10-CM | POA: Diagnosis not present

## 2019-02-15 DIAGNOSIS — Z9010 Acquired absence of unspecified breast and nipple: Secondary | ICD-10-CM | POA: Diagnosis not present

## 2019-02-15 DIAGNOSIS — F411 Generalized anxiety disorder: Secondary | ICD-10-CM | POA: Diagnosis not present

## 2019-02-15 DIAGNOSIS — G311 Senile degeneration of brain, not elsewhere classified: Secondary | ICD-10-CM | POA: Diagnosis not present

## 2019-02-15 DIAGNOSIS — R413 Other amnesia: Secondary | ICD-10-CM | POA: Diagnosis not present

## 2019-02-17 DIAGNOSIS — G311 Senile degeneration of brain, not elsewhere classified: Secondary | ICD-10-CM | POA: Diagnosis not present

## 2019-02-17 DIAGNOSIS — F411 Generalized anxiety disorder: Secondary | ICD-10-CM | POA: Diagnosis not present

## 2019-02-17 DIAGNOSIS — M069 Rheumatoid arthritis, unspecified: Secondary | ICD-10-CM | POA: Diagnosis not present

## 2019-02-17 DIAGNOSIS — E785 Hyperlipidemia, unspecified: Secondary | ICD-10-CM | POA: Diagnosis not present

## 2019-02-17 DIAGNOSIS — Z9010 Acquired absence of unspecified breast and nipple: Secondary | ICD-10-CM | POA: Diagnosis not present

## 2019-02-17 DIAGNOSIS — R413 Other amnesia: Secondary | ICD-10-CM | POA: Diagnosis not present

## 2019-02-20 DIAGNOSIS — F411 Generalized anxiety disorder: Secondary | ICD-10-CM | POA: Diagnosis not present

## 2019-02-20 DIAGNOSIS — R413 Other amnesia: Secondary | ICD-10-CM | POA: Diagnosis not present

## 2019-02-20 DIAGNOSIS — E785 Hyperlipidemia, unspecified: Secondary | ICD-10-CM | POA: Diagnosis not present

## 2019-02-20 DIAGNOSIS — G311 Senile degeneration of brain, not elsewhere classified: Secondary | ICD-10-CM | POA: Diagnosis not present

## 2019-02-20 DIAGNOSIS — M069 Rheumatoid arthritis, unspecified: Secondary | ICD-10-CM | POA: Diagnosis not present

## 2019-02-20 DIAGNOSIS — Z9010 Acquired absence of unspecified breast and nipple: Secondary | ICD-10-CM | POA: Diagnosis not present

## 2019-02-22 DIAGNOSIS — R413 Other amnesia: Secondary | ICD-10-CM | POA: Diagnosis not present

## 2019-02-22 DIAGNOSIS — Z9010 Acquired absence of unspecified breast and nipple: Secondary | ICD-10-CM | POA: Diagnosis not present

## 2019-02-22 DIAGNOSIS — M069 Rheumatoid arthritis, unspecified: Secondary | ICD-10-CM | POA: Diagnosis not present

## 2019-02-22 DIAGNOSIS — F411 Generalized anxiety disorder: Secondary | ICD-10-CM | POA: Diagnosis not present

## 2019-02-22 DIAGNOSIS — E785 Hyperlipidemia, unspecified: Secondary | ICD-10-CM | POA: Diagnosis not present

## 2019-02-22 DIAGNOSIS — G311 Senile degeneration of brain, not elsewhere classified: Secondary | ICD-10-CM | POA: Diagnosis not present

## 2019-02-23 DIAGNOSIS — F0150 Vascular dementia without behavioral disturbance: Secondary | ICD-10-CM | POA: Diagnosis not present

## 2019-02-24 DIAGNOSIS — M069 Rheumatoid arthritis, unspecified: Secondary | ICD-10-CM | POA: Diagnosis not present

## 2019-02-24 DIAGNOSIS — E785 Hyperlipidemia, unspecified: Secondary | ICD-10-CM | POA: Diagnosis not present

## 2019-02-24 DIAGNOSIS — F411 Generalized anxiety disorder: Secondary | ICD-10-CM | POA: Diagnosis not present

## 2019-02-24 DIAGNOSIS — Z9010 Acquired absence of unspecified breast and nipple: Secondary | ICD-10-CM | POA: Diagnosis not present

## 2019-02-24 DIAGNOSIS — G311 Senile degeneration of brain, not elsewhere classified: Secondary | ICD-10-CM | POA: Diagnosis not present

## 2019-02-24 DIAGNOSIS — R413 Other amnesia: Secondary | ICD-10-CM | POA: Diagnosis not present

## 2019-02-27 DIAGNOSIS — M069 Rheumatoid arthritis, unspecified: Secondary | ICD-10-CM | POA: Diagnosis not present

## 2019-02-27 DIAGNOSIS — Z9010 Acquired absence of unspecified breast and nipple: Secondary | ICD-10-CM | POA: Diagnosis not present

## 2019-02-27 DIAGNOSIS — E785 Hyperlipidemia, unspecified: Secondary | ICD-10-CM | POA: Diagnosis not present

## 2019-02-27 DIAGNOSIS — R413 Other amnesia: Secondary | ICD-10-CM | POA: Diagnosis not present

## 2019-02-27 DIAGNOSIS — G311 Senile degeneration of brain, not elsewhere classified: Secondary | ICD-10-CM | POA: Diagnosis not present

## 2019-02-27 DIAGNOSIS — F411 Generalized anxiety disorder: Secondary | ICD-10-CM | POA: Diagnosis not present

## 2019-02-28 DIAGNOSIS — M069 Rheumatoid arthritis, unspecified: Secondary | ICD-10-CM | POA: Diagnosis not present

## 2019-02-28 DIAGNOSIS — E785 Hyperlipidemia, unspecified: Secondary | ICD-10-CM | POA: Diagnosis not present

## 2019-02-28 DIAGNOSIS — F411 Generalized anxiety disorder: Secondary | ICD-10-CM | POA: Diagnosis not present

## 2019-02-28 DIAGNOSIS — G311 Senile degeneration of brain, not elsewhere classified: Secondary | ICD-10-CM | POA: Diagnosis not present

## 2019-02-28 DIAGNOSIS — R413 Other amnesia: Secondary | ICD-10-CM | POA: Diagnosis not present

## 2019-02-28 DIAGNOSIS — Z9010 Acquired absence of unspecified breast and nipple: Secondary | ICD-10-CM | POA: Diagnosis not present

## 2019-03-01 DIAGNOSIS — F411 Generalized anxiety disorder: Secondary | ICD-10-CM | POA: Diagnosis not present

## 2019-03-01 DIAGNOSIS — R413 Other amnesia: Secondary | ICD-10-CM | POA: Diagnosis not present

## 2019-03-01 DIAGNOSIS — Z9010 Acquired absence of unspecified breast and nipple: Secondary | ICD-10-CM | POA: Diagnosis not present

## 2019-03-01 DIAGNOSIS — G311 Senile degeneration of brain, not elsewhere classified: Secondary | ICD-10-CM | POA: Diagnosis not present

## 2019-03-01 DIAGNOSIS — M069 Rheumatoid arthritis, unspecified: Secondary | ICD-10-CM | POA: Diagnosis not present

## 2019-03-01 DIAGNOSIS — E785 Hyperlipidemia, unspecified: Secondary | ICD-10-CM | POA: Diagnosis not present

## 2019-03-03 DIAGNOSIS — F411 Generalized anxiety disorder: Secondary | ICD-10-CM | POA: Diagnosis not present

## 2019-03-03 DIAGNOSIS — E785 Hyperlipidemia, unspecified: Secondary | ICD-10-CM | POA: Diagnosis not present

## 2019-03-03 DIAGNOSIS — G311 Senile degeneration of brain, not elsewhere classified: Secondary | ICD-10-CM | POA: Diagnosis not present

## 2019-03-03 DIAGNOSIS — M069 Rheumatoid arthritis, unspecified: Secondary | ICD-10-CM | POA: Diagnosis not present

## 2019-03-03 DIAGNOSIS — R413 Other amnesia: Secondary | ICD-10-CM | POA: Diagnosis not present

## 2019-03-03 DIAGNOSIS — Z9010 Acquired absence of unspecified breast and nipple: Secondary | ICD-10-CM | POA: Diagnosis not present

## 2019-03-06 DIAGNOSIS — G311 Senile degeneration of brain, not elsewhere classified: Secondary | ICD-10-CM | POA: Diagnosis not present

## 2019-03-06 DIAGNOSIS — F411 Generalized anxiety disorder: Secondary | ICD-10-CM | POA: Diagnosis not present

## 2019-03-06 DIAGNOSIS — E785 Hyperlipidemia, unspecified: Secondary | ICD-10-CM | POA: Diagnosis not present

## 2019-03-06 DIAGNOSIS — M069 Rheumatoid arthritis, unspecified: Secondary | ICD-10-CM | POA: Diagnosis not present

## 2019-03-06 DIAGNOSIS — R413 Other amnesia: Secondary | ICD-10-CM | POA: Diagnosis not present

## 2019-03-06 DIAGNOSIS — Z9010 Acquired absence of unspecified breast and nipple: Secondary | ICD-10-CM | POA: Diagnosis not present

## 2019-03-15 DIAGNOSIS — E785 Hyperlipidemia, unspecified: Secondary | ICD-10-CM | POA: Diagnosis not present

## 2019-03-15 DIAGNOSIS — I10 Essential (primary) hypertension: Secondary | ICD-10-CM | POA: Diagnosis not present

## 2019-03-15 DIAGNOSIS — F0281 Dementia in other diseases classified elsewhere with behavioral disturbance: Secondary | ICD-10-CM | POA: Diagnosis not present

## 2019-03-15 DIAGNOSIS — F0150 Vascular dementia without behavioral disturbance: Secondary | ICD-10-CM | POA: Diagnosis not present

## 2019-03-25 DIAGNOSIS — F0150 Vascular dementia without behavioral disturbance: Secondary | ICD-10-CM | POA: Diagnosis not present

## 2019-05-21 ENCOUNTER — Ambulatory Visit: Payer: Medicare HMO

## 2019-05-26 DIAGNOSIS — F0150 Vascular dementia without behavioral disturbance: Secondary | ICD-10-CM | POA: Diagnosis not present

## 2019-06-06 DIAGNOSIS — H40011 Open angle with borderline findings, low risk, right eye: Secondary | ICD-10-CM | POA: Diagnosis not present

## 2019-06-23 DIAGNOSIS — F0150 Vascular dementia without behavioral disturbance: Secondary | ICD-10-CM | POA: Diagnosis not present

## 2019-07-24 DIAGNOSIS — F0150 Vascular dementia without behavioral disturbance: Secondary | ICD-10-CM | POA: Diagnosis not present

## 2019-08-05 ENCOUNTER — Emergency Department (HOSPITAL_COMMUNITY)

## 2019-08-05 ENCOUNTER — Other Ambulatory Visit: Payer: Self-pay

## 2019-08-05 ENCOUNTER — Encounter (HOSPITAL_COMMUNITY): Payer: Self-pay | Admitting: *Deleted

## 2019-08-05 ENCOUNTER — Emergency Department (HOSPITAL_COMMUNITY)
Admission: EM | Admit: 2019-08-05 | Discharge: 2019-08-05 | Disposition: A | Discharge status: Home / Self Care | Attending: Emergency Medicine | Admitting: Emergency Medicine

## 2019-08-05 DIAGNOSIS — M25551 Pain in right hip: Secondary | ICD-10-CM | POA: Diagnosis not present

## 2019-08-05 DIAGNOSIS — Y999 Unspecified external cause status: Secondary | ICD-10-CM | POA: Insufficient documentation

## 2019-08-05 DIAGNOSIS — Z79899 Other long term (current) drug therapy: Secondary | ICD-10-CM | POA: Diagnosis not present

## 2019-08-05 DIAGNOSIS — M25552 Pain in left hip: Secondary | ICD-10-CM | POA: Diagnosis not present

## 2019-08-05 DIAGNOSIS — R2681 Unsteadiness on feet: Secondary | ICD-10-CM | POA: Insufficient documentation

## 2019-08-05 DIAGNOSIS — W06XXXA Fall from bed, initial encounter: Secondary | ICD-10-CM | POA: Diagnosis not present

## 2019-08-05 DIAGNOSIS — Y92003 Bedroom of unspecified non-institutional (private) residence as the place of occurrence of the external cause: Secondary | ICD-10-CM | POA: Insufficient documentation

## 2019-08-05 DIAGNOSIS — Z853 Personal history of malignant neoplasm of breast: Secondary | ICD-10-CM | POA: Insufficient documentation

## 2019-08-05 DIAGNOSIS — R69 Illness, unspecified: Secondary | ICD-10-CM | POA: Diagnosis not present

## 2019-08-05 DIAGNOSIS — S79912A Unspecified injury of left hip, initial encounter: Secondary | ICD-10-CM | POA: Diagnosis not present

## 2019-08-05 DIAGNOSIS — W19XXXA Unspecified fall, initial encounter: Secondary | ICD-10-CM | POA: Diagnosis not present

## 2019-08-05 DIAGNOSIS — F0390 Unspecified dementia without behavioral disturbance: Secondary | ICD-10-CM | POA: Diagnosis not present

## 2019-08-05 DIAGNOSIS — Y939 Activity, unspecified: Secondary | ICD-10-CM | POA: Insufficient documentation

## 2019-08-05 DIAGNOSIS — M25559 Pain in unspecified hip: Secondary | ICD-10-CM

## 2019-08-05 DIAGNOSIS — S299XXA Unspecified injury of thorax, initial encounter: Secondary | ICD-10-CM | POA: Diagnosis not present

## 2019-08-05 DIAGNOSIS — S79911A Unspecified injury of right hip, initial encounter: Secondary | ICD-10-CM | POA: Diagnosis not present

## 2019-08-05 DIAGNOSIS — R5381 Other malaise: Secondary | ICD-10-CM | POA: Diagnosis not present

## 2019-08-05 LAB — CBC WITH DIFFERENTIAL/PLATELET
Abs Immature Granulocytes: 0.01 10*3/uL (ref 0.00–0.07)
Basophils Absolute: 0 10*3/uL (ref 0.0–0.1)
Basophils Relative: 0 %
Eosinophils Absolute: 0 10*3/uL (ref 0.0–0.5)
Eosinophils Relative: 0 %
HCT: 53.8 % — ABNORMAL HIGH (ref 36.0–46.0)
Hemoglobin: 16.8 g/dL — ABNORMAL HIGH (ref 12.0–15.0)
Immature Granulocytes: 0 %
Lymphocytes Relative: 18 %
Lymphs Abs: 1.3 10*3/uL (ref 0.7–4.0)
MCH: 28.5 pg (ref 26.0–34.0)
MCHC: 31.2 g/dL (ref 30.0–36.0)
MCV: 91.3 fL (ref 80.0–100.0)
Monocytes Absolute: 0.6 10*3/uL (ref 0.1–1.0)
Monocytes Relative: 8 %
Neutro Abs: 5.3 10*3/uL (ref 1.7–7.7)
Neutrophils Relative %: 74 %
Platelets: 194 10*3/uL (ref 150–400)
RBC: 5.89 MIL/uL — ABNORMAL HIGH (ref 3.87–5.11)
RDW: 13.4 % (ref 11.5–15.5)
WBC: 7.2 10*3/uL (ref 4.0–10.5)
nRBC: 0 % (ref 0.0–0.2)

## 2019-08-05 LAB — COMPREHENSIVE METABOLIC PANEL
ALT: 16 U/L (ref 0–44)
AST: 21 U/L (ref 15–41)
Albumin: 4.5 g/dL (ref 3.5–5.0)
Alkaline Phosphatase: 51 U/L (ref 38–126)
Anion gap: 11 (ref 5–15)
BUN: 12 mg/dL (ref 8–23)
CO2: 28 mmol/L (ref 22–32)
Calcium: 9.4 mg/dL (ref 8.9–10.3)
Chloride: 101 mmol/L (ref 98–111)
Creatinine, Ser: 0.78 mg/dL (ref 0.44–1.00)
GFR calc Af Amer: >60 mL/min (ref >60)
GFR calc non Af Amer: >60 mL/min (ref >60)
Glucose, Bld: 108 mg/dL — ABNORMAL HIGH (ref 70–99)
Potassium: 3.9 mmol/L (ref 3.5–5.1)
Sodium: 140 mmol/L (ref 135–145)
Total Bilirubin: 0.7 mg/dL (ref 0.3–1.2)
Total Protein: 8 g/dL (ref 6.5–8.1)

## 2019-08-05 LAB — CK: Total CK: 176 U/L (ref 38–234)

## 2019-08-05 MED ORDER — LORAZEPAM 0.5 MG PO TABS
0.5000 mg | ORAL_TABLET | Freq: Once | ORAL | Status: AC
  Administered 2019-08-05: 0.5 mg via ORAL
  Filled 2019-08-05: qty 1

## 2019-08-05 NOTE — ED Provider Notes (Signed)
[facility] EMERGENCY DEPARTMENT Provider Note   CSN: [id number] Arrival date & time: 08/05/19  [id number]     History Chief Complaint  Patient presents with  . Hip Pain    left    Tonya Cortez is a 79 y.o. female.  HPI  Patient is 79 year old female with a history of dementia formally on donepezil but no longer taking, borderline diabetes, history of breast cancer, history of unsteady gait and vertigo  Patient presents today to the ED after a fall that occurred approximately 5 AM when patient was attempting to get out of her bed.  Patient lives at home with her husband who states that he heard her fall out of the bed but was unable to get the patient to stand up.  Approximately 8 AM the patient's husband called their daughter who came to their house and was also unable to get the patient stand up.  At this point EMS was called and patient was brought to the emergency department.  Daughter who is also the patient's caregiver states that patient is mentating at her baseline.  Apparently patient is able to ambulatewithout a walker.  She does move very slowly has difficulty getting up onto her feet but once on her feet is able to ambulate.  Patient does have a history of unsteady gait however.   LEVEL 5 CAVEAT DT DEMENTIA    Past Medical History:  Diagnosis Date  . Arthritis   . Bell's palsy   . Borderline diabetes   . Cancer (HCC)    breast  . Diverticulosis   . Hypercholesterolemia   . Memory difficulties   . Unsteady gait   . Vertigo     Patient Active Problem List   Diagnosis Date Noted  . Adenomatous polyp 01/02/2014    Past Surgical History:  Procedure Laterality Date  . ABDOMINAL HYSTERECTOMY    . BREAST SURGERY    . COLONOSCOPY  10/29/2008   [id number] examination with some left-sided diverticula/Diminutive cecal polyp, status post cold biopsy removal.  Right colonic mucosa appeared normal. adenomatous  . COLONOSCOPY N/A 01/17/2014   Procedure: COLONOSCOPY;   Surgeon: Daneil Dolin, MD;  Location: [facility];  Service: Endoscopy;  Laterality: N/A;  1115  . MASTECTOMY Left    abnormal mass in breast, non-cancerous per patient.   Marland Kitchen MASTECTOMY       OB History    Gravida  5   Para  5   Term  3   Preterm  2   AB      Living  3     SAB      TAB      Ectopic      Multiple      Live Births              Family History  Problem Relation Age of Onset  . Colon cancer Neg Hx     Social History   Tobacco Use  . Smoking status: Never Smoker  . Smokeless tobacco: Never Used  Substance Use Topics  . Alcohol use: No  . Drug use: No    Home Medications Prior to Admission medications   Medication Sig Start Date End Date Taking? Authorizing Provider  citalopram (CELEXA) 10 MG/5ML suspension Take 10 mg by mouth daily.   Yes [provider]  gabapentin (NEURONTIN) 250 MG/5ML solution Take 250 mg by mouth 3 (three) times daily as needed.   Yes [provider]  Melatonin 1 MG/4ML  LIQD Take 15 mLs by mouth at bedtime as needed.   Yes [provider]    Allergies    Patient has no known allergies.  Review of Systems   Review of Systems  Unable to perform ROS: Dementia    Physical Exam Updated Vital Signs BP 132/80 (BP Location: Right Arm)   Pulse 84   Temp 98.6 F (37 C) (Oral)   Resp 14   Ht 5' (1.524 m)   Wt 68 kg   SpO2 99%   BMI 29.29 kg/m   Physical Exam Vitals and nursing note reviewed.  Constitutional:      General: She is not in acute distress.    Comments: Patient is well-appearing 79 year old female no acute distress.  She is pleasantly demented and unable to answer questions appropriately or follow any commands.  HENT:     Head: Normocephalic and atraumatic.     Comments: No obvious trauma to the head.    Nose: Nose normal.     Mouth/Throat:     Mouth: Mucous membranes are moist.  Eyes:     General: No scleral icterus. Cardiovascular:     Rate and Rhythm: Normal  rate and regular rhythm.     Pulses: Normal pulses.     Heart sounds: Normal heart sounds.  Pulmonary:     Effort: Pulmonary effort is normal. No respiratory distress.     Breath sounds: No wheezing.  Abdominal:     Palpations: Abdomen is soft.     Tenderness: There is no abdominal tenderness.  Musculoskeletal:     Cervical back: Normal range of motion.     Right lower leg: No edema.     Left lower leg: No edema.     Comments: Generalized tenderness to palpation of the left lateral hip with some mild tenderness to palpation of the right anterior hip.  No tenderness to palpation of the thighs, knees, lower legs or feet.  No tenderness to palpation of upper extremities in all.  No clavicular, chest wall or lumbar, thoracic or cervical tenderness to palpation.  Handgrips 5/5 bilaterally.  She is moving all 4 extremities.  Unable to assess strength accurately in lower extremities as patient is pleasantly demented and unable to follow commands.  Skin:    General: Skin is warm and dry.     Capillary Refill: Capillary refill takes less than 2 seconds.  Neurological:     Mental Status: She is alert. Mental status is at baseline.     Comments: Sensation appears to be intact grossly all 4 extremities.  Psychiatric:        Mood and Affect: Mood normal.        Behavior: Behavior normal.     ED Results / Procedures / Treatments   Labs (all labs ordered are listed, but only abnormal results are displayed) Labs Reviewed  CBC WITH DIFFERENTIAL/PLATELET - Abnormal; Notable for the following components:      Result Value   RBC 5.89 (*)    Hemoglobin 16.8 (*)    HCT 53.8 (*)    All other components within normal limits  COMPREHENSIVE METABOLIC PANEL - Abnormal; Notable for the following components:   Glucose, Bld 108 (*)    All other components within normal limits  CK    EKG EKG Interpretation  Date/Time:  Saturday Aug 05 2019 10:53:28 EDT Ventricular Rate:  68 PR Interval:    QRS  Duration: 127 QT Interval:  609 QTC Calculation: 648 R Axis:  63 Text Interpretation: Sinus rhythm Nonspecific intraventricular conduction delay Nonspecific T abnrm, anterolateral leads Interpretation limited secondary to artifact Confirmed by Fredia Sorrow [phone]) on 08/05/2019 10:59:09 AM   Radiology CT Head Wo Contrast  Result Date: 08/05/2019 CLINICAL DATA:  Fall from bed this morning. Dementia. Left hip pain. EXAM: CT HEAD WITHOUT CONTRAST CT CERVICAL SPINE WITHOUT CONTRAST TECHNIQUE: Multidetector CT imaging of the head and cervical spine was performed following the standard protocol without intravenous contrast. Multiplanar CT image reconstructions of the cervical spine were also generated. COMPARISON:  04/23/2017 head CT. FINDINGS: CT HEAD FINDINGS Brain: No evidence of parenchymal hemorrhage or extra-axial fluid collection. No mass lesion, mass effect, or midline shift. No CT evidence of acute infarction. Generalized cerebral volume loss. Nonspecific mild subcortical and periventricular white matter hypodensity, most in keeping with chronic small vessel ischemic change. No ventriculomegaly. Vascular: No acute abnormality. Skull: No evidence of calvarial fracture. Sinuses/Orbits: The visualized paranasal sinuses are essentially clear. Other:  The mastoid air cells are unopacified. CT CERVICAL SPINE FINDINGS Motion degraded scan, limiting assessment. Alignment: Normal cervical lordosis. No facet subluxation. Dens is well positioned between the lateral masses of C1. Skull base and vertebrae: No acute fracture. No primary bone lesion or focal pathologic process. Soft tissues and spinal canal: No prevertebral edema. No visible canal hematoma. 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 Mild-to-moderate bilateral facet arthropathy. Mild degenerative foraminal stenosis bilaterally at C4-5, C5-6 and C6-7. Mild effacement of the  anterior thecal sac by disc osteophyte complexes in the mid to lower cervical spine. Upper chest: No acute abnormality. Other: Visualized mastoid air cells appear clear. No discrete thyroid nodules. No pathologically enlarged cervical nodes. IMPRESSION: 1. No evidence of acute intracranial abnormality. No evidence of calvarial fracture. 2. Generalized cerebral volume loss and mild chronic small vessel ischemic changes in the cerebral white matter. 3. Motion degraded scan, limiting assessment of the cervical spine. No evidence of cervical spine fracture or facet subluxation. 4. Marked multilevel degenerative changes in the cervical spine as detailed. Electronically Signed   By: Ilona Sorrel M.D.   On: 08/05/2019 11:29   CT Cervical Spine Wo Contrast  Result Date: 08/05/2019 CLINICAL DATA:  Fall from bed this morning. Dementia. Left hip pain. EXAM: CT HEAD WITHOUT CONTRAST CT CERVICAL SPINE WITHOUT CONTRAST TECHNIQUE: Multidetector CT imaging of the head and cervical spine was performed following the standard protocol without intravenous contrast. Multiplanar CT image reconstructions of the cervical spine were also generated. COMPARISON:  04/23/2017 head CT. FINDINGS: CT HEAD FINDINGS Brain: No evidence of parenchymal hemorrhage or extra-axial fluid collection. No mass lesion, mass effect, or midline shift. No CT evidence of acute infarction. Generalized cerebral volume loss. Nonspecific mild subcortical and periventricular white matter hypodensity, most in keeping with chronic small vessel ischemic change. No ventriculomegaly. Vascular: No acute abnormality. Skull: No evidence of calvarial fracture. Sinuses/Orbits: The visualized paranasal sinuses are essentially clear. Other:  The mastoid air cells are unopacified. CT CERVICAL SPINE FINDINGS Motion degraded scan, limiting assessment. Alignment: Normal cervical lordosis. No facet subluxation. Dens is well positioned between the lateral masses of C1. Skull base and  vertebrae: No acute fracture. No primary bone lesion or focal pathologic process. Soft tissues and spinal canal: No prevertebral edema. No visible canal hematoma. 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 Mild-to-moderate bilateral facet arthropathy. Mild degenerative foraminal stenosis bilaterally at C4-5, C5-6 and  C6-7. Mild effacement of the anterior thecal sac by disc osteophyte complexes in the mid to lower cervical spine. Upper chest: No acute abnormality. Other: Visualized mastoid air cells appear clear. No discrete thyroid nodules. No pathologically enlarged cervical nodes. IMPRESSION: 1. No evidence of acute intracranial abnormality. No evidence of calvarial fracture. 2. Generalized cerebral volume loss and mild chronic small vessel ischemic changes in the cerebral white matter. 3. Motion degraded scan, limiting assessment of the cervical spine. No evidence of cervical spine fracture or facet subluxation. 4. Marked multilevel degenerative changes in the cervical spine as detailed. Electronically Signed   By: Ilona Sorrel M.D.   On: 08/05/2019 11:29   CT Hip Left Wo Contrast  Result Date: 08/05/2019 CLINICAL DATA:  Hip trauma. Fall from bed. Dementia. EXAM: CT OF THE LEFT HIP WITHOUT CONTRAST TECHNIQUE: Multidetector CT imaging of the left hip was performed according to the standard protocol. Multiplanar CT image reconstructions were also generated. COMPARISON:  Radiographs from 08/05/2019 FINDINGS: Bones/Joint/Cartilage The irregularity along the left greater trochanter is attributable to chronic spurring. No well-defined underlying fracture is visible on CT. No fracture of the regional pelvis is observed. Ligaments Suboptimally assessed by CT. Muscles and Tendons Unremarkable Soft tissues Distal sigmoid colon diverticulosis. IMPRESSION: 1. No fracture or acute subluxation identified. The irregularity along the left greater  trochanter is attributable to chronic spurring. 2. Distal sigmoid colon diverticulosis. Electronically Signed   By: Van Clines M.D.   On: 08/05/2019 14:19   DG Chest Portable 1 View  Result Date: 08/05/2019 CLINICAL DATA:  [place] out of bed today at 0500 hours, LEFT hip pain, dementia EXAM: PORTABLE CHEST 1 VIEW COMPARISON:  05/08/2018 FINDINGS: Upper normal heart size. Atherosclerotic calcification and tortuosity of thoracic aorta. Pulmonary vascularity normal. Mild bibasilar atelectasis. No definite infiltrate, pleural effusion or pneumothorax. Bones demineralized. Surgical clips LEFT axilla. IMPRESSION: Mild bibasilar atelectasis. Electronically Signed   By: Lavonia Dana M.D.   On: 08/05/2019 12:56   DG Hip Unilat W or Wo Pelvis 2-3 Views Left  Result Date: 08/05/2019 CLINICAL DATA:  Fall from bed today with left hip pain. EXAM: DG HIP (WITH OR WITHOUT PELVIS) 2-3V LEFT COMPARISON:  None. FINDINGS: No pelvic diastasis. Mild cortical irregularity at the lateral aspect of the left greater trochanter. No left hip dislocation. No suspicious focal osseous lesions. Mild degenerative changes in the visualized lower lumbar spine. IMPRESSION: Mild cortical irregularity at the lateral aspect of the left greater trochanter, equivocal for nondisplaced greater trochanter fracture versus degenerative change. Consider dedicated left hip CT. No left hip dislocation. Electronically Signed   By: Ilona Sorrel M.D.   On: 08/05/2019 11:34   DG Hip Unilat W or Wo Pelvis 2-3 Views Right  Result Date: 08/05/2019 CLINICAL DATA:  Fall from bed this morning. Dementia. EXAM: DG HIP (WITH OR WITHOUT PELVIS) 2-3V RIGHT COMPARISON:  None. FINDINGS: No right hip fracture or dislocation. Mild osteoarthritis in the right hip joint. No suspicious focal osseous lesions. IMPRESSION: No right hip fracture or dislocation. Mild right hip osteoarthritis. Please see the separate concurrent left hip radiograph report for details in the left  hip. Electronically Signed   By: Ilona Sorrel M.D.   On: 08/05/2019 11:36    Procedures Procedures (including critical care time)  Medications Ordered in ED Medications  LORazepam (ATIVAN) tablet 0.5 mg (0.5 mg Oral Given 08/05/19 1158)    ED Course  I have reviewed the triage vital signs and the nursing notes.  Pertinent labs &  imaging results that were available during my care of the patient were reviewed by me and considered in my medical decision making (see chart for details).  Patient is 79 year old female with history of dementia presented today for evaluation after fall that occurred earlier today.  Per daughter/caregiver at bedside she is brought in because she was having difficulty walking.  Physical exam patient has notable left hip tenderness to palpation.  Has no other significant notable tenderness.  Difficult to evaluate patient secondary to dementia.  Unable to obtain full neuro exam.  As result of this I discussed the work-up with my attending physician.  We will obtain chest x-ray, head CT, C-spine, and bilateral hip/pelvis x-ray as well as CBC/CMP and CK as patient was down for a long period of time.  Clinical Course as of Aug 04 1699  Sat Aug 05, 2019  1314 Chest x-ray independently viewed myself.  There is no evidence of infiltrate, or other cardiopulmonary abnormality.  There are scant atelectasis in both bases likely secondary to poor inspiratory effort.   [WF]  1316 ChangesCT head and C-spine independently reviewed by myself.  Agree with radiologist read that there is no acute abnormality.    [WF]  1317 Right hip without any acute abnormality on plain film.   [WF]  1317 Left hip shows questionable fracture.  Will obtain CT noncontrast of left hip to evaluate for hairline fracture.   [WF]  1412 CBC without acute abnormality.  Patient may be a mildly dehydrated as she does have somewhat elevated hemoglobin.  CMP without any acute abnormality.  CK is within normal  limits doubt rhabdomyolysis.  Vital signs remained within normal limits.   [WF]    Clinical Course User Index [WF] Tedd Sias, [place]   MDM Rules/Calculators/A&P                      Patient reassessed after imaging studies.  She is well-appearing and was actually asleep in bed.  CT scan shows no evidence of hairline fracture-after questionable x-ray-will discharge at this time with close follow-up with PCP.  She is given return precautions.  She was able to ambulate without difficulty by the nurse in the hallway prior to discharge.  Final Clinical Impression(s) / ED Diagnoses Final diagnoses:  Hip pain  Fall, initial encounter    Rx / DC Orders ED Discharge Orders    None       Tedd Sias, [place] 08/05/19 1701    Fredia Sorrow, MD 08/06/19 [phone]

## 2019-08-05 NOTE — Discharge Instructions (Addendum)
Please follow-up with primary care doctor as you normally would, please return to ED if she has any new or concerning symptoms.  Please rest and ambulate with care.  Gentle exercises and gentle stretching is encouraged.  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  This will likely cause some pain after the fall which may continue for some time.  Please follow-up with Dr. Criss Rosales.

## 2019-08-05 NOTE — ED Provider Notes (Signed)
Medical screening examination/treatment/procedure(s) were conducted as a shared visit with non-physician practitioner(s) and myself.  I personally evaluated the patient during the encounter.  EKG Interpretation  Date/Time:  Saturday Aug 05 2019 10:53:28 EDT Ventricular Rate:  68 PR Interval:    QRS Duration: 127 QT Interval:  609 QTC Calculation: 648 R Axis:   63 Text Interpretation: Sinus rhythm Nonspecific intraventricular conduction delay Nonspecific T abnrm, anterolateral leads Interpretation limited secondary to artifact Confirmed by Fredia Sorrow [phone]) on 08/05/2019 10:59:09 AM  Patient seen by me along with physician assistant.  Patient daughter lives with her.  She is normally able to walk does have some degree of confusion daughter says is baseline.  They found her on the floor this morning she was not able to get up on her own.  Even with assistance she would not bear weight on the left leg.  She also seemed very shaky on her feet.  Family brought her in for concern.  She probably had the fall around 5 in the morning.  And she was laying on the floor for an extended period of time.  Patient only with complaint of left hip pain.  But she will move both legs spontaneously.  No evidence of any leg shortening or evidence of any significant tenderness with range of motion of both hips.  Patient will have CT head neck go ahead and throwing a chest x-ray because were not exactly sure why she had the fall.  And x-rays of pelvis and both hips.  If no answer and patient still refuses to weight-bear on the left will probably do CT left hip just to rule out hairline fracture.  Lab work-up including CK are all pending.   Fredia Sorrow, MD 08/05/19 1110

## 2019-08-05 NOTE — ED Triage Notes (Signed)
Patient presents to the ED after a fall out of her bed around 0500 today.  Patient complains of left hip pain.  Patient has history of dementia and is accompanied by her caregiver daughter.

## 2019-08-23 DIAGNOSIS — F0150 Vascular dementia without behavioral disturbance: Secondary | ICD-10-CM | POA: Diagnosis not present

## 2019-09-23 DIAGNOSIS — F0150 Vascular dementia without behavioral disturbance: Secondary | ICD-10-CM | POA: Diagnosis not present

## 2019-10-16 ENCOUNTER — Other Ambulatory Visit: Payer: Self-pay

## 2019-10-16 ENCOUNTER — Observation Stay (HOSPITAL_COMMUNITY): Payer: Medicare Other | Admitting: Anesthesiology

## 2019-10-16 ENCOUNTER — Encounter: Payer: Self-pay | Admitting: Internal Medicine

## 2019-10-16 ENCOUNTER — Encounter (HOSPITAL_COMMUNITY): Payer: Self-pay | Admitting: Emergency Medicine

## 2019-10-16 ENCOUNTER — Encounter (HOSPITAL_COMMUNITY)
Admission: EM | Disposition: A | Discharge status: Home / Self Care | Payer: Self-pay | Source: Home / Self Care | Attending: Emergency Medicine

## 2019-10-16 ENCOUNTER — Observation Stay (HOSPITAL_COMMUNITY)
Admission: EM | Admit: 2019-10-16 | Discharge: 2019-10-17 | Disposition: A | Discharge status: Home / Self Care | Payer: Medicare Other | Attending: Family Medicine | Admitting: Family Medicine

## 2019-10-16 ENCOUNTER — Observation Stay (HOSPITAL_COMMUNITY): Payer: Medicare Other

## 2019-10-16 DIAGNOSIS — R920 Mammographic microcalcification found on diagnostic imaging of breast: Secondary | ICD-10-CM | POA: Diagnosis not present

## 2019-10-16 DIAGNOSIS — K209 Esophagitis, unspecified without bleeding: Secondary | ICD-10-CM | POA: Diagnosis not present

## 2019-10-16 DIAGNOSIS — K920 Hematemesis: Secondary | ICD-10-CM

## 2019-10-16 DIAGNOSIS — R1319 Other dysphagia: Secondary | ICD-10-CM

## 2019-10-16 DIAGNOSIS — F0391 Unspecified dementia with behavioral disturbance: Secondary | ICD-10-CM

## 2019-10-16 DIAGNOSIS — E7800 Pure hypercholesterolemia, unspecified: Secondary | ICD-10-CM | POA: Diagnosis not present

## 2019-10-16 DIAGNOSIS — R2681 Unsteadiness on feet: Secondary | ICD-10-CM | POA: Insufficient documentation

## 2019-10-16 DIAGNOSIS — R1110 Vomiting, unspecified: Secondary | ICD-10-CM | POA: Diagnosis not present

## 2019-10-16 DIAGNOSIS — Z853 Personal history of malignant neoplasm of breast: Secondary | ICD-10-CM | POA: Diagnosis not present

## 2019-10-16 DIAGNOSIS — D45 Polycythemia vera: Secondary | ICD-10-CM | POA: Diagnosis not present

## 2019-10-16 DIAGNOSIS — R112 Nausea with vomiting, unspecified: Secondary | ICD-10-CM

## 2019-10-16 DIAGNOSIS — R1111 Vomiting without nausea: Secondary | ICD-10-CM | POA: Diagnosis not present

## 2019-10-16 DIAGNOSIS — M1990 Unspecified osteoarthritis, unspecified site: Secondary | ICD-10-CM | POA: Diagnosis not present

## 2019-10-16 DIAGNOSIS — Z20822 Contact with and (suspected) exposure to covid-19: Secondary | ICD-10-CM | POA: Insufficient documentation

## 2019-10-16 DIAGNOSIS — F0390 Unspecified dementia without behavioral disturbance: Secondary | ICD-10-CM | POA: Insufficient documentation

## 2019-10-16 DIAGNOSIS — K449 Diaphragmatic hernia without obstruction or gangrene: Secondary | ICD-10-CM | POA: Insufficient documentation

## 2019-10-16 DIAGNOSIS — R4182 Altered mental status, unspecified: Secondary | ICD-10-CM | POA: Diagnosis not present

## 2019-10-16 DIAGNOSIS — R0902 Hypoxemia: Secondary | ICD-10-CM | POA: Diagnosis not present

## 2019-10-16 DIAGNOSIS — D751 Secondary polycythemia: Secondary | ICD-10-CM | POA: Diagnosis present

## 2019-10-16 DIAGNOSIS — K222 Esophageal obstruction: Secondary | ICD-10-CM | POA: Diagnosis not present

## 2019-10-16 DIAGNOSIS — R1310 Dysphagia, unspecified: Principal | ICD-10-CM | POA: Insufficient documentation

## 2019-10-16 DIAGNOSIS — Z79899 Other long term (current) drug therapy: Secondary | ICD-10-CM | POA: Insufficient documentation

## 2019-10-16 HISTORY — PX: ESOPHAGOGASTRODUODENOSCOPY (EGD) WITH PROPOFOL: SHX5813

## 2019-10-16 HISTORY — DX: Unspecified dementia, unspecified severity, without behavioral disturbance, psychotic disturbance, mood disturbance, and anxiety: F03.90

## 2019-10-16 HISTORY — PX: ESOPHAGEAL DILATION: SHX303

## 2019-10-16 LAB — CBC WITH DIFFERENTIAL/PLATELET
Abs Immature Granulocytes: 0.02 10*3/uL (ref 0.00–0.07)
Basophils Absolute: 0 10*3/uL (ref 0.0–0.1)
Basophils Relative: 0 %
Eosinophils Absolute: 0 10*3/uL (ref 0.0–0.5)
Eosinophils Relative: 0 %
HCT: 48 % — ABNORMAL HIGH (ref 36.0–46.0)
Hemoglobin: 15.6 g/dL — ABNORMAL HIGH (ref 12.0–15.0)
Immature Granulocytes: 0 %
Lymphocytes Relative: 14 %
Lymphs Abs: 1.1 10*3/uL (ref 0.7–4.0)
MCH: 29.3 pg (ref 26.0–34.0)
MCHC: 32.5 g/dL (ref 30.0–36.0)
MCV: 90.2 fL (ref 80.0–100.0)
Monocytes Absolute: 0.7 10*3/uL (ref 0.1–1.0)
Monocytes Relative: 8 %
Neutro Abs: 6.3 10*3/uL (ref 1.7–7.7)
Neutrophils Relative %: 78 %
Platelets: 199 10*3/uL (ref 150–400)
RBC: 5.32 MIL/uL — ABNORMAL HIGH (ref 3.87–5.11)
RDW: 13.7 % (ref 11.5–15.5)
WBC: 8.1 10*3/uL (ref 4.0–10.5)
nRBC: 0 % (ref 0.0–0.2)

## 2019-10-16 LAB — COMPREHENSIVE METABOLIC PANEL
ALT: 16 U/L (ref 0–44)
AST: 22 U/L (ref 15–41)
Albumin: 4 g/dL (ref 3.5–5.0)
Alkaline Phosphatase: 53 U/L (ref 38–126)
Anion gap: 10 (ref 5–15)
BUN: 12 mg/dL (ref 8–23)
CO2: 27 mmol/L (ref 22–32)
Calcium: 8.9 mg/dL (ref 8.9–10.3)
Chloride: 102 mmol/L (ref 98–111)
Creatinine, Ser: 0.79 mg/dL (ref 0.44–1.00)
GFR calc Af Amer: >60 mL/min (ref >60)
GFR calc non Af Amer: >60 mL/min (ref >60)
Glucose, Bld: 115 mg/dL — ABNORMAL HIGH (ref 70–99)
Potassium: 3.6 mmol/L (ref 3.5–5.1)
Sodium: 139 mmol/L (ref 135–145)
Total Bilirubin: 0.9 mg/dL (ref 0.3–1.2)
Total Protein: 7.8 g/dL (ref 6.5–8.1)

## 2019-10-16 LAB — APTT: aPTT: 27 seconds (ref 24–36)

## 2019-10-16 LAB — SAMPLE TO BLOOD BANK

## 2019-10-16 LAB — PROTIME-INR
INR: 1 (ref 0.8–1.2)
Prothrombin Time: 12.5 seconds (ref 11.4–15.2)

## 2019-10-16 LAB — POC OCCULT BLOOD, ED: Fecal Occult Bld: NEGATIVE

## 2019-10-16 LAB — HEMOGLOBIN: Hemoglobin: 14 g/dL (ref 12.0–15.0)

## 2019-10-16 LAB — SARS CORONAVIRUS 2 BY RT PCR (HOSPITAL ORDER, PERFORMED IN ~~LOC~~ HOSPITAL LAB): SARS Coronavirus 2: NEGATIVE

## 2019-10-16 SURGERY — ESOPHAGOGASTRODUODENOSCOPY (EGD) WITH PROPOFOL
Anesthesia: General

## 2019-10-16 MED ORDER — ACETAMINOPHEN 325 MG PO TABS: 650.0000 mg | ORAL_TABLET | Freq: Four times a day (QID) | ORAL | Status: DC | PRN

## 2019-10-16 MED ORDER — LORAZEPAM 2 MG/ML IJ SOLN: 0.5000 mg | INTRAMUSCULAR | Status: DC | PRN

## 2019-10-16 MED ORDER — PANTOPRAZOLE SODIUM 40 MG IV SOLR: 40.0000 mg | Freq: Two times a day (BID) | INTRAVENOUS | Status: DC

## 2019-10-16 MED ORDER — ACETAMINOPHEN 650 MG RE SUPP: 650.0000 mg | Freq: Four times a day (QID) | RECTAL | Status: DC | PRN

## 2019-10-16 MED ORDER — LORAZEPAM 2 MG/ML IJ SOLN
0.5000 mg | Freq: Once | INTRAMUSCULAR | Status: AC
  Administered 2019-10-16: 0.5 mg via INTRAVENOUS
  Filled 2019-10-16: qty 1

## 2019-10-16 MED ORDER — ONDANSETRON HCL 4 MG/2ML IJ SOLN: 4.0000 mg | Freq: Four times a day (QID) | INTRAMUSCULAR | Status: DC | PRN

## 2019-10-16 MED ORDER — CITALOPRAM HYDROBROMIDE 10 MG/5ML PO SOLN: 20.0000 mg | Freq: Every day | ORAL | Status: DC

## 2019-10-16 MED ORDER — LIDOCAINE HCL (CARDIAC) PF 100 MG/5ML IV SOSY
PREFILLED_SYRINGE | INTRAVENOUS | Status: DC | PRN
  Administered 2019-10-16: 40 mg via INTRAVENOUS

## 2019-10-16 MED ORDER — PROPOFOL 500 MG/50ML IV EMUL
INTRAVENOUS | Status: DC | PRN
  Administered 2019-10-16: 125 ug/kg/min via INTRAVENOUS

## 2019-10-16 MED ORDER — SODIUM CHLORIDE 0.9 % IV SOLN: INTRAVENOUS | Status: DC

## 2019-10-16 MED ORDER — SODIUM CHLORIDE 0.9 % IV BOLUS
500.0000 mL | Freq: Once | INTRAVENOUS | Status: AC
  Administered 2019-10-16: 500 mL via INTRAVENOUS

## 2019-10-16 MED ORDER — CITALOPRAM HYDROBROMIDE 10 MG/5ML PO SOLN
10.0000 mg | Freq: Every day | ORAL | Status: DC
  Administered 2019-10-17: 10 mg via ORAL
  Filled 2019-10-16 (×3): qty 10

## 2019-10-16 MED ORDER — SODIUM CHLORIDE 0.9 % IV SOLN
8.0000 mg/h | INTRAVENOUS | Status: DC
  Administered 2019-10-16: 8 mg/h via INTRAVENOUS
  Filled 2019-10-16 (×6): qty 80

## 2019-10-16 MED ORDER — CITALOPRAM HYDROBROMIDE 10 MG PO TABS: 10.0000 mg | ORAL_TABLET | Freq: Every day | ORAL | Status: DC

## 2019-10-16 MED ORDER — PROPOFOL 10 MG/ML IV BOLUS
INTRAVENOUS | Status: DC | PRN
  Administered 2019-10-16 (×3): 20 mg via INTRAVENOUS

## 2019-10-16 MED ORDER — ONDANSETRON HCL 4 MG PO TABS: 4.0000 mg | ORAL_TABLET | Freq: Four times a day (QID) | ORAL | Status: DC | PRN

## 2019-10-16 MED ORDER — PROPOFOL 10 MG/ML IV BOLUS
INTRAVENOUS | Status: AC
  Filled 2019-10-16: qty 40

## 2019-10-16 MED ORDER — MELATONIN 3 MG PO TABS: 3.0000 mg | ORAL_TABLET | Freq: Every evening | ORAL | Status: DC | PRN

## 2019-10-16 MED ORDER — LACTATED RINGERS IV SOLN
INTRAVENOUS | Status: DC
  Administered 2019-10-16: 1000 mL via INTRAVENOUS

## 2019-10-16 MED ORDER — GLYCOPYRROLATE 0.2 MG/ML IJ SOLN
0.2000 mg | Freq: Once | INTRAMUSCULAR | Status: AC
  Administered 2019-10-16: 0.2 mg via INTRAVENOUS
  Filled 2019-10-16: qty 1

## 2019-10-16 MED ORDER — SODIUM CHLORIDE 0.9 % IV SOLN
80.0000 mg | Freq: Once | INTRAVENOUS | Status: AC
  Administered 2019-10-16: 80 mg via INTRAVENOUS
  Filled 2019-10-16: qty 80

## 2019-10-16 MED ORDER — PANTOPRAZOLE SODIUM 40 MG IV SOLR
INTRAVENOUS | Status: AC
  Filled 2019-10-16: qty 160

## 2019-10-16 NOTE — Anesthesia Preprocedure Evaluation (Signed)
Anesthesia Evaluation  Patient identified by MRN, date of birth, ID band Patient awake    Reviewed: Allergy & Precautions, H&P , NPO status , Patient's Chart, lab work & pertinent test results, reviewed documented beta blocker date and time   Airway Mallampati: II  TM Distance: >3 FB Neck ROM: full    Dental no notable dental hx.    Pulmonary neg pulmonary ROS,    Pulmonary exam normal breath sounds clear to auscultation       Cardiovascular Exercise Tolerance: Good negative cardio ROS   Rhythm:regular Rate:Normal     Neuro/Psych PSYCHIATRIC DISORDERS Dementia  Neuromuscular disease    GI/Hepatic negative GI ROS, Neg liver ROS,   Endo/Other  negative endocrine ROS  Renal/GU negative Renal ROS  negative genitourinary   Musculoskeletal   Abdominal   Peds  Hematology negative hematology ROS (+)   Anesthesia Other Findings   Reproductive/Obstetrics negative OB ROS                             Anesthesia Physical Anesthesia Plan  ASA: III  Anesthesia Plan: General   Post-op Pain Management:    Induction:   PONV Risk Score and Plan: Propofol infusion  Airway Management Planned:   Additional Equipment:   Intra-op Plan:   Post-operative Plan:   Informed Consent: I have reviewed the patients History and Physical, chart, labs and discussed the procedure including the risks, benefits and alternatives for the proposed anesthesia with the patient or authorized representative who has indicated his/her understanding and acceptance.     Dental Advisory Given  Plan Discussed with: CRNA  Anesthesia Plan Comments:         Anesthesia Quick Evaluation

## 2019-10-16 NOTE — Progress Notes (Signed)
ASSUMPTION OF CARE NOTE   10/16/2019 9:52 AM  Tonya Cortez was seen and examined.  The H&P by the admitting provider, orders, imaging was reviewed.  Please see new orders.  Will continue to follow.   Pt was admitted with recurrent episodes of hematemesis and had some melena last week according to daughter. She has been started on an IV protonix infusion and GI consultation has been requested.   I have consulted TOC team to investigate if she is on home hospice care.    Vitals:   10/16/19 0530 10/16/19 0706  BP: 119/85 110/61  Pulse: 75 81  Resp: 18 20  Temp:  99 F (37.2 C)  SpO2: 96%     Results for orders placed or performed during the hospital encounter of 10/16/19  SARS Coronavirus 2 by RT PCR (hospital order, performed in [facility] hospital lab) Nasopharyngeal Nasopharyngeal Swab   Specimen: Nasopharyngeal Swab  Result Value Ref Range   SARS Coronavirus 2 NEGATIVE NEGATIVE  Comprehensive metabolic panel  Result Value Ref Range   Sodium 139 135 - 145 mmol/L   Potassium 3.6 3.5 - 5.1 mmol/L   Chloride 102 98 - 111 mmol/L   CO2 27 22 - 32 mmol/L   Glucose, Bld 115 (H) 70 - 99 mg/dL   BUN 12 8 - 23 mg/dL   Creatinine, Ser 0.79 0.44 - 1.00 mg/dL   Calcium 8.9 8.9 - 10.3 mg/dL   Total Protein 7.8 6.5 - 8.1 g/dL   Albumin 4.0 3.5 - 5.0 g/dL   AST 22 15 - 41 U/L   ALT 16 0 - 44 U/L   Alkaline Phosphatase 53 38 - 126 U/L   Total Bilirubin 0.9 0.3 - 1.2 mg/dL   GFR calc non Af Amer >60 >60 mL/min   GFR calc Af Amer >60 >60 mL/min   Anion gap 10 5 - 15  CBC with Differential  Result Value Ref Range   WBC 8.1 4.0 - 10.5 K/uL   RBC 5.32 (H) 3.87 - 5.11 MIL/uL   Hemoglobin 15.6 (H) 12.0 - 15.0 g/dL   HCT 48.0 (H) 36 - 46 %   MCV 90.2 80.0 - 100.0 fL   MCH 29.3 26.0 - 34.0 pg   MCHC 32.5 30.0 - 36.0 g/dL   RDW 13.7 11.5 - 15.5 %   Platelets 199 150 - 400 K/uL   nRBC 0.0 0.0 - 0.2 %   Neutrophils Relative % 78 %   Neutro Abs 6.3 1.7 - 7.7 K/uL   Lymphocytes Relative  14 %   Lymphs Abs 1.1 0.7 - 4.0 K/uL   Monocytes Relative 8 %   Monocytes Absolute 0.7 0 - 1 K/uL   Eosinophils Relative 0 %   Eosinophils Absolute 0.0 0 - 0 K/uL   Basophils Relative 0 %   Basophils Absolute 0.0 0 - 0 K/uL   Immature Granulocytes 0 %   Abs Immature Granulocytes 0.02 0.00 - 0.07 K/uL  Protime-INR  Result Value Ref Range   Prothrombin Time 12.5 11.4 - 15.2 seconds   INR 1.0 0.8 - 1.2  APTT  Result Value Ref Range   aPTT 27 24 - 36 seconds  POC occult blood, ED RN will collect  Result Value Ref Range   Fecal Occult Bld NEGATIVE NEGATIVE  Sample to Blood Bank  Result Value Ref Range   Blood Bank Specimen SAMPLE AVAILABLE FOR TESTING    Sample Expiration      10/17/2019,2359 Performed at  [facility], [address], [place],  [address]    Time spent: 30 minutes   Murvin Natal, MD [facility]   10/16/2019  2:17 AM How to contact the [facility] Attending or Consulting provider [place] or covering provider during after hours [place], for this patient?  1. Check the care team in [facility] and look for a) attending/consulting TRH provider listed and b) the [facility] team listed 2. Log into [email or web address] and use 's universal password to access. If you do not have the password, please contact the hospital operator. 3. Locate the [facility] provider you are looking for under [facility] and page to a number that you can be directly reached. 4. If you still have difficulty reaching the provider, please page the [facility] (Director on Call) for the Hospitalists listed on amion for assistance.

## 2019-10-16 NOTE — ED Triage Notes (Signed)
Patient brought in by EMS for emesis that started tonight. Patient has a history of dementia. Patient would not let EMS start an IV due to dementia. BP 124/72.

## 2019-10-16 NOTE — ED Provider Notes (Signed)
[facility] Provider Note   CSN: [id number] Arrival date & time: 10/16/19  0216   Time seen 2:37 AM  History Chief Complaint  Patient presents with  . Emesis   Level 5 caveat for dementia  Tonya Cortez is a 79 y.o. female.  HPI   When I go in the room patient is by herself, she states "I feel okay" and when I asked her if she has nausea she states no.  Nurses report that she had vomitus on her clothes that looked bloody.  Daughter arrived shortly afterwards and states the patient started vomiting about an hour ago and vomited 3 times and all 3 times it was bloody.  The patient has not been complaining of abdominal pain.  She states that on July 11 she did not eat as much is normal and the hospitalist checked her out and did not find anything acute.  Daughter states her last bowel movement was July 10 and it was black and she shows me a picture showing it was very black.  She has never had these symptoms before, she has not had a history of stomach ulcers in the past.  Daughter states if she needs blood she wants her to get it.  PCP Lucianne Lei, MD   Past Medical History:  Diagnosis Date  . Arthritis   . Bell's palsy   . Borderline diabetes   . Cancer (HCC)    breast  . Diverticulosis   . Hypercholesterolemia   . Memory difficulties   . Unsteady gait   . Vertigo     Patient Active Problem List   Diagnosis Date Noted  . Emesis 10/16/2019  . Dementia ([place]) 10/16/2019  . Polycythemia 10/16/2019  . Adenomatous polyp 01/02/2014    Past Surgical History:  Procedure Laterality Date  . ABDOMINAL HYSTERECTOMY    . BREAST SURGERY    . COLONOSCOPY  10/29/2008   HYI:FOYDXA examination with some left-sided diverticula/Diminutive cecal polyp, status post cold biopsy removal.  Right colonic mucosa appeared normal. adenomatous  . COLONOSCOPY N/A 01/17/2014   Procedure: COLONOSCOPY;  Surgeon: Daneil Dolin, MD;  [facility];  Service:  Endoscopy;  Laterality: N/A;  1115  . MASTECTOMY Left    abnormal mass in breast, non-cancerous per patient.   Marland Kitchen MASTECTOMY       OB History    Gravida  5   Para  5   Term  3   Preterm  2   AB      Living  3     SAB      TAB      Ectopic      Multiple      Live Births              Family History  Problem Relation Age of Onset  . Colon cancer Neg Hx     Social History   Tobacco Use  . Smoking status: Never Smoker  . Smokeless tobacco: Never Used  Substance Use Topics  . Alcohol use: No  . Drug use: No  Patient lives at home, daughter states she comes over to take care of her during the day and at night  Home Medications Prior to Admission medications   Medication Sig Start Date End Date Taking? Authorizing Provider  citalopram (CELEXA) 10 MG/5ML suspension Take 10 mg by mouth daily.    [provider]  gabapentin (NEURONTIN) 250 MG/5ML solution Take 250 mg by mouth 3 (  three) times daily as needed.    [provider]  Melatonin 1 MG/4ML LIQD Take 15 mLs by mouth at bedtime as needed.    [provider]    Allergies    Patient has no known allergies.  Review of Systems   Review of Systems  Unable to perform ROS: Dementia    Physical Exam Updated Vital Signs BP 119/85   Pulse 75   Temp 98.6 F (37 C) (Oral)   Resp 18   Ht 5\' 4"  (1.626 m)   Wt 72.6 kg   SpO2 93%   BMI 27.46 kg/m   Physical Exam Vitals and nursing note reviewed.  Constitutional:      Appearance: Normal appearance.     Comments: Patient keeps her eyes closed, when I walked in the room she is talking to herself stating "I feel okay".  HENT:     Head: Normocephalic and atraumatic.  Eyes:     Comments: Patient refuses to open her eyes on command  Cardiovascular:     Rate and Rhythm: Normal rate and regular rhythm.     Pulses: Normal pulses.  Pulmonary:     Effort: Pulmonary effort is normal.     Breath sounds: Normal breath sounds.    Abdominal:     General: Abdomen is flat. Bowel sounds are normal.     Palpations: Abdomen is soft.  Musculoskeletal:        General: No deformity.     Cervical back: Normal range of motion.  Skin:    General: Skin is warm and dry.  Neurological:     Comments: Patient does not follow any commands, she is moving both extremities and trying to remove her IV in her left hand.  Psychiatric:        Speech: Speech is delayed.        Behavior: Behavior is uncooperative.     ED Results / Procedures / Treatments   Labs (all labs ordered are listed, but only abnormal results are displayed) Results for orders placed or performed during the hospital encounter of 10/16/19  Comprehensive metabolic panel  Result Value Ref Range   Sodium 139 135 - 145 mmol/L   Potassium 3.6 3.5 - 5.1 mmol/L   Chloride 102 98 - 111 mmol/L   CO2 27 22 - 32 mmol/L   Glucose, Bld 115 (H) 70 - 99 mg/dL   BUN 12 8 - 23 mg/dL   Creatinine, Ser 0.79 0.44 - 1.00 mg/dL   Calcium 8.9 8.9 - 10.3 mg/dL   Total Protein 7.8 6.5 - 8.1 g/dL   Albumin 4.0 3.5 - 5.0 g/dL   AST 22 15 - 41 U/L   ALT 16 0 - 44 U/L   Alkaline Phosphatase 53 38 - 126 U/L   Total Bilirubin 0.9 0.3 - 1.2 mg/dL   GFR calc non Af Amer >60 >60 mL/min   GFR calc Af Amer >60 >60 mL/min   Anion gap 10 5 - 15  CBC with Differential  Result Value Ref Range   WBC 8.1 4.0 - 10.5 K/uL   RBC 5.32 (H) 3.87 - 5.11 MIL/uL   Hemoglobin 15.6 (H) 12.0 - 15.0 g/dL   HCT 48.0 (H) 36 - 46 %   MCV 90.2 80.0 - 100.0 fL   MCH 29.3 26.0 - 34.0 pg   MCHC 32.5 30.0 - 36.0 g/dL   RDW 13.7 11.5 - 15.5 %   Platelets 199 150 - 400 K/uL  nRBC 0.0 0.0 - 0.2 %   Neutrophils Relative % 78 %   Neutro Abs 6.3 1.7 - 7.7 K/uL   Lymphocytes Relative 14 %   Lymphs Abs 1.1 0.7 - 4.0 K/uL   Monocytes Relative 8 %   Monocytes Absolute 0.7 0 - 1 K/uL   Eosinophils Relative 0 %   Eosinophils Absolute 0.0 0 - 0 K/uL   Basophils Relative 0 %   Basophils Absolute 0.0 0 - 0 K/uL    Immature Granulocytes 0 %   Abs Immature Granulocytes 0.02 0.00 - 0.07 K/uL  Protime-INR  Result Value Ref Range   Prothrombin Time 12.5 11.4 - 15.2 seconds   INR 1.0 0.8 - 1.2  APTT  Result Value Ref Range   aPTT 27 24 - 36 seconds  POC occult blood, ED RN will collect  Result Value Ref Range   Fecal Occult Bld NEGATIVE NEGATIVE  Sample to Blood Bank  Result Value Ref Range   Blood Bank Specimen SAMPLE AVAILABLE FOR TESTING    Sample Expiration      10/17/2019,2359 Performed at [facility], [address], [place], [place] [address]    Laboratory interpretation all normal except nonfasting hyperglycemia    EKG None  Radiology No results found.  Procedures Procedures (including critical care time)  Medications Ordered in ED Medications  pantoprazole (PROTONIX) 80 mg in sodium chloride 0.9 % 100 mL (0.8 mg/mL) infusion (8 mg/hr Intravenous Restarted 10/16/19 0407)  pantoprazole (PROTONIX) injection 40 mg (has no administration in time range)  0.9 %  sodium chloride infusion (has no administration in time range)  sodium chloride 0.9 % bolus 500 mL (0 mLs Intravenous Stopped 10/16/19 0345)  pantoprazole (PROTONIX) 80 mg in sodium chloride 0.9 % 100 mL IVPB (0 mg Intravenous Stopped 10/16/19 0407)    ED Course  I have reviewed the triage vital signs and the nursing notes.  Pertinent labs & imaging results that were available during my care of the patient were reviewed by me and considered in my medical decision making (see chart for details).    MDM Rules/Calculators/A&P                         Laboratory testing was done including Hemoccult.  Nursing staff reports Hemoccult was negative.  Patient was started on Protonix bolus and drip for presumed upper GI bleeding.  Daughter states patient fell on July 10.  She did not have any injury.  We discussed her test results, her hemoglobin is stable at her baseline.  Nursing staff reports the vomitus looked bloody.  We  discussed admission and the daughter is agreeable.  5:03 AM Dr. Olevia Bowens, hospitalist will admit.  Patient has had no further vomiting while in the ED.     Final Clinical Impression(s) / ED Diagnoses Final diagnoses:  Non-intractable vomiting with nausea, unspecified vomiting type  Hematemesis, presence of nausea not specified    Rx / DC Orders  Plan admission  Rolland Porter, MD, Barbette Or, MD 10/16/19 [phone]

## 2019-10-16 NOTE — H&P (Addendum)
History and Physical    Tonya Cortez FAO:[id number] DOB: April 19, 1940 DOA: 10/16/2019  PCP: Tonya Lei, MD   Patient coming from: Home.   I have personally briefly reviewed patient's old medical records in [place]  Chief Complaint: Emesis.  HPI: Tonya Cortez is a 79 y.o. female with medical history significant of osteoarthritis, Bell's palsy, glucose intolerance, breast cancer, diverticulosis, hyperlipidemia, progressively worse memory difficulties/dementia since diagnosed about 6 years ago, unsteady gait, history of vertigo who is brought to the emergency department due to having 3 episodes of emesis, which per patient's daughter looked bloody about an hour before coming to the hospital.  She also had a picture of the patient's last bowel movement on July 10, which looked melanotic.  There is no history of PUD or gastritis.  Her daughter states that she is not taking NSAIDs.  The patient is unable to provide further history, but per the patient's daughter there has not been fever, dyspnea, complains of chest, abdominal or back pain.  She also mentions that the patient's mental status, appetite and level of activity has decreased from baseline in the past 2 days.  ED Course: Initial vital signs were temperature 98.6 F, pulse 101, respiration 18, blood pressure 120/87 mmHg and O2 sat 95% on room air.  The patient was given a 500 mL NS bolus and then pantoprazole 80 mg IVP followed by an 8 mg/h infusion.  CBC shows a white count of 8.1, hemoglobin 15.6 g/dL and platelets 199.  PT, INR and PTT within normal limits.  Her CMP shows a glucose of 115 mg/dL, but all other values are within expected range.  Her BUN level was only 12 mg/dL.  Fecal occult blood was negative.  Review of Systems: As per HPI otherwise all other systems reviewed and are negative.  Past Medical History:  Diagnosis Date  . Arthritis   . Bell's palsy   . Borderline diabetes   . Cancer (HCC)    breast    . Diverticulosis   . Hypercholesterolemia   . Memory difficulties   . Unsteady gait   . Vertigo    Past Surgical History:  Procedure Laterality Date  . ABDOMINAL HYSTERECTOMY    . BREAST SURGERY    . COLONOSCOPY  10/29/2008   ONG:EXBMWU examination with some left-sided diverticula/Diminutive cecal polyp, status post cold biopsy removal.  Right colonic mucosa appeared normal. adenomatous  . COLONOSCOPY N/A 01/17/2014   Procedure: COLONOSCOPY;  Surgeon: Tonya Dolin, MD;  [facility];  Service: Endoscopy;  Laterality: N/A;  1115  . MASTECTOMY Left    abnormal mass in breast, non-cancerous per patient.   Marland Kitchen MASTECTOMY     Social History  reports that she has never smoked. She has never used smokeless tobacco. She reports that she does not drink alcohol and does not use drugs.  No Known Allergies  Family History  Problem Relation Age of Onset  . Colon cancer Neg Hx    Prior to Admission medications   Medication Sig Start Date End Date Taking? Authorizing Provider  citalopram (CELEXA) 10 MG/5ML suspension Take 10 mg by mouth daily.    [provider]  gabapentin (NEURONTIN) 250 MG/5ML solution Take 250 mg by mouth 3 (three) times daily as needed.    [provider]  Melatonin 1 MG/4ML LIQD Take 15 mLs by mouth at bedtime as needed.    [provider]   Physical Exam: Vitals:   10/16/19 0405 10/16/19  0430 10/16/19 0500 10/16/19 0530  BP: (!) 143/70 (!) 141/66 100/65 119/85  Pulse: 74 75 75 75  Resp: 18 18 18 18   Temp:      TempSrc:      SpO2: 98% 93% 93% 96%  Weight:      Height:       Constitutional: Mild restlessness. Eyes: PERRL, lids and conjunctivae normal ENMT: Mucous membranes are mildly dry. Posterior pharynx clear of any exudate or lesions. Neck: normal, supple, no masses, no thyromegaly Respiratory: clear to auscultation bilaterally, no wheezing, no crackles. Normal respiratory effort. No accessory muscle use.   Cardiovascular: Regular rate and rhythm, no murmurs / rubs / gallops. No extremity edema. 2+ pedal pulses. No carotid bruits.  Abdomen: Nondistended.,  BS positive, no tenderness, no masses palpated. No hepatosplenomegaly. Musculoskeletal: no clubbing / cyanosis good ROM, no contractures. Normal muscle tone.  Skin: no rashes, lesions, ulcers on limited dermatological examination. Neurologic: CN 2-12 grossly intact.  Grossly nonfocal on limited neurological exam.. Psychiatric: Anxious, restless.  Responds to name, but is otherwise disoriented.  Labs on Admission: I have personally reviewed following labs and imaging studies  CBC: Recent Labs  Lab 10/16/19 0340  WBC 8.1  NEUTROABS 6.3  HGB 15.6*  HCT 48.0*  MCV 90.2  PLT 326    Basic Metabolic Panel: Recent Labs  Lab 10/16/19 0340  NA 139  K 3.6  CL 102  CO2 27  GLUCOSE 115*  BUN 12  CREATININE 0.79  CALCIUM 8.9    GFR: Estimated Creatinine Clearance: 55.7 mL/min (by C-G formula based on SCr of 0.79 mg/dL).  Liver Function Tests: Recent Labs  Lab 10/16/19 0340  AST 22  ALT 16  ALKPHOS 53  BILITOT 0.9  PROT 7.8  ALBUMIN 4.0   Radiological Exams on Admission: No results found.  EKG: Independently reviewed.  Assessment/Plan Principal Problem:   Emesis Hematemesis? No decrease in hemoglobin. No increase in BUN, but positive signs/symptoms. Observation/telemetry. Keep n.p.o. for now. Continue pantoprazole infusion. Continue NS infusion. Trend hemoglobin level. Consult gastroenterology.  Active Problems:   Dementia (Major) Worsening the last 2 days. Per daughter, she had a fall without significant trauma. Hold citalopram 20 mg p.o. daily while n.p.o. Lorazepam 0.5 mg IVP every 4 hours as needed for restlessness. Will check CT head without contrast. Neurochecks every 4 hours.    Polycythemia This is a chronic issue. Monitor H&H.    Hypercholesterolemia Not on medical therapy.    DVT  prophylaxis: SCDs. Code Status:   Full code. Family Communication:  Her daughter Mariann Laster was with the patient and provided history of Disposition Plan:   Patient is from:  Home.  Anticipated DC to:  Home.  Anticipated DC date:  10/17/2019 or 10/18/2019.  Anticipated DC barriers: Clinical status.  Consults called:  Routine gastroenterology consult. Admission status:  Observation/telemetry.  Severity of Illness:  Medium to high.  Reubin Milan MD [facility]  How to contact the [facility] Attending or Consulting provider [place] or covering provider during after hours [place], for this patient?   1. Check the care team in [facility] and look for a) attending/consulting TRH provider listed and b) the [facility] team listed 2. Log into [email or web address] and use [place]'s universal password to access. If you do not have the password, please contact the hospital operator. 3. Locate the [facility] provider you are looking for under [facility] and page to a number that you can be directly reached. 4. If you  still have difficulty reaching the provider, please page the [facility] (Director on Call) for the Hospitalists listed on amion for assistance.  10/16/2019, 5:55 AM   This document was prepared using Dragon voice recognition software and may contain some unintentional transcription errors.

## 2019-10-16 NOTE — TOC Initial Note (Signed)
Transition of Care [facility]) - Initial/Assessment Note    Patient Details  Name: Tonya Cortez MRN: [id number] Date of Birth: 1940-06-11  Transition of Care [facility]) CM/SW Contact:    Salome Arnt, LCSW Phone Number: 10/16/2019, 1:54 PM  Clinical Narrative:  LCSW spoke with pt's daughter, Mariann Laster to complete assessment. Pt brought to hospital due to emesis. She lives with her husband and Mariann Laster has been staying with them to provide around the clock care for pt. She has been receiving hospice services through [facility] under the diagnosis of dementia. Per Cassandra at [facility], family has requested to revoke hospice services because they want aggressive treatment. Pt has hospital bed, overbed table, and 3N1 through [employer]. LCSW spoke with [facility] and pt may be able to keep equipment although it was ordered through hospice if we have new orders. MD aware. Mariann Laster is also requesting home health RN and aide. MD notified. Will follow to address d/c planning needs.                  Expected Discharge Plan: [place] Barriers to Discharge: Continued Medical Work up   Patient Goals and CMS Choice Patient states their goals for this hospitalization and ongoing recovery are:: return home      Expected Discharge Plan and Services Expected Discharge Plan: [place] In-house Referral: Clinical Social Work   Post Acute Care Choice: [place] arrangements for the past 2 months: [place]                 DME Arranged: Hospital bed, Overbed table, 3-N-1 DME Agency: [place] Date DME Agency Contacted: 10/16/19 Time DME Agency Contacted: [phone]              Prior Living Arrangements/Services Living arrangements for the past 2 months: Single Family Home Lives with:: Spouse, Adult Children Patient language and need for interpreter reviewed::  Yes Do you feel safe going back to the place where you live?: Yes      Need for Family Participation in Patient Care: Yes (Comment) Care giver support system in place?: Yes (comment) Current home services: DME, Hospice (hospital bed, overbed table, 3N1) Criminal Activity/Legal Involvement Pertinent to Current Situation/Hospitalization: No - Comment as needed  Activities of Daily Living Home Assistive Devices/Equipment: Hospital bed, Bedside commode/3-in-1 ADL Screening (condition at time of admission) Patient's cognitive ability adequate to safely complete daily activities?: No Is the patient deaf or have difficulty hearing?: No Does the patient have difficulty seeing, even when wearing glasses/contacts?: No Does the patient have difficulty concentrating, remembering, or making decisions?: Yes Patient able to express need for assistance with ADLs?: No Does the patient have difficulty dressing or bathing?: Yes Independently performs ADLs?: No Communication: Independent Dressing (OT): Needs assistance Is this a change from baseline?: Pre-admission baseline Grooming: Needs assistance Is this a change from baseline?: Pre-admission baseline Feeding: Needs assistance Is this a change from baseline?: Pre-admission baseline Bathing: Dependent Is this a change from baseline?: Pre-admission baseline Toileting: Needs assistance Is this a change from baseline?: Pre-admission baseline In/Out Bed: Needs assistance Is this a change from baseline?: Pre-admission baseline Walks in Home: Needs assistance Is this a change from baseline?: Pre-admission baseline Does the patient have difficulty walking or climbing stairs?: Yes Weakness of Legs: Both Weakness of Arms/Hands: None  Permission Sought/Granted  Emotional Assessment Appearance:: Appears stated age Attitude/Demeanor/Rapport: Unable to Assess Affect (typically observed): Unable to Assess   Alcohol / Substance Use:  Not Applicable Psych Involvement: No (comment)  Admission diagnosis:  Emesis [R11.10] Hematemesis, presence of nausea not specified [K92.0] Non-intractable vomiting with nausea, unspecified vomiting type [R11.2] Patient Active Problem List   Diagnosis Date Noted  . Emesis 10/16/2019  . Dementia ([place]) 10/16/2019  . Polycythemia 10/16/2019  . Hypercholesterolemia   . Adenomatous polyp 01/02/2014   PCP:  Lucianne Lei, MD Pharmacy:   [facility] [address] [place] [address] Phone: [phone] Fax: [phone]     Social Determinants of Health (SDOH) Interventions    Readmission Risk Interventions No flowsheet data found.

## 2019-10-16 NOTE — Anesthesia Postprocedure Evaluation (Signed)
Anesthesia Post Note  Patient: Tonya Cortez  Procedure(s) Performed: ESOPHAGOGASTRODUODENOSCOPY (EGD) WITH PROPOFOL (N/A ) ESOPHAGEAL DILATION  Patient location during evaluation: PACU Anesthesia Type: MAC Level of consciousness: awake Pain management: pain level controlled Vital Signs Assessment: post-procedure vital signs reviewed and stable Respiratory status: spontaneous breathing and respiratory function stable Cardiovascular status: blood pressure returned to baseline and stable Anesthetic complications: no   No complications documented.   Last Vitals:  Vitals:   10/16/19 1611 10/16/19 1615  BP: (!) 90/50 (!) 92/55  Pulse:  93  Resp: 15 18  Temp: 36.6 C   SpO2: 92% 91%    Last Pain:  Vitals:   10/16/19 1503  TempSrc: Oral                 [name] L

## 2019-10-16 NOTE — Op Note (Signed)
[facility] Patient Name: Tonya Cortez Procedure Date: 10/16/2019 3:46 PM MRN: [id number] Date of Birth: [birth year] Attending MD: Norvel Richards , MD CSN: [id number] Age: 80 Admit Type: Inpatient Procedure:                Upper GI endoscopy Indications:              Dysphagia, Coffee-ground emesis Providers:                Norvel Richards, MD, Janeece Riggers, RN, Nelma Rothman, Technician Referring MD:              Medicines:                Propofol per Anesthesia Complications:            No immediate complications. Estimated Blood Loss:     Estimated blood loss was minimal. Estimated blood                            loss was minimal. Procedure:                Pre-Anesthesia Assessment:                           - Prior to the procedure, a History and Physical                            was performed, and patient medications and                            allergies were reviewed. The patient's tolerance of                            previous anesthesia was also reviewed. The risks                            and benefits of the procedure and the sedation                            options and risks were discussed with the patient.                            All questions were answered, and informed consent                            was obtained. Prior Anticoagulants: The patient has                            taken no previous anticoagulant or antiplatelet                            agents. ASA Grade Assessment: III - A patient with  severe systemic disease. After reviewing the risks                            and benefits, the patient was deemed in                            satisfactory condition to undergo the procedure.                           After obtaining informed consent, the endoscope was                            passed under direct vision. Throughout the                            procedure, the  patient's blood pressure, pulse, and                            oxygen saturations were monitored continuously. The                            [id number]-H190 ([id number]) scope was introduced through the                            mouth, and advanced to the second part of duodenum.                            The upper GI endoscopy was accomplished without                            difficulty. The patient tolerated the procedure                            well. Scope In: 3:57:01 PM Scope Out: 4:05:34 PM Total Procedure Duration: 0 hours 8 minutes 33 seconds  Findings:      Esophagitis with no bleeding was found. There were some excoriations       just above a Schatzki's ring. No bleeding stigmata was found.      A medium-sized hiatal hernia was present.      The exam was otherwise without abnormality.      The duodenal bulb and second portion of the duodenum were normal. The       scope was withdrawn. Dilation was performed with a Maloney dilator with       mild resistance at 46 Fr. The dilation site was examined following       endoscope reinsertion and showed moderate mucosal disruption. Estimated       blood loss was minimal. Impression:               - Esophagitis with no bleeding (mild Mallory-Weiss                            versus pill induced); Schatzki's ring; status post  Maloney dilation.                           - Medium-sized hiatal hernia.                           - The examination was otherwise normal.                           - Normal duodenal bulb and second portion of the                            duodenum.                           - No specimens collected. I doubt this lady has had                            a significant GI bleed. Moderate Sedation:      Moderate (conscious) sedation was personally administered by an       anesthesia professional. The following parameters were monitored: oxygen       saturation, heart rate, blood pressure,  respiratory rate, EKG, adequacy       of pulmonary ventilation, and response to care. Recommendation:           - Patient has a contact number available for                            emergencies. The signs and symptoms of potential                            delayed complications were discussed with the                            patient. Return to normal activities tomorrow.                            Written discharge instructions were provided to the                            patient.                           Continue present medications. Once daily PPI                            therapy chronically                           - Return patient to hospital ward for observation.                           - Mechanical soft diet. I called patient's husband                            Breklyn Fabrizio at [phone]. Reviewed findings  and recommendations. Procedure Code(s):        --- Professional ---                           [phone], Esophagogastroduodenoscopy, flexible,                            transoral; diagnostic, including collection of                            specimen(s) by brushing or washing, when performed                            (separate procedure)                           43450, Dilation of esophagus, by unguided sound or                            bougie, single or multiple passes Diagnosis Code(s):        --- Professional ---                           K20.90, Esophagitis, unspecified without bleeding                           K44.9, Diaphragmatic hernia without obstruction or                            gangrene                           R13.10, Dysphagia, unspecified                           K92.0, Hematemesis CPT copyright 2019 American Medical Association. All rights reserved. The codes documented in this report are preliminary and upon coder review may  be revised to meet current compliance requirements. Cristopher Estimable. Jaran Sainz, MD Norvel Richards, MD 10/16/2019 4:17:05 PM This report has been signed electronically. Number of Addenda: 0

## 2019-10-16 NOTE — Transfer of Care (Signed)
Immediate Anesthesia Transfer of Care Note  Patient: Tonya Cortez  Procedure(s) Performed: ESOPHAGOGASTRODUODENOSCOPY (EGD) WITH PROPOFOL (N/A ) ESOPHAGEAL DILATION  Patient Location: PACU  Anesthesia Type:MAC  Level of Consciousness: drowsy  Airway & Oxygen Therapy: Patient Spontanous Breathing  Post-op Assessment: Report given to RN and Post -op Vital signs reviewed and stable  Post vital signs: Reviewed and stable  Last Vitals:  Vitals Value Taken Time  BP 92/55 10/16/19 1615  Temp    Pulse 93 10/16/19 1617  Resp 18 10/16/19 1617  SpO2 93 % 10/16/19 1617  Vitals shown include unvalidated device data.  Last Pain:  Vitals:   10/16/19 1503  TempSrc: Oral      Patients Stated Pain Goal: Other (Comment) (pt unable to state pain goal) (14/38/88 7579)  Complications: No complications documented.

## 2019-10-16 NOTE — Care Management Obs Status (Signed)
[place] NOTIFICATION   Patient Details  Name: Tonya Cortez MRN: [id number] Date of Birth: 1941/02/10   Medicare Observation Status Notification Given:  Yes    Tommy Medal 10/16/2019, 4:17 PM

## 2019-10-16 NOTE — ED Notes (Signed)
Patient's daughter stated that patient fell yesterday at home but did not appear to be hurt from the fall. Daughter forgot to mention this at initial triage.

## 2019-10-16 NOTE — ED Notes (Signed)
Hand mittens placed on patient's bilateral hands due to patient trying to pull out her IV.

## 2019-10-16 NOTE — Consult Note (Signed)
Referring Provider: Dr. Wynetta Emery  Primary Care Physician:  Lucianne Lei, MD Primary Gastroenterologist:  Dr. Gala Romney   Date of Admission: 10/16/19 Date of Consultation: 10/16/19  Reason for Consultation:  Hematemesis   HPI:  Tonya Cortez is a 79 y.o. year old female with history of dementia, presenting to the ED yesterday evening by EMS due to acute onset of emesis. Concerns for melena prior to admission. Hgb 15.6 early this morning on admission, BUN normal at 12, creatinine 0.79. Heme negative in ED. Most recent Hgb 14. Mariann Laster , daughter, is at bedside and provides entire history due to patient's cognitive status. Mariann Laster also has been helping full-time as caretaker at home.   Reported fall Saturday evening by husband. She has had several falls prior to this. CT head today without acute findings. Since that time has gone downhill. Past 3 days has had decreased activity, not getting up for the bathroom like she normally would and instead going wherever she was sitting. Daughter saw stool black on Thursday and showed me a picture. Appears very dark in picture and unable to tell if truly black or not. Formed, bristol stool scale # 3.  No other dark stool noted. Denies oral iron, pepto, etc. No prior episodes of possible melena. No NSAIDs/aspirin products. No anticoagulation. No chronic PPI therapy.   Acute onset of vomiting around 2 am this morning, X3 episodes. [place]/clear per daughter. No further vomiting since that time. Decreased appetite for past 24-48 hours. No obvious abdominal pain. Would not eat yesterday. No typical GERD symptoms. "Strangled" on food Thursday. Intermittent dysphagia with solid and liquids for the past few weeks.   Last oral intake around 4-5pm yesterday, small amount of pinto beans. No significant weight loss noted.   No know previous EGD. Colonoscopy on file with adenomas from 2015.   Past Medical History:  Diagnosis Date  . Arthritis   . Bell's palsy   .  Borderline diabetes   . Cancer ([place])    breast  . Dementia ([place])   . Diverticulosis   . Hypercholesterolemia   . Memory difficulties   . Unsteady gait   . Vertigo     Past Surgical History:  Procedure Laterality Date  . ABDOMINAL HYSTERECTOMY    . BREAST SURGERY    . COLONOSCOPY  10/29/2008   EXB:MWUXLK examination with some left-sided diverticula/Diminutive cecal polyp, status post cold biopsy removal.  Right colonic mucosa appeared normal. adenomatous  . COLONOSCOPY N/A 01/17/2014   normal rectum, scattered left-sided diverticula, 2 diminutive polyps at cecum, tubular adenomas. Surveillance if health permits.   Marland Kitchen MASTECTOMY Left    abnormal mass in breast, non-cancerous per patient.   Marland Kitchen MASTECTOMY      Prior to Admission medications   Medication Sig Start Date End Date Taking? Authorizing Provider  citalopram (CELEXA) 10 MG/5ML suspension Take 10 mg by mouth daily.    [provider]  gabapentin (NEURONTIN) 250 MG/5ML solution Take 250 mg by mouth 3 (three) times daily as needed.    [provider]  Melatonin 1 MG/4ML LIQD Take 15 mLs by mouth at bedtime as needed.    [provider]    Current Facility-Administered Medications  Medication Dose Route Frequency Provider Last Rate Last Admin  . 0.9 %  sodium chloride infusion   Intravenous Continuous Reubin Milan, MD 100 mL/hr at 10/16/19 0543 New Bag at 10/16/19 0543  . acetaminophen (TYLENOL) tablet 650 mg  650 mg Oral Q6H PRN Reubin Milan,  MD       Or  . acetaminophen (TYLENOL) suppository 650 mg  650 mg Rectal Q6H PRN Reubin Milan, MD      . citalopram (CELEXA) 10 MG/5ML suspension 10 mg  10 mg Oral Daily Johnson, Clanford L, MD      . LORazepam (ATIVAN) injection 0.5 mg  0.5 mg Intravenous Q4H PRN Reubin Milan, MD      . melatonin tablet 3 mg  3 mg Oral QHS PRN Reubin Milan, MD      . ondansetron [facility]) tablet 4 mg  4 mg Oral Q6H PRN Reubin Milan, MD        Or  . ondansetron [facility]) injection 4 mg  4 mg Intravenous Q6H PRN Reubin Milan, MD      . pantoprazole (PROTONIX) 80 mg in sodium chloride 0.9 % 100 mL (0.8 mg/mL) infusion  8 mg/hr Intravenous Continuous Reubin Milan, MD 10 mL/hr at 10/16/19 0407 8 mg/hr at 10/16/19 0407  . [START ON 10/19/2019] pantoprazole (PROTONIX) injection 40 mg  40 mg Intravenous Q12H Reubin Milan, MD        Allergies as of 10/16/2019  . (No Known Allergies)    Family History  Problem Relation Age of Onset  . Colon cancer Neg Hx     Social History   Socioeconomic History  . Marital status: Married    Spouse name: Not on file  . Number of children: Not on file  . Years of education: Not on file  . Highest education level: Not on file  Occupational History  . Not on file  Tobacco Use  . Smoking status: Never Smoker  . Smokeless tobacco: Never Used  Substance and Sexual Activity  . Alcohol use: No  . Drug use: No  . Sexual activity: Not on file  Other Topics Concern  . Not on file  Social History Narrative  . Not on file   Social Determinants of Health   Financial Resource Strain:   . Difficulty of Paying Living Expenses:   Food Insecurity:   . Worried About Charity fundraiser in the Last Year:   . Arboriculturist in the Last Year:   Transportation Needs:   . Film/video editor (Medical):   Marland Kitchen Lack of Transportation (Non-Medical):   Physical Activity:   . Days of Exercise per Week:   . Minutes of Exercise per Session:   Stress:   . Feeling of Stress :   Social Connections:   . Frequency of Communication with Friends and Family:   . Frequency of Social Gatherings with Friends and Family:   . Attends Religious Services:   . Active Member of Clubs or Organizations:   . Attends Archivist Meetings:   Marland Kitchen Marital Status:   Intimate Partner Violence:   . Fear of Current or Ex-Partner:   . Emotionally Abused:   Marland Kitchen Physically Abused:   . Sexually  Abused:     Review of Systems: Unable to obtain due to cognitive status   Physical Exam: Vital signs in last 24 hours: Temp:  [98.6 F (37 C)-99 F (37.2 C)] 99 F (37.2 C) (07/12 0706) Pulse Rate:  [74-101] 81 (07/12 0706) Resp:  [18-20] 20 (07/12 0706) BP: (97-143)/(61-87) 110/61 (07/12 0706) SpO2:  [93 %-98 %] 96 % (07/12 0530) Weight:  [67.1 kg-72.6 kg] 67.1 kg (07/12 0706)   General:   Resting with eyes closed, no distress, mitts on  hands, sleeping. Head:  Normocephalic and atraumatic. Eyes:  Sclera clear, no icterus.   Mouth:  No deformity or lesions, dentition normal. Lungs:  Clear throughout to auscultation.   Heart:  S1 S2 present, regular, no obvious murmurs Abdomen:  Soft, mild grimacing with palpation epigastric, nondistended. No HSM. Rectal:  Deferred  Msk:  Symmetrical without gross deformities. Normal posture. Extremities:  Mild pedal edema Neurologic:  Unable to assess due to dementia Psych:  Flat affect  Intake/Output from previous day: 07/11 0701 - 07/12 0700 In: 600 [I.V.:100; IV Piggyback:500] Out: -  Intake/Output this shift: No intake/output data recorded.  Lab Results: Recent Labs    10/16/19 0340 10/16/19 0954  WBC 8.1  --   HGB 15.6* 14.0  HCT 48.0*  --   PLT 199  --    BMET Recent Labs    10/16/19 0340  NA 139  K 3.6  CL 102  CO2 27  GLUCOSE 115*  BUN 12  CREATININE 0.79  CALCIUM 8.9   LFT Recent Labs    10/16/19 0340  PROT 7.8  ALBUMIN 4.0  AST 22  ALT 16  ALKPHOS 53  BILITOT 0.9   PT/INR Recent Labs    10/16/19 0340  LABPROT 12.5  INR 1.0    Studies/Results: CT HEAD WO CONTRAST  Result Date: 10/16/2019 CLINICAL DATA:  [place] at home. Altered mental status. EXAM: CT HEAD WITHOUT CONTRAST TECHNIQUE: Contiguous axial images were obtained from the base of the skull through the vertex without intravenous contrast. COMPARISON:  08/05/2019 FINDINGS: Brain: Stable age related cerebral atrophy, ventriculomegaly and  periventricular white matter disease. No extra-axial fluid collections are identified. No CT findings for acute hemispheric infarction or intracranial hemorrhage. No mass lesions. The brainstem and cerebellum are normal. Vascular: Vascular calcifications but no aneurysm or hyperdense vessels. Skull: No skull fracture or bone lesions. Sinuses/Orbits: The paranasal sinuses and mastoid air cells are clear. The globes are intact. Other: No scalp lesions or hematoma. IMPRESSION: 1. Stable age related cerebral atrophy, ventriculomegaly and periventricular white matter disease. 2. No acute intracranial findings or skull fracture. Electronically Signed   By: Marijo Sanes M.D.   On: 10/16/2019 07:13    Impression: 79 year old female with advanced dementia, requiring dedicated care at home by daughter, presenting with 3 episodes of acute onset of hematemesis prior to admission and possible melena Thursday, although she is heme negative this admission. Hgb on admission overnight was near her baseline (15.6), and now 14 this morning. No obvious bump in BUN. Denying NSAIDs, aspirin powders, no anticoagulation, and no prior ingestion of pepto, iron, etc.  With advanced dementia, entire history was taken from Hazle Coca, patient's daughter and caretaker. Does not appear to have significant UGI bleed and more likely dealing with esophagitis, gastritis, possible MW tear, less likely malignancy. Notably, she has also reported solid and liquid dysphagia for the past few weeks intermittently. No prior EGD.  Discussed diagnostic EGD with possible dilation if needed at time of EGD today. She has been NPO since midnight. Continues with Protonix infusion. No further evidence for any overt GI bleeding. Stable for EGD with Propofol today. I discussed risks and benefits with daughter at bedside, who desires further endoscopic evaluation.   COVID negative this admission.   Plan: Remain NPO Continue PPI infusion for  now EGD/dilation with Propofol by Dr. Gala Romney. Risks, benefits, and alternatives discussed with daughter at bedside.  May benefit from palliative care consult as she has had marked decline in functional status,  advanced dementia.   Annitta Needs, PhD, ANP-BC [facility] Gastroenterology     LOS: 0 days    10/16/2019, 10:51 AM

## 2019-10-17 DIAGNOSIS — E7800 Pure hypercholesterolemia, unspecified: Secondary | ICD-10-CM | POA: Diagnosis not present

## 2019-10-17 DIAGNOSIS — K920 Hematemesis: Secondary | ICD-10-CM

## 2019-10-17 DIAGNOSIS — F0391 Unspecified dementia with behavioral disturbance: Secondary | ICD-10-CM | POA: Diagnosis not present

## 2019-10-17 DIAGNOSIS — R1310 Dysphagia, unspecified: Secondary | ICD-10-CM | POA: Diagnosis not present

## 2019-10-17 DIAGNOSIS — D751 Secondary polycythemia: Secondary | ICD-10-CM

## 2019-10-17 DIAGNOSIS — R112 Nausea with vomiting, unspecified: Secondary | ICD-10-CM | POA: Diagnosis not present

## 2019-10-17 LAB — COMPREHENSIVE METABOLIC PANEL
ALT: 14 U/L (ref 0–44)
AST: 21 U/L (ref 15–41)
Albumin: 3.4 g/dL — ABNORMAL LOW (ref 3.5–5.0)
Alkaline Phosphatase: 44 U/L (ref 38–126)
Anion gap: 7 (ref 5–15)
BUN: 8 mg/dL (ref 8–23)
CO2: 26 mmol/L (ref 22–32)
Calcium: 8.6 mg/dL — ABNORMAL LOW (ref 8.9–10.3)
Chloride: 106 mmol/L (ref 98–111)
Creatinine, Ser: 0.75 mg/dL (ref 0.44–1.00)
GFR calc Af Amer: >60 mL/min (ref >60)
GFR calc non Af Amer: >60 mL/min (ref >60)
Glucose, Bld: 98 mg/dL (ref 70–99)
Potassium: 3.5 mmol/L (ref 3.5–5.1)
Sodium: 139 mmol/L (ref 135–145)
Total Bilirubin: 1.1 mg/dL (ref 0.3–1.2)
Total Protein: 6.4 g/dL — ABNORMAL LOW (ref 6.5–8.1)

## 2019-10-17 LAB — MAGNESIUM: Magnesium: 2 mg/dL (ref 1.7–2.4)

## 2019-10-17 LAB — CBC
HCT: 43.2 % (ref 36.0–46.0)
Hemoglobin: 14 g/dL (ref 12.0–15.0)
MCH: 29.3 pg (ref 26.0–34.0)
MCHC: 32.4 g/dL (ref 30.0–36.0)
MCV: 90.4 fL (ref 80.0–100.0)
Platelets: 184 10*3/uL (ref 150–400)
RBC: 4.78 MIL/uL (ref 3.87–5.11)
RDW: 13.6 % (ref 11.5–15.5)
WBC: 6.2 10*3/uL (ref 4.0–10.5)
nRBC: 0 % (ref 0.0–0.2)

## 2019-10-17 MED ORDER — OMEPRAZOLE 2 MG/ML ORAL SUSPENSION: 20.0000 mg | Freq: Every day | ORAL | 1 refills | Status: DC

## 2019-10-17 NOTE — TOC Transition Note (Signed)
Transition of Care [facility]) - CM/SW Discharge Note   Patient Details  Name: Tonya Cortez MRN: [id number] Date of Birth: 1940-11-16  Transition of Care [facility]) CM/SW Contact:  Salome Arnt, LCSW Phone Number: 10/17/2019, 1:18 PM   Clinical Narrative:  Pt d/c today. LCSW faxed [place] DME orders to try to keep pt's current equipment in the home after revoking hospice. LCSW discussed home health with pt's daughter who was agreeable to referral to [facility] for PT, RN, and aide. LCSW notified pt's daughter that [place] was not in network with [employer]. Daughter states that they have decided to continue hospice services after d/c. Daughter is aware that pt is unable to receive home health services through [facility] in addition to hospice. LCSW notified MD, [employer], hospice, and [facility] that pt is returning home with hospice services. No other needs reported at this time.     Final next level of care: Home w Hospice Care Barriers to Discharge: Barriers Resolved   Patient Goals and CMS Choice Patient states their goals for this hospitalization and ongoing recovery are:: return home      Discharge Placement                  Name of family member notified: Mariann Laster- daughter Patient and family notified of of transfer: 10/17/19  Discharge Plan and Services In-house Referral: Clinical Social Work   Post Acute Care Choice: Home Health          DME Arranged: Hospital bed, Overbed table, 3-N-1 DME Agency: [facility] Date DME Agency Contacted: 10/16/19 Time DME Agency Contacted: [phone]              Social Determinants of Health (SDOH) Interventions     Readmission Risk Interventions No flowsheet data found.

## 2019-10-17 NOTE — Discharge Instructions (Signed)
  IMPORTANT INFORMATION: PAY CLOSE ATTENTION   PHYSICIAN DISCHARGE INSTRUCTIONS  Follow with Primary care provider  Lucianne Lei, MD  and other consultants as instructed by your Hospitalist Physician  [place] IF SYMPTOMS COME BACK, WORSEN OR NEW PROBLEM DEVELOPS   Please note: You were cared for by a hospitalist during your hospital stay. Every effort will be made to forward records to your primary care provider.  You can request that your primary care provider send for your hospital records if they have not received them.  Once you are discharged, your primary care physician will handle any further medical issues. Please note that NO REFILLS for any discharge medications will be authorized once you are discharged, as it is imperative that you return to your primary care physician (or establish a relationship with a primary care physician if you do not have one) for your post hospital discharge needs so that they can reassess your need for medications and monitor your lab values.  Please get a complete blood count and chemistry panel checked by your Primary MD at your next visit, and again as instructed by your Primary MD.  Get Medicines reviewed and adjusted: Please take all your medications with you for your next visit with your Primary MD  Laboratory/radiological data: Please request your Primary MD to go over all hospital tests and procedure/radiological results at the follow up, please ask your primary care provider to get all Hospital records sent to his/her office.  In some cases, they will be blood work, cultures and biopsy results pending at the time of your discharge. Please request that your primary care provider follow up on these results.  If you are diabetic, please bring your blood sugar readings with you to your follow up appointment with primary care.    Please call and make your follow up appointments as soon as possible.    Also Note  the following: If you experience worsening of your admission symptoms, develop shortness of breath, life threatening emergency, suicidal or homicidal thoughts you must seek medical attention immediately by calling 911 or calling your MD immediately  if symptoms less severe.  You must read complete instructions/literature along with all the possible adverse reactions/side effects for all the Medicines you take and that have been prescribed to you. Take any new Medicines after you have completely understood and accpet all the possible adverse reactions/side effects.   Do not drive when taking Pain medications or sleeping medications (Benzodiazepines)  Do not take more than prescribed Pain, Sleep and Anxiety Medications. It is not advisable to combine anxiety,sleep and pain medications without talking with your primary care practitioner  Special Instructions: If you have smoked or chewed Tobacco  in the last 2 yrs please stop smoking, stop any regular Alcohol  and or any Recreational drug use.  Wear Seat belts while driving.  Do not drive if taking any narcotic, mind altering or controlled substances or recreational drugs or alcohol.

## 2019-10-17 NOTE — Evaluation (Signed)
Physical Therapy Evaluation Patient Details Name: Tonya Cortez MRN: [id number] DOB: September 28, 1940 Today's Date: 10/17/2019   History of Present Illness  Tonya Cortez is a 79 y.o. female with medical history significant of osteoarthritis, Bell's palsy, glucose intolerance, breast cancer, diverticulosis, hyperlipidemia, progressively worse memory difficulties/dementia since diagnosed about 6 years ago, unsteady gait, history of vertigo who is brought to the emergency department due to having 3 episodes of emesis, which per patient's daughter looked bloody about an hour before coming to the hospital.  She also had a picture of the patient's last bowel movement on July 10, which looked melanotic.  There is no history of PUD or gastritis.  Her daughter states that she is not taking NSAIDs.  The patient is unable to provide further history, but per the patient's daughter there has not been fever, dyspnea, complains of chest, abdominal or back pain.  She also mentions that the patient's mental status, appetite and level of activity has decreased from baseline in the past 2 days.    Clinical Impression  Patient presents with her daughter present in room.  Patient requires constant verbal/tactile cueing to participate, requires hand held assist for transfers and gait training due to poor carryover attempting to use RW, able to ambulate in hallway with occasional staggering left/right without losing balance, limited for ambulation due to c/o fatigue and tolerated sitting up in chair to eat breakfast with her daughter assisting.  Patient's daughter state she feel comfortable taking care of patient at home.  Plan:  Patient to be discharged home today and discharged from physical therapy to care of nursing for ambulation as tolerated for length of stay.    Follow Up Recommendations Home health PT;Supervision for mobility/OOB;Supervision/Assistance - 24 hour    Equipment Recommendations  None recommended  by PT    Recommendations for Other Services       Precautions / Restrictions Precautions Precautions: Fall Restrictions Weight Bearing Restrictions: No      Mobility  Bed Mobility Overal bed mobility: Needs Assistance Bed Mobility: Supine to Sit     Supine to sit: Min assist     General bed mobility comments: increased time, labored movement  Transfers Overall transfer level: Needs assistance Equipment used: 1 person hand held assist Transfers: Sit to/from Stand;Stand Pivot Transfers Sit to Stand: Min assist Stand pivot transfers: Min assist       General transfer comment: labored unsteady movement requiring hand held assist to maintain standing balance  Ambulation/Gait Ambulation/Gait assistance: Min assist Gait Distance (Feet): 40 Feet Assistive device: 1 person hand held assist Gait Pattern/deviations: Decreased step length - right;Decreased step length - left;Decreased stride length;Staggering left;Staggering right Gait velocity: decreased   General Gait Details: slow unsteady labored cadence with occasional staggering left/right requiring hand held assist to avoid loss of balance, poor carryover for attempting to use RW, limited secondary to fatigue  Stairs            Wheelchair Mobility    Modified Rankin (Stroke Patients Only)       Balance Overall balance assessment: Needs assistance Sitting-balance support: Feet supported;No upper extremity supported Sitting balance-Leahy Scale: Fair Sitting balance - Comments: fair/good seated at EOB   Standing balance support: During functional activity;Single extremity supported Standing balance-Leahy Scale: Poor Standing balance comment: fair/poor with hand held assist                             Pertinent Vitals/Pain Pain  Assessment: No/denies pain    Home Living Family/patient expects to be discharged to:: Private residence Living Arrangements: Children;Spouse/significant  other Available Help at Discharge: Family;Available 24 hours/day Type of Home: House Home Access: Stairs to enter Entrance Stairs-Rails: None Entrance Stairs-Number of Steps: 2 Home Layout: Two level;Able to live on main level with bedroom/bathroom;Laundry or work area in [employer]: None Additional Comments: Per patient's daughter, patient not cognitively able to use AD due to dementia    Prior Function Level of Independence: Needs assistance   Gait / Transfers Assistance Needed: household ambulator with supervision  ADL's / Homemaking Assistance Needed: assisted by family        Hand Dominance        Extremity/Trunk Assessment   Upper Extremity Assessment Upper Extremity Assessment: Overall WFL for tasks assessed    Lower Extremity Assessment Lower Extremity Assessment: Generalized weakness    Cervical / Trunk Assessment Cervical / Trunk Assessment: Kyphotic  Communication   Communication: Other (comment) (requires frequent verbal/tactile cueing and redirection to participate with functional activities)  Cognition Arousal/Alertness: Awake/alert Behavior During Therapy: Restless;Impulsive Overall Cognitive Status: History of cognitive impairments - at baseline                                        General Comments      Exercises     Assessment/Plan    PT Assessment All further PT needs can be met in the next venue of care  PT Problem List Decreased strength;Decreased activity tolerance;Decreased balance;Decreased mobility       PT Treatment Interventions      PT Goals (Current goals can be found in the Care Plan section)  Acute Rehab PT Goals Patient Stated Goal: return home with family to assist PT Goal Formulation: With patient/family Time For Goal Achievement: 10/17/19 Potential to Achieve Goals: Good    Frequency     Barriers to discharge        Co-evaluation               AM-PAC PT "6 Clicks" Mobility   Outcome Measure Help needed turning from your back to your side while in a flat bed without using bedrails?: None Help needed moving from lying on your back to sitting on the side of a flat bed without using bedrails?: A Little Help needed moving to and from a bed to a chair (including a wheelchair)?: A Little Help needed standing up from a chair using your arms (e.g., wheelchair or bedside chair)?: A Little Help needed to walk in hospital room?: A Lot Help needed climbing 3-5 steps with a railing? : A Lot 6 Click Score: 17    End of Session   Activity Tolerance: Patient tolerated treatment well;Patient limited by fatigue Patient left: in chair;with call bell/phone within reach;with family/visitor present Nurse Communication: Mobility status PT Visit Diagnosis: Unsteadiness on feet (R26.81);Other abnormalities of gait and mobility (R26.89);Muscle weakness (generalized) (M62.81)    Time: 7106-2694 PT Time Calculation (min) (ACUTE ONLY): 28 min   Charges:   PT Evaluation $PT Eval Moderate Complexity: 1 Mod PT Treatments $Therapeutic Activity: 23-37 mins        12:37 PM, 10/17/19 Lonell Grandchild, MPT Physical Therapist with [facility] 336 [phone] office [phone] mobile phone

## 2019-10-17 NOTE — Plan of Care (Signed)

## 2019-10-17 NOTE — Discharge Summary (Addendum)
Physician Discharge Summary  KOBIE MATKINS KGU:[id number] DOB: 1941/01/14 DOA: 10/16/2019  PCP: Lucianne Lei, MD  Admit date: 10/16/2019 Discharge date: 10/17/2019  Admitted From:  Home with hospice  Disposition: Home with hospice services  Recommendations for Outpatient Follow-up:  1. Follow up with PCP in 1-2 weeks  Home Health:  Home Hospice  Discharge Condition: STABLE   CODE STATUS: DNR    Brief Hospitalization Summary: Please see all hospital notes, images, labs for full details of the hospitalization. ADMISSION HPI: Tonya Cortez is a 79 y.o. female with medical history significant of osteoarthritis, Bell's palsy, glucose intolerance, breast cancer, diverticulosis, hyperlipidemia, progressively worse memory difficulties/dementia since diagnosed about 6 years ago, unsteady gait, history of vertigo who is brought to the emergency department due to having 3 episodes of emesis, which per patient's daughter looked bloody about an hour before coming to the hospital.  She also had a picture of the patient's last bowel movement on July 10, which looked melanotic.  There is no history of PUD or gastritis.  Her daughter states that she is not taking NSAIDs.  The patient is unable to provide further history, but per the patient's daughter there has not been fever, dyspnea, complains of chest, abdominal or back pain.  She also mentions that the patient's mental status, appetite and level of activity has decreased from baseline in the past 2 days.  ED Course: Initial vital signs were temperature 98.6 F, pulse 101, respiration 18, blood pressure 120/87 mmHg and O2 sat 95% on room air.  The patient was given a 500 mL NS bolus and then pantoprazole 80 mg IVP followed by an 8 mg/h infusion.  CBC shows a white count of 8.1, hemoglobin 15.6 g/dL and platelets 199.  PT, INR and PTT within normal limits.  Her CMP shows a glucose of 115 mg/dL, but all other values are within expected range.  Her  BUN level was only 12 mg/dL.  Fecal occult blood was negative.  The patient was admitted for observation for coffee ground emesis and concern for GI bleeding.  She has baseline dementia but had been on home hospice.  Family decided to rescind home hospice but then decided to go back on hospice services right before discharge.  Home health was arranged but then discontinued. Pt was seen by GI and had EGD with findings of esophagitis.  Dr. Dudley Major recommended that patient stay on PPI therapy indefinitely.  He did not find any source of significant GI bleed.  She remained stable.  She is safe to discharge home with hospice services.     Discharge Diagnoses:  Principal Problem:   Emesis Active Problems:   Dementia ([place])   Polycythemia   Hypercholesterolemia   Esophageal dysphagia  Discharge Instructions:  Allergies as of 10/17/2019   No Known Allergies     Medication List    STOP taking these medications   gabapentin 250 MG/5ML solution Commonly known as: NEURONTIN     TAKE these medications   citalopram 10 MG/5ML suspension Commonly known as: CELEXA Take 10 mg by mouth daily.   Melatonin 1 MG/4ML Liqd Take 15 mLs by mouth at bedtime as needed.   omeprazole 2 mg/mL Susp oral suspension Commonly known as: FIRST-Omeprazole Take 10 mLs (20 mg total) by mouth daily.            Durable Medical Equipment  (From admission, onward)         Start     Ordered   10/16/19  1356  For home use only DME Overbed table  Once        10/16/19 1355   10/16/19 1356  For home use only DME Hospital bed  Once       Question Answer Comment  Length of Need Lifetime   Bed type Semi-electric      10/16/19 1355   10/16/19 1356  For home use only DME 3 n 1  Once        10/16/19 1355          Follow-up Information    Lucianne Lei, MD. Schedule an appointment as soon as possible for a visit in 2 week(s).   Specialty: Family Medicine Contact information: [address] [place]  [address] [phone]              No Known Allergies Allergies as of 10/17/2019   No Known Allergies     Medication List    STOP taking these medications   gabapentin 250 MG/5ML solution Commonly known as: NEURONTIN     TAKE these medications   citalopram 10 MG/5ML suspension Commonly known as: CELEXA Take 10 mg by mouth daily.   Melatonin 1 MG/4ML Liqd Take 15 mLs by mouth at bedtime as needed.   omeprazole 2 mg/mL Susp oral suspension Commonly known as: FIRST-Omeprazole Take 10 mLs (20 mg total) by mouth daily.            Durable Medical Equipment  (From admission, onward)         Start     Ordered   10/16/19 1356  For home use only DME Overbed table  Once        10/16/19 1355   10/16/19 1356  For home use only DME Hospital bed  Once       Question Answer Comment  Length of Need Lifetime   Bed type Semi-electric      10/16/19 1355   10/16/19 1356  For home use only DME 3 n 1  Once        10/16/19 1355         Procedures/Studies: CT HEAD WO CONTRAST  Result Date: 10/16/2019 CLINICAL DATA:  [place] at home. Altered mental status. EXAM: CT HEAD WITHOUT CONTRAST TECHNIQUE: Contiguous axial images were obtained from the base of the skull through the vertex without intravenous contrast. COMPARISON:  08/05/2019 FINDINGS: Brain: Stable age related cerebral atrophy, ventriculomegaly and periventricular white matter disease. No extra-axial fluid collections are identified. No CT findings for acute hemispheric infarction or intracranial hemorrhage. No mass lesions. The brainstem and cerebellum are normal. Vascular: Vascular calcifications but no aneurysm or hyperdense vessels. Skull: No skull fracture or bone lesions. Sinuses/Orbits: The paranasal sinuses and mastoid air cells are clear. The globes are intact. Other: No scalp lesions or hematoma. IMPRESSION: 1. Stable age related cerebral atrophy, ventriculomegaly and periventricular white matter disease. 2. No acute  intracranial findings or skull fracture. Electronically Signed   By: Marijo Sanes M.D.   On: 10/16/2019 07:13      Subjective: Pt with no specific complaints, eating better today with assistance from family.   Discharge Exam: Vitals:   10/16/19 2129 10/17/19 0447  BP: 110/65 118/75  Pulse: 79 66  Resp:    Temp: 98.3 F (36.8 C) 98.2 F (36.8 C)  SpO2: 99% 100%   Vitals:   10/16/19 1628 10/16/19 1824 10/16/19 2129 10/17/19 0447  BP: 101/74 111/85 110/65 118/75  Pulse: 78 83 79 66  Resp: 19 18    Temp:  98.1 F (36.7 C) 98.3 F (36.8 C) 98.2 F (36.8 C)  TempSrc:  Axillary Oral Oral  SpO2: 94% 95% 99% 100%  Weight:      Height:       General: Pt is alert, awake, not in acute distress, with dementia. Cardiovascular: normal S1/S2 +, no rubs, no gallops Respiratory: CTA bilaterally, no wheezing, no rhonchi Abdominal: Soft, NT, ND, bowel sounds + Extremities: no edema, no cyanosis Neurological: nonfocal exam   The results of significant diagnostics from this hospitalization (including imaging, microbiology, ancillary and laboratory) are listed below for reference.     Microbiology: Recent Results (from the past 240 hour(s))  SARS Coronavirus 2 by RT PCR (hospital order, performed in [facility] hospital lab) Nasopharyngeal Nasopharyngeal Swab     Status: None   Collection Time: 10/16/19  5:05 AM   Specimen: Nasopharyngeal Swab  Result Value Ref Range Status   SARS Coronavirus 2 NEGATIVE NEGATIVE Final    Comment: (NOTE) SARS-CoV-2 target nucleic acids are NOT DETECTED.  The SARS-CoV-2 RNA is generally detectable in upper and lower respiratory specimens during the acute phase of infection. The lowest concentration of SARS-CoV-2 viral copies this assay can detect is 250 copies / mL. A negative result does not preclude SARS-CoV-2 infection and should not be used as the sole basis for treatment or other patient management decisions.  A negative result may occur  with improper specimen collection / handling, submission of specimen other than nasopharyngeal swab, presence of viral mutation(s) within the areas targeted by this assay, and inadequate number of viral copies (<250 copies / mL). A negative result must be combined with clinical observations, patient history, and epidemiological information.  Fact Sheet for Patients:   StrictlyIdeas.no  Fact Sheet for Healthcare Providers: BankingDealers.co.za  This test is not yet approved or  cleared by the [place] FDA and has been authorized for detection and/or diagnosis of SARS-CoV-2 by FDA under an Emergency Use Authorization (EUA).  This EUA will remain in effect (meaning this test can be used) for the duration of the COVID-19 declaration under Section 564(b)(1) of the Act, 21 U.S.C. section 360bbb-3(b)(1), unless the authorization is terminated or revoked sooner.  Performed at [facility], [address], [place], [place] [address]      Labs: BNP (last 3 results) No results for input(s): BNP in the last 8760 hours. Basic Metabolic Panel: Recent Labs  Lab 10/16/19 0340 10/17/19 0550  NA 139 139  K 3.6 3.5  CL 102 106  CO2 27 26  GLUCOSE 115* 98  BUN 12 8  CREATININE 0.79 0.75  CALCIUM 8.9 8.6*  MG  --  2.0   Liver Function Tests: Recent Labs  Lab 10/16/19 0340 10/17/19 0550  AST 22 21  ALT 16 14  ALKPHOS 53 44  BILITOT 0.9 1.1  PROT 7.8 6.4*  ALBUMIN 4.0 3.4*   No results for input(s): LIPASE, AMYLASE in the last 168 hours. No results for input(s): AMMONIA in the last 168 hours. CBC: Recent Labs  Lab 10/16/19 0340 10/16/19 0954 10/17/19 0550  WBC 8.1  --  6.2  NEUTROABS 6.3  --   --   HGB 15.6* 14.0 14.0  HCT 48.0*  --  43.2  MCV 90.2  --  90.4  PLT 199  --  184   Cardiac Enzymes: No results for input(s): CKTOTAL, CKMB, CKMBINDEX, TROPONINI in the last 168 hours. BNP: Invalid input(s): POCBNP CBG: No  results for input(s): GLUCAP in the last 168 hours. D-Dimer No results for input(s): DDIMER in the last 72 hours. Hgb A1c No results for input(s): HGBA1C in the last 72 hours. Lipid Profile No results for input(s): CHOL, HDL, LDLCALC, TRIG, CHOLHDL, LDLDIRECT in the last 72 hours. Thyroid function studies No results for input(s): TSH, T4TOTAL, T3FREE, THYROIDAB in the last 72 hours.  Invalid input(s): FREET3 Anemia work up No results for input(s): VITAMINB12, FOLATE, FERRITIN, TIBC, IRON, RETICCTPCT in the last 72 hours. Urinalysis    Component Value Date/Time   COLORURINE STRAW (A) 05/08/2018 1642   APPEARANCEUR CLEAR 05/08/2018 1642   LABSPEC 1.009 05/08/2018 1642   PHURINE 7.0 05/08/2018 1642   GLUCOSEU NEGATIVE 05/08/2018 1642   HGBUR NEGATIVE 05/08/2018 1642   BILIRUBINUR NEGATIVE 05/08/2018 1642   KETONESUR NEGATIVE 05/08/2018 1642   PROTEINUR NEGATIVE 05/08/2018 1642   UROBILINOGEN 0.2 11/10/2013 1651   NITRITE NEGATIVE 05/08/2018 1642   LEUKOCYTESUR NEGATIVE 05/08/2018 1642   Sepsis Labs Invalid input(s): PROCALCITONIN,  WBC,  LACTICIDVEN Microbiology Recent Results (from the past 240 hour(s))  SARS Coronavirus 2 by RT PCR (hospital order, performed in [facility] hospital lab) Nasopharyngeal Nasopharyngeal Swab     Status: None   Collection Time: 10/16/19  5:05 AM   Specimen: Nasopharyngeal Swab  Result Value Ref Range Status   SARS Coronavirus 2 NEGATIVE NEGATIVE Final    Comment: (NOTE) SARS-CoV-2 target nucleic acids are NOT DETECTED.  The SARS-CoV-2 RNA is generally detectable in upper and lower respiratory specimens during the acute phase of infection. The lowest concentration of SARS-CoV-2 viral copies this assay can detect is 250 copies / mL. A negative result does not preclude SARS-CoV-2 infection and should not be used as the sole basis for treatment or other patient management decisions.  A negative result may occur with improper specimen collection /  handling, submission of specimen other than nasopharyngeal swab, presence of viral mutation(s) within the areas targeted by this assay, and inadequate number of viral copies (<250 copies / mL). A negative result must be combined with clinical observations, patient history, and epidemiological information.  Fact Sheet for Patients:   StrictlyIdeas.no  Fact Sheet for Healthcare Providers: BankingDealers.co.za  This test is not yet approved or  cleared by the [place] FDA and has been authorized for detection and/or diagnosis of SARS-CoV-2 by FDA under an Emergency Use Authorization (EUA).  This EUA will remain in effect (meaning this test can be used) for the duration of the COVID-19 declaration under Section 564(b)(1) of the Act, 21 U.S.C. section 360bbb-3(b)(1), unless the authorization is terminated or revoked sooner.  Performed at [facility], [address], [place], [place] [address]    Time coordinating discharge:   SIGNED:  Irwin Brakeman, MD  [facility] 10/17/2019, 11:12 AM How to contact the [facility] Attending or Consulting provider [place] or covering provider during after hours [place], for this patient?  1. Check the care team in [facility] and look for a) attending/consulting [facility] provider listed and b) the [facility] team listed 2. Log into [email or web address] and use [place]'s universal password to access. If you do not have the password, please contact the hospital operator. 3. Locate the [facility] provider you are looking for under [facility] and page to a number that you can be directly reached. 4. If you still have difficulty reaching the provider, please page the [facility] (Director on Call) for the Hospitalists listed on amion for assistance.

## 2019-10-23 DIAGNOSIS — F0150 Vascular dementia without behavioral disturbance: Secondary | ICD-10-CM | POA: Diagnosis not present

## 2019-10-26 ENCOUNTER — Encounter (HOSPITAL_COMMUNITY): Payer: Self-pay | Admitting: Internal Medicine

## 2019-11-23 DIAGNOSIS — F0150 Vascular dementia without behavioral disturbance: Secondary | ICD-10-CM | POA: Diagnosis not present

## 2020-12-19 DIAGNOSIS — F0150 Vascular dementia without behavioral disturbance: Secondary | ICD-10-CM | POA: Diagnosis not present

## 2021-05-01 IMAGING — CT CT HEAD W/O CM
3 of 6 series · 15 of 47 positions shown, 18 images · non-contrast
Comparison: 08/05/2019

CLINICAL DATA: Fell at home. Altered mental status.

EXAM:
CT HEAD WITHOUT CONTRAST
TECHNIQUE: Contiguous axial images were obtained from the base of the skull
through the vertex without intravenous contrast.

[Series 2: head w o · axial · 0.45mm/px · z∈[-188,-53]mm · 10 of 33 slices shown, 13 images]
[im 3/33  brain]
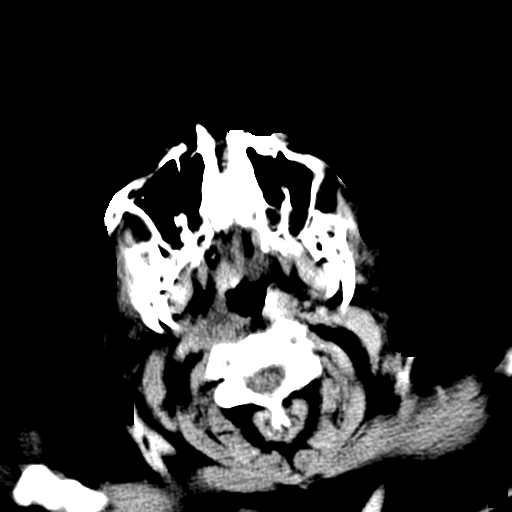
[im 3/33  bone]
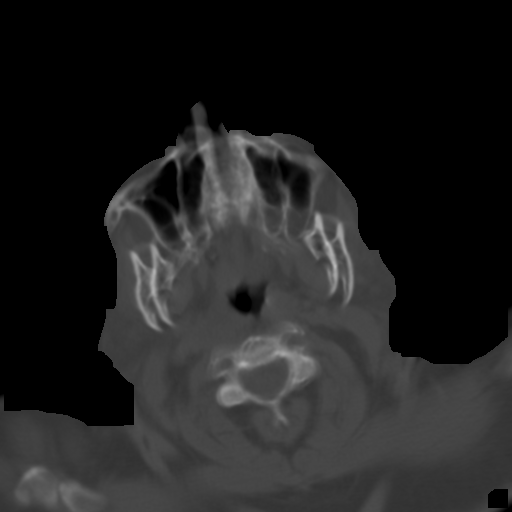
[im 5/33  brain]
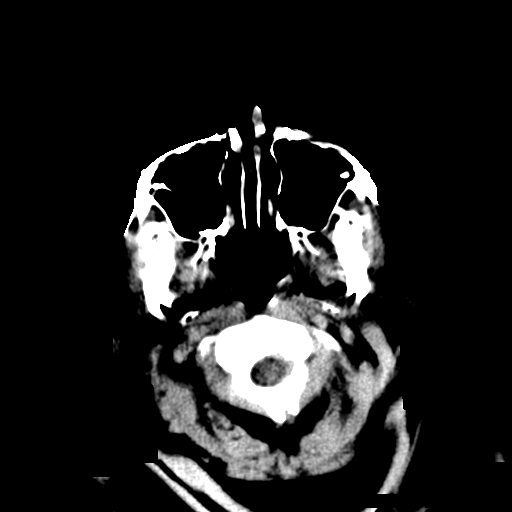
[im 10/33  brain]
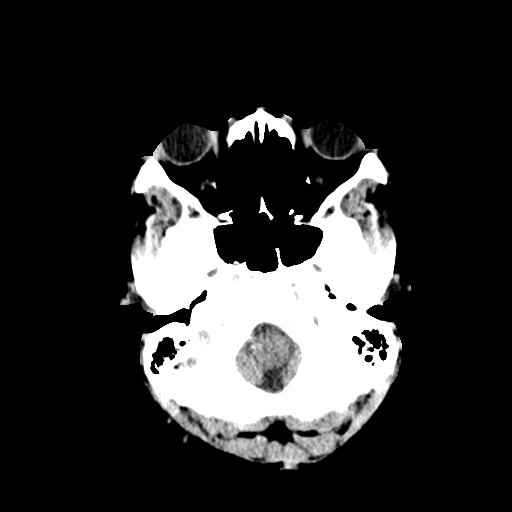
[im 12/33  brain]
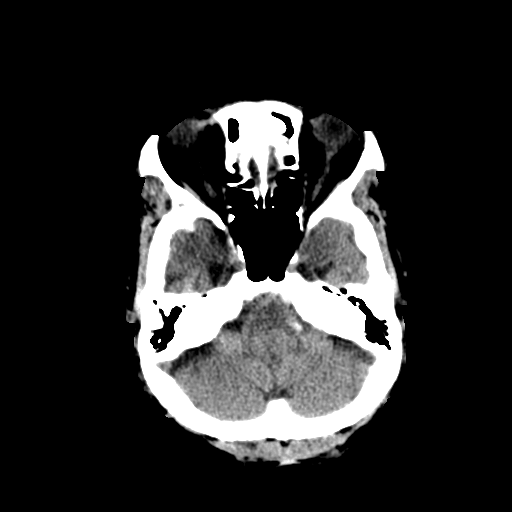
[im 14/33  brain]
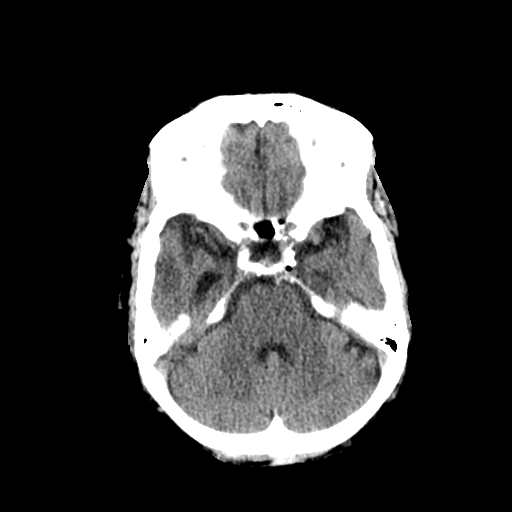
[im 14/33  bone]
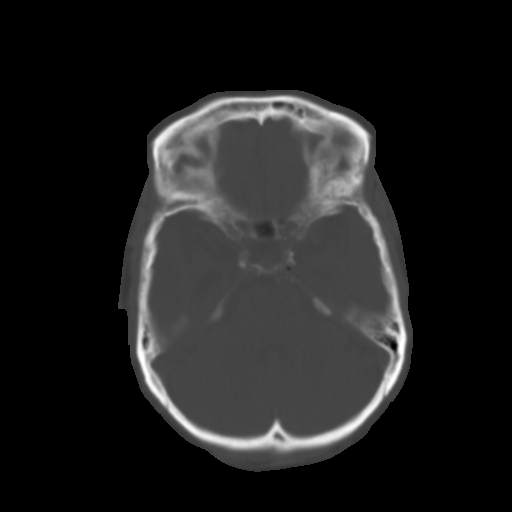
[im 19/33  brain]
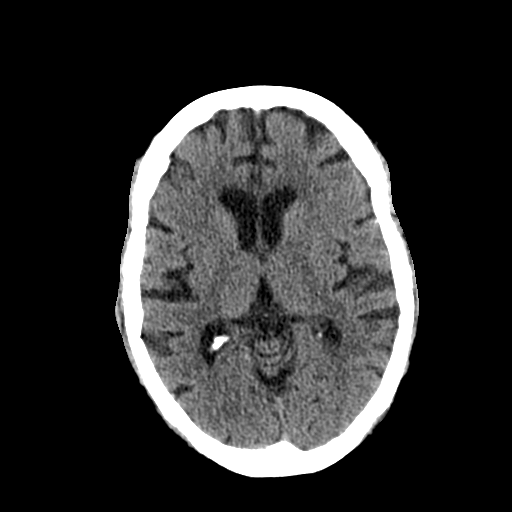
[im 21/33  brain]
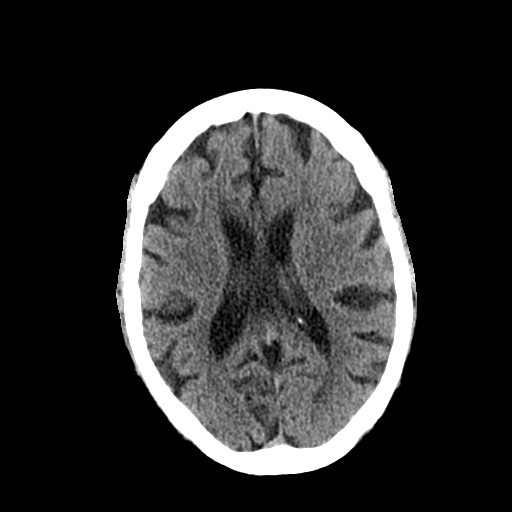
[im 23/33  brain]
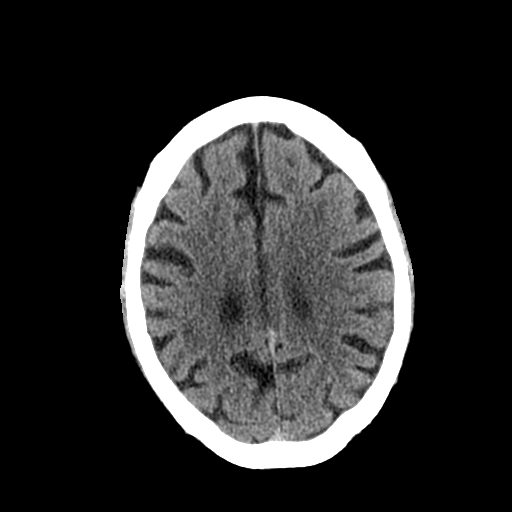
[im 28/33  brain]
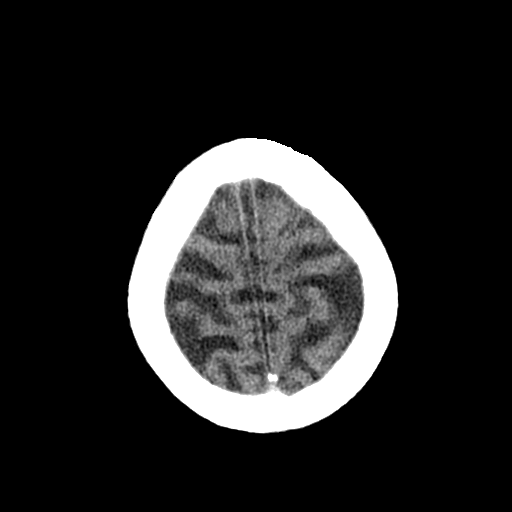
[im 28/33  bone]
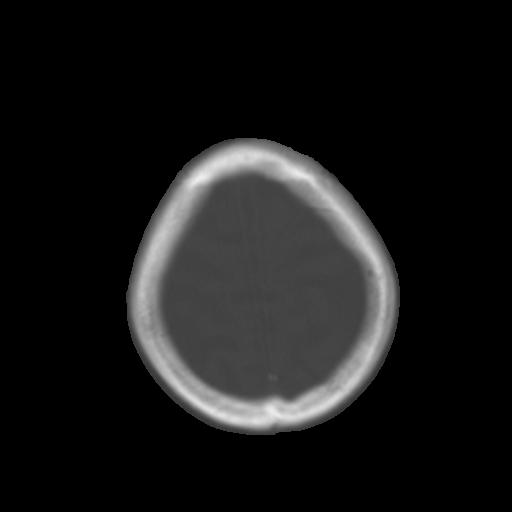
[im 30/33  brain]
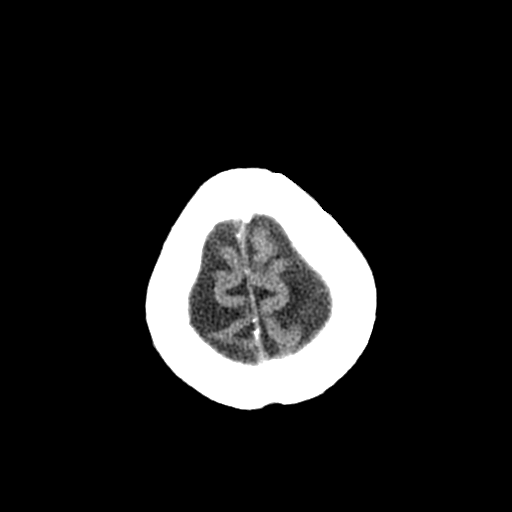

[Series 8: coronal soft · coronal · 0.35mm/px · 3 of 83 slices shown]
[im 21/83  brain]
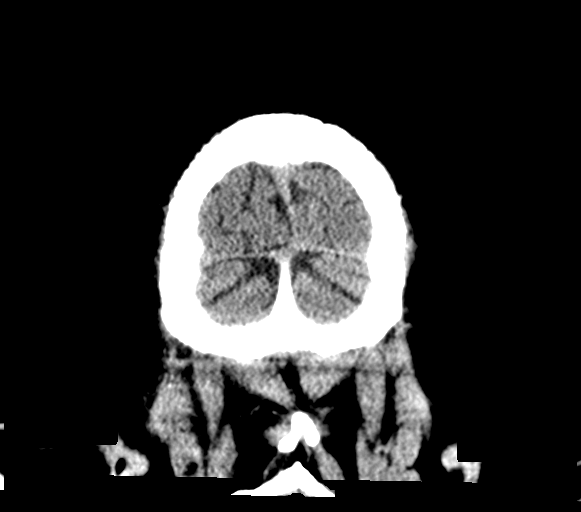
[im 42/83  brain]
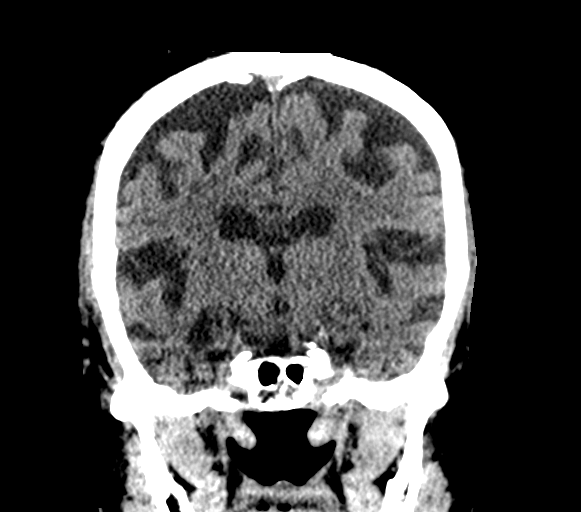
[im 62/83  brain]
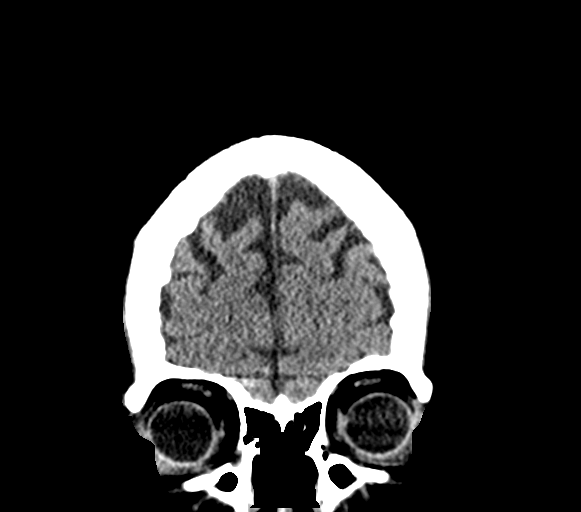

[Series 9: sagittal soft · sagittal · 0.33mm/px · 2 of 62 slices shown]
[im 21/62  brain]
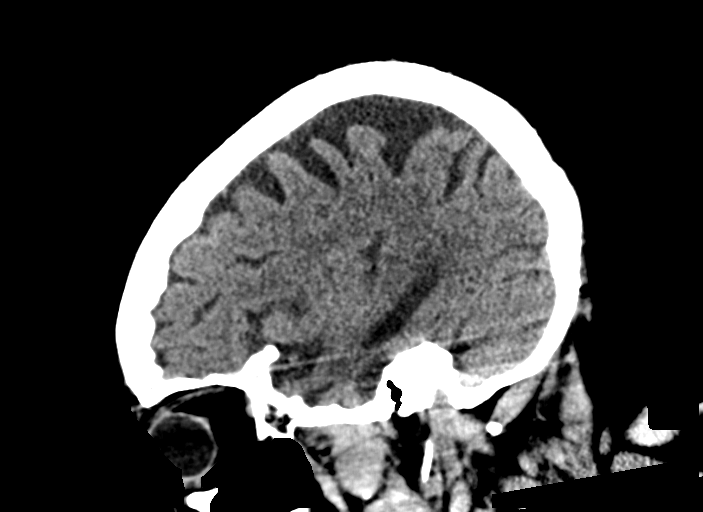
[im 41/62  brain]
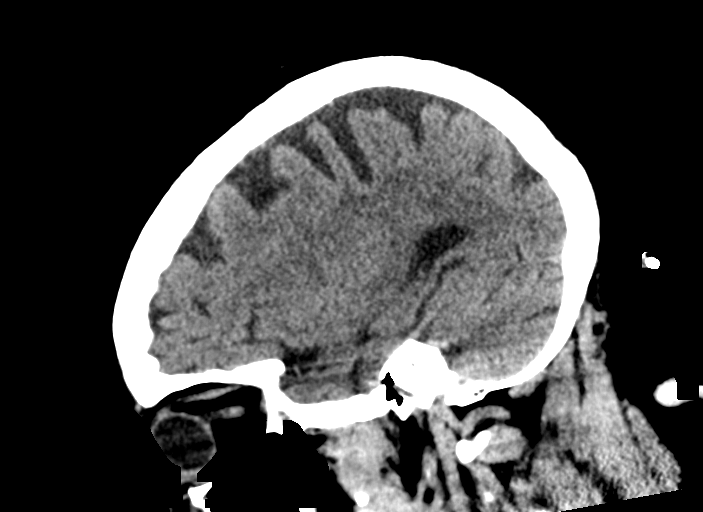

[15 of 47 positions shown; findings below may reference images not displayed]

FINDINGS: Brain: Stable age related cerebral atrophy, ventriculomegaly and
periventricular white matter disease. No extra-axial fluid
collections are identified. No CT findings for acute hemispheric
infarction or intracranial hemorrhage. No mass lesions. The
brainstem and cerebellum are normal.

Vascular: Vascular calcifications but no aneurysm or hyperdense
vessels.

Skull: No skull fracture or bone lesions.

Sinuses/Orbits: The paranasal sinuses and mastoid air cells are
clear. The globes are intact.

Other: No scalp lesions or hematoma.
IMPRESSION: 1. Stable age related cerebral atrophy, ventriculomegaly and
periventricular white matter disease.
2. No acute intracranial findings or skull fracture.

## 2021-10-16 DIAGNOSIS — F0150 Vascular dementia without behavioral disturbance: Secondary | ICD-10-CM | POA: Diagnosis not present

## 2021-11-14 DIAGNOSIS — F0150 Vascular dementia without behavioral disturbance: Secondary | ICD-10-CM | POA: Diagnosis not present

## 2021-12-15 DIAGNOSIS — F0150 Vascular dementia without behavioral disturbance: Secondary | ICD-10-CM | POA: Diagnosis not present

## 2022-03-14 DIAGNOSIS — F0150 Vascular dementia without behavioral disturbance: Secondary | ICD-10-CM | POA: Diagnosis not present

## 2022-04-20 DIAGNOSIS — M1611 Unilateral primary osteoarthritis, right hip: Secondary | ICD-10-CM | POA: Diagnosis not present

## 2022-04-20 DIAGNOSIS — M47812 Spondylosis without myelopathy or radiculopathy, cervical region: Secondary | ICD-10-CM | POA: Diagnosis not present

## 2022-04-20 DIAGNOSIS — R000 Tachycardia, unspecified: Secondary | ICD-10-CM | POA: Diagnosis not present

## 2022-04-20 DIAGNOSIS — S0990XA Unspecified injury of head, initial encounter: Secondary | ICD-10-CM | POA: Diagnosis not present

## 2022-04-20 DIAGNOSIS — F0390 Unspecified dementia without behavioral disturbance: Secondary | ICD-10-CM | POA: Diagnosis not present

## 2022-04-20 DIAGNOSIS — S299XXA Unspecified injury of thorax, initial encounter: Secondary | ICD-10-CM | POA: Diagnosis not present

## 2022-04-20 DIAGNOSIS — R0902 Hypoxemia: Secondary | ICD-10-CM | POA: Diagnosis not present

## 2022-04-20 DIAGNOSIS — Z043 Encounter for examination and observation following other accident: Secondary | ICD-10-CM | POA: Diagnosis not present

## 2022-04-20 DIAGNOSIS — W1839XA Other fall on same level, initial encounter: Secondary | ICD-10-CM | POA: Diagnosis not present

## 2022-04-20 DIAGNOSIS — M25551 Pain in right hip: Secondary | ICD-10-CM | POA: Diagnosis not present

## 2022-04-20 DIAGNOSIS — W19XXXA Unspecified fall, initial encounter: Secondary | ICD-10-CM | POA: Diagnosis not present

## 2022-04-20 DIAGNOSIS — R400 Somnolence: Secondary | ICD-10-CM | POA: Diagnosis not present

## 2022-04-27 ENCOUNTER — Other Ambulatory Visit: Payer: Self-pay

## 2022-04-27 ENCOUNTER — Encounter (HOSPITAL_BASED_OUTPATIENT_CLINIC_OR_DEPARTMENT_OTHER): Payer: Self-pay | Admitting: Emergency Medicine

## 2022-04-27 ENCOUNTER — Emergency Department (HOSPITAL_BASED_OUTPATIENT_CLINIC_OR_DEPARTMENT_OTHER)

## 2022-04-27 ENCOUNTER — Emergency Department (HOSPITAL_BASED_OUTPATIENT_CLINIC_OR_DEPARTMENT_OTHER): Admitting: Radiology

## 2022-04-27 ENCOUNTER — Ambulatory Visit
Admission: EM | Admit: 2022-04-27 | Discharge: 2022-04-27 | Disposition: A | Discharge status: Home / Self Care | Payer: Medicare HMO

## 2022-04-27 ENCOUNTER — Emergency Department (HOSPITAL_BASED_OUTPATIENT_CLINIC_OR_DEPARTMENT_OTHER)
Admission: EM | Admit: 2022-04-27 | Discharge: 2022-04-28 | Disposition: A | Discharge status: Home / Self Care | Attending: Emergency Medicine | Admitting: Emergency Medicine

## 2022-04-27 DIAGNOSIS — Y93E1 Activity, personal bathing and showering: Secondary | ICD-10-CM | POA: Diagnosis not present

## 2022-04-27 DIAGNOSIS — S32020A Wedge compression fracture of second lumbar vertebra, initial encounter for closed fracture: Secondary | ICD-10-CM | POA: Insufficient documentation

## 2022-04-27 DIAGNOSIS — M25552 Pain in left hip: Secondary | ICD-10-CM

## 2022-04-27 DIAGNOSIS — W19XXXA Unspecified fall, initial encounter: Secondary | ICD-10-CM

## 2022-04-27 DIAGNOSIS — S22070A Wedge compression fracture of T9-T10 vertebra, initial encounter for closed fracture: Secondary | ICD-10-CM | POA: Diagnosis not present

## 2022-04-27 DIAGNOSIS — S0990XA Unspecified injury of head, initial encounter: Secondary | ICD-10-CM | POA: Diagnosis not present

## 2022-04-27 DIAGNOSIS — Z043 Encounter for examination and observation following other accident: Secondary | ICD-10-CM | POA: Diagnosis not present

## 2022-04-27 DIAGNOSIS — S22080A Wedge compression fracture of T11-T12 vertebra, initial encounter for closed fracture: Secondary | ICD-10-CM | POA: Insufficient documentation

## 2022-04-27 DIAGNOSIS — S199XXA Unspecified injury of neck, initial encounter: Secondary | ICD-10-CM | POA: Diagnosis not present

## 2022-04-27 DIAGNOSIS — R9431 Abnormal electrocardiogram [ECG] [EKG]: Secondary | ICD-10-CM | POA: Diagnosis not present

## 2022-04-27 DIAGNOSIS — S3992XA Unspecified injury of lower back, initial encounter: Secondary | ICD-10-CM | POA: Diagnosis present

## 2022-04-27 DIAGNOSIS — F0390 Unspecified dementia without behavioral disturbance: Secondary | ICD-10-CM | POA: Diagnosis not present

## 2022-04-27 DIAGNOSIS — M48061 Spinal stenosis, lumbar region without neurogenic claudication: Secondary | ICD-10-CM | POA: Diagnosis not present

## 2022-04-27 DIAGNOSIS — K5730 Diverticulosis of large intestine without perforation or abscess without bleeding: Secondary | ICD-10-CM | POA: Diagnosis not present

## 2022-04-27 DIAGNOSIS — M1712 Unilateral primary osteoarthritis, left knee: Secondary | ICD-10-CM | POA: Diagnosis not present

## 2022-04-27 DIAGNOSIS — W182XXA Fall in (into) shower or empty bathtub, initial encounter: Secondary | ICD-10-CM | POA: Insufficient documentation

## 2022-04-27 DIAGNOSIS — M47816 Spondylosis without myelopathy or radiculopathy, lumbar region: Secondary | ICD-10-CM | POA: Diagnosis not present

## 2022-04-27 DIAGNOSIS — S32030A Wedge compression fracture of third lumbar vertebra, initial encounter for closed fracture: Secondary | ICD-10-CM | POA: Insufficient documentation

## 2022-04-27 LAB — CBC WITH DIFFERENTIAL/PLATELET
Abs Immature Granulocytes: 0.03 10*3/uL (ref 0.00–0.07)
Basophils Absolute: 0 10*3/uL (ref 0.0–0.1)
Basophils Relative: 0 %
Eosinophils Absolute: 0.1 10*3/uL (ref 0.0–0.5)
Eosinophils Relative: 1 %
HCT: 43.2 % (ref 36.0–46.0)
Hemoglobin: 14.6 g/dL (ref 12.0–15.0)
Immature Granulocytes: 0 %
Lymphocytes Relative: 23 %
Lymphs Abs: 2.1 10*3/uL (ref 0.7–4.0)
MCH: 29.8 pg (ref 26.0–34.0)
MCHC: 33.8 g/dL (ref 30.0–36.0)
MCV: 88.2 fL (ref 80.0–100.0)
Monocytes Absolute: 1 10*3/uL (ref 0.1–1.0)
Monocytes Relative: 10 %
Neutro Abs: 6.1 10*3/uL (ref 1.7–7.7)
Neutrophils Relative %: 66 %
Platelets: 275 10*3/uL (ref 150–400)
RBC: 4.9 MIL/uL (ref 3.87–5.11)
RDW: 13.5 % (ref 11.5–15.5)
WBC: 9.3 10*3/uL (ref 4.0–10.5)
nRBC: 0 % (ref 0.0–0.2)

## 2022-04-27 LAB — BASIC METABOLIC PANEL
Anion gap: 8 (ref 5–15)
BUN: 12 mg/dL (ref 8–23)
CO2: 28 mmol/L (ref 22–32)
Calcium: 9 mg/dL (ref 8.9–10.3)
Chloride: 102 mmol/L (ref 98–111)
Creatinine, Ser: 0.7 mg/dL (ref 0.44–1.00)
GFR, Estimated: >60 mL/min (ref >60)
Glucose, Bld: 104 mg/dL — ABNORMAL HIGH (ref 70–99)
Potassium: 5 mmol/L (ref 3.5–5.1)
Sodium: 138 mmol/L (ref 135–145)

## 2022-04-27 MED ORDER — OXYCODONE-ACETAMINOPHEN 5-325 MG PO TABS: 1.0000 | ORAL_TABLET | Freq: Four times a day (QID) | ORAL | 0 refills | Status: DC | PRN

## 2022-04-27 MED ORDER — OXYCODONE-ACETAMINOPHEN 5-325 MG PO TABS: 1.0000 | ORAL_TABLET | Freq: Once | ORAL | Status: DC

## 2022-04-27 MED ORDER — OXYCODONE-ACETAMINOPHEN 5-325 MG PO TABS: 1.0000 | ORAL_TABLET | ORAL | Status: DC | PRN

## 2022-04-27 NOTE — ED Provider Notes (Signed)
[facility] Provider Note   CSN: [id number] Arrival date & time: 04/27/22  1045     History  Chief Complaint  Patient presents with   Lytle Michaels    Troyce Febo Gafford is a 82 y.o. female.  Level 5 caveat for dementia.  Patient here with daughter.  Patient had a fall in the shower about 1 week ago striking the back of her head from a standing position.  She was seen at an outside hospital where she had a negative CT head and C-spine and right hip x-ray.  Daughter reports since then she is not able to stand or ambulate on her left leg.  Left leg was not imaged at previous ED visit.  She screams in pain if the the leg is moved.  Daughter denies any new fall or injury.  She is at her mental baseline.  No blood thinner use.  No new fall.  Eating and drinking normally.  No vomiting, cough, fever, chest pain or shortness of breath.  Using Tylenol at home without relief.  Daughter reports she normally walks on her own without any assistance.  The history is provided by the patient and a relative. The history is limited by the condition of the patient.  Fall Pertinent negatives include no chest pain, no abdominal pain and no shortness of breath.       Home Medications Prior to Admission medications   Medication Sig Start Date End Date Taking? Authorizing Provider  citalopram (CELEXA) 10 MG/5ML suspension Take 10 mg by mouth daily.    [provider]  MAPAP ACETAMINOPHEN EXTRA STR 500 MG/15ML LIQD SMARTSIG:15 Milliliter(s) By Mouth Every 6 Hours PRN 04/21/22   [provider]  Melatonin 1 MG/4ML LIQD Take 15 mLs by mouth at bedtime as needed.    [provider]  omeprazole (FIRST-OMEPRAZOLE) 2 mg/mL SUSP oral suspension Take 10 mLs (20 mg total) by mouth daily. 10/17/19 12/16/19  Murlean Iba, MD      Allergies    Patient has no known allergies.    Review of Systems   Review of Systems  Unable to perform ROS: Dementia   Constitutional:  Negative for activity change, appetite change and fever.  HENT:  Negative for congestion and rhinorrhea.   Respiratory:  Negative for cough, chest tightness and shortness of breath.   Cardiovascular:  Negative for chest pain.  Gastrointestinal:  Negative for abdominal pain, nausea and vomiting.  Genitourinary:  Negative for dysuria.  Musculoskeletal:  Positive for arthralgias and myalgias.    Physical Exam Updated Vital Signs BP 116/61 (BP Location: Right Arm)   Pulse 80   Temp 98.1 F (36.7 C) (Tympanic)   Resp 19   Ht '5\' 1"'$  (1.549 m)   Wt 57.6 kg   SpO2 100%   BMI 24.00 kg/m  Physical Exam Vitals and nursing note reviewed.  Constitutional:      General: She is not in acute distress.    Appearance: She is well-developed.     Comments: Does not speak or answer questions, resting with eyes closed, mumbles  HENT:     Head: Normocephalic and atraumatic.     Mouth/Throat:     Pharynx: No oropharyngeal exudate.  Eyes:     Conjunctiva/sclera: Conjunctivae normal.     Pupils: Pupils are equal, round, and reactive to light.  Neck:     Comments: No meningismus. Cardiovascular:     Rate and Rhythm: Normal rate and regular rhythm.  Heart sounds: Normal heart sounds. No murmur heard. Pulmonary:     Effort: Pulmonary effort is normal. No respiratory distress.     Breath sounds: Normal breath sounds.  Abdominal:     Palpations: Abdomen is soft.     Tenderness: There is no abdominal tenderness. There is no guarding or rebound.  Musculoskeletal:        General: Tenderness present. Normal range of motion.     Cervical back: Normal range of motion and neck supple.     Comments: Pelvis is stable.  No apparent shortening or external rotation of left leg.  Discomfort with attempted ranging of hip.  Intact DP pulses.  No T or L-spine tenderness  Skin:    General: Skin is warm.  Neurological:     Mental Status: She is alert.     Motor: No abnormal muscle tone.      Comments: Demented at baseline, at baseline per daughter.  Does not follow commands. Moves all extremities equally.  Psychiatric:        Behavior: Behavior normal.     ED Results / Procedures / Treatments   Labs (all labs ordered are listed, but only abnormal results are displayed) Labs Reviewed  BASIC METABOLIC PANEL - Abnormal; Notable for the following components:      Result Value   Glucose, Bld 104 (*)    All other components within normal limits  CBC WITH DIFFERENTIAL/PLATELET  CBC WITH DIFFERENTIAL/PLATELET  URINALYSIS, ROUTINE W REFLEX MICROSCOPIC    EKG EKG Interpretation  Date/Time:  Monday April 27 2022 12:05:52 EST Ventricular Rate:  84 PR Interval:  153 QRS Duration: 94 QT Interval:  364 QTC Calculation: 431 R Axis:   9 Text Interpretation: Sinus rhythm Left ventricular hypertrophy No significant change was found Confirmed by Ezequiel Essex [phone]) on 04/27/2022 12:55:10 PM  Radiology DG Knee Complete 4 Views Left  Result Date: 04/27/2022 CLINICAL DATA:  Fall 1 week ago. EXAM: LEFT KNEE - COMPLETE 4+ VIEW COMPARISON:  None Available. FINDINGS: Mild-to-moderate peripheral medial and lateral compartment degenerative osteophytes. Moderate medial compartment and mild lateral compartment joint space narrowing. Moderate patellofemoral joint space narrowing and peripheral osteophytosis. Mild chronic enthesopathic change at the quadriceps and patellar insertions on the patella. No joint effusion.  No acute fracture or dislocation. 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 Electronically Signed   By: Yvonne Kendall M.D.   On: 04/27/2022 14:05   DG FEMUR MIN 2 VIEWS LEFT  Result Date: 04/27/2022 CLINICAL DATA:  Fall EXAM: LEFT FEMUR 2 VIEWS COMPARISON:  None Available. FINDINGS: No evidence of fracture or other focal bone lesions. Soft tissues are unremarkable. IMPRESSION: Negative. Electronically Signed   By:  Yetta Glassman M.D.   On: 04/27/2022 12:06   CT Head Wo Contrast  Result Date: 04/27/2022 CLINICAL DATA:  Multiple falls.  Head trauma, moderate-severe. EXAM: CT HEAD WITHOUT CONTRAST TECHNIQUE: Contiguous axial images were obtained from the base of the skull through the vertex without intravenous contrast. RADIATION DOSE REDUCTION: This exam was performed according to the departmental dose-optimization program which includes automated exposure control, adjustment of the mA and/or kV according to patient size and/or use of iterative reconstruction technique. COMPARISON:  04/20/2022 FINDINGS: Brain: Generalized atrophy. No sign of acute infarction, mass lesion, hemorrhage, hydrocephalus or extra-axial collection. Chronic small-vessel ischemic change of the cerebral hemispheric white matter. Vascular: There is atherosclerotic calcification of the major vessels at the base of the brain. Skull: No skull fracture. Sinuses/Orbits:  Clear/normal Other: None IMPRESSION: No acute or traumatic finding. Atrophy and chronic small-vessel ischemic change of the white matter. Electronically Signed   By: Nelson Chimes M.D.   On: 04/27/2022 12:01   CT PELVIS WO CONTRAST  Result Date: 04/27/2022 CLINICAL DATA:  Following week ago while getting out of shower with persistent pain. EXAM: CT PELVIS WITHOUT CONTRAST TECHNIQUE: Multidetector CT imaging of the pelvis was performed following the standard protocol without intravenous contrast. RADIATION DOSE REDUCTION: This exam was performed according to the departmental dose-optimization program which includes automated exposure control, adjustment of the mA and/or kV according to patient size and/or use of iterative reconstruction technique. COMPARISON:  None Available. FINDINGS: Urinary Tract: Preserved contour to the underdistended urinary bladder. No abnormal calcifications. Bowel: The visualized bowel demonstrates colonic diverticulosis of the sigmoid colon. The visualized  small large bowel are nondilated. Vascular/Lymphatic: Scattered vascular calcifications along the aorta and iliac vessels. No specific abnormal lymph node enlargement in the pelvis. Reproductive: No mass or other significant abnormality. Absence of the uterus. Other: No free fluid in the pelvis. Injection granulomas along the subcutaneous tissues of the pelvis posteriorly. Musculoskeletal: Osteopenia. Degenerative changes seen in the visualized lower lumbar spine with some disc bulging. Bridging osteophytes along the right sacroiliac joint. There is some sclerosis and small osteophytes along the left side as well. Slight concentric joint space loss of the right hip with small osteophytes. Mild joint space loss of the left hip. Hypertrophic changes along the pubic symphysis. No clear fracture or dislocation. IMPRESSION: Scattered degenerative changes with osteopenia. No fracture identified. No dislocation. Colonic diverticulosis. Electronically Signed   By: Jill Side M.D.   On: 04/27/2022 11:58   CT Cervical Spine Wo Contrast  Result Date: 04/27/2022 CLINICAL DATA:  [place] 1 week ago with trauma to the head and neck. EXAM: CT CERVICAL SPINE WITHOUT CONTRAST TECHNIQUE: Multidetector CT imaging of the cervical spine was performed without intravenous contrast. Multiplanar CT image reconstructions were also generated. RADIATION DOSE REDUCTION: This exam was performed according to the departmental dose-optimization program which includes automated exposure control, adjustment of the mA and/or kV according to patient size and/or use of iterative reconstruction technique. COMPARISON:  04/20/2022 FINDINGS: Alignment: No malalignment. Skull base and vertebrae: No regional fracture or focal bone lesion. Soft tissues and spinal canal: No traumatic soft tissue finding. Disc levels: The foramen magnum is widely patent. There is ordinary mild osteoarthritis of the C1-2 articulation but no encroachment upon the neural  structures. C2-3: Normal interspace. C3-4: Minimal uncovertebral hypertrophy. Mild facet degeneration on the left. No significant stenosis. C4-5: Mild uncovertebral hypertrophy. Mild facet osteoarthritis. No compressive stenosis. C5-6: Endplate osteophytes. Mild bony foraminal narrowing left more than right. C6-7: Endplate osteophytes.  Mild bilateral foraminal narrowing. C7-T1: Normal interspace. No change since the prior exam. Upper chest: Negative Other: None IMPRESSION: No acute or traumatic finding. Mild degenerative changes as outlined above. No change since the study of 04/20/2022. Electronically Signed   By: Nelson Chimes M.D.   On: 04/27/2022 11:57    Procedures Procedures    Medications Ordered in ED Medications - No data to display  ED Course/ Medical Decision Making/ A&P                             Medical Decision Making Amount and/or Complexity of Data Reviewed Labs: ordered. Decision-making details documented in ED Course. Radiology: ordered and independent interpretation performed. Decision-making details documented in ED Course.  ECG/medicine tests: ordered and independent interpretation performed. Decision-making details documented in ED Course.  Patient had a fall 1 week ago injuring her head.  Imaging at outside hospital was negative.  Has had difficulty walking with left leg pain since.  Patient has dementia and give any history.  Family think she is having pain with range of motion of her left hip and difficulty walking.  They show a video on the family's phone where patient is having trouble walking even with her walker which is not her baseline.  CT head and C-spine are negative for acute traumatic injury.  CT hip was obtained which is negative for fracture.  Results reviewed interpreted by me.  Basic lab work is obtained to expand workup to determine if there is another etiology to explain patient's change in status.  Labs are reassuring.  Urinalysis  pending.  Awaiting ambulatory trial at shift change with likely discharge home if able to ambulate without assistance per her baseline.  Family feels she is mentally at her baseline.  Urinalysis is pending.  Will also obtain CT of lumbar spine to further evaluate for any possible traumatic injury.  Care to be transferred at shift change       Final Clinical Impression(s) / ED Diagnoses Final diagnoses:  None    Rx / DC Orders ED Discharge Orders     None         Nazir Hacker, Annie Main, MD 04/27/22 1615

## 2022-04-27 NOTE — ED Notes (Signed)
Pt baseline of dementia, but pleasant. Family at bedside. Pt fell at home x1 week ago, no change in LOC or orientation.

## 2022-04-27 NOTE — ED Provider Notes (Signed)
[facility]    CSN: [id number] Arrival date & time: 04/27/22  0841      History   Chief Complaint Chief Complaint  Patient presents with   Fall    HPI Tonya Cortez is a 82 y.o. female.   Patient presents for further evaluation after a fall that occurred approximately 1 week ago.  Patient presents with 2 family members who help provide most of history given patient's history of dementia.  Reports that she fell backwards in the shower approximately 1 week ago and landed on the left side and hit her head.  They deny any loss of consciousness.  She does not take any blood thinning medications.  She was originally evaluated the emergency department when fall first happened.  They did multiple different scans including the head, chest, right hip that showed no acute abnormalities.  Although, family members report that she has been complaining of left hip pain and has been favoring the left hip ever since fall occurred.  That hip was not imaged per family members.  They report that she typically gets around without any help before the fall occurred.  Ever since the fall, she has been favoring the left hip and has been having to walk with a walker and assistance.  They have given her Tylenol with minimal improvement.   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  Right colonic mucosa appeared normal. adenomatous   COLONOSCOPY N/A 01/17/2014   normal rectum, scattered left-sided diverticula, 2 diminutive polyps at cecum, tubular adenomas. Surveillance if health permits.    ESOPHAGEAL DILATION  10/16/2019   Procedure: ESOPHAGEAL DILATION;  Surgeon: Daneil Dolin, MD;  Location: [facility];  Service: Endoscopy;;   ESOPHAGOGASTRODUODENOSCOPY (EGD) WITH PROPOFOL N/A 10/16/2019   Procedure: ESOPHAGOGASTRODUODENOSCOPY (EGD) WITH PROPOFOL;  Surgeon: Daneil Dolin, MD;  Location: [facility];  Service: Endoscopy;  Laterality: N/A;  possible dilation   MASTECTOMY Left    abnormal mass in breast, non-cancerous per patient.    MASTECTOMY      OB History     Gravida  5   Para  5   Term  3   Preterm  2   AB      Living  3      SAB      IAB      Ectopic      Multiple      Live Births               Home Medications    Prior to Admission medications   Medication Sig Start Date End Date Taking? Authorizing Provider  MAPAP ACETAMINOPHEN EXTRA STR 500 MG/15ML LIQD SMARTSIG:15 Milliliter(s) By Mouth Every 6  Hours PRN 04/21/22  Yes [provider]  citalopram (CELEXA) 10 MG/5ML suspension Take 10 mg by mouth daily.    [provider]  Melatonin 1 MG/4ML LIQD Take 15 mLs by mouth at bedtime as needed.    [provider]  omeprazole (FIRST-OMEPRAZOLE) 2 mg/mL SUSP oral suspension Take 10 mLs (20 mg total) by mouth daily. 10/17/19 12/16/19  Murlean Iba, MD    Family History Family History  Problem Relation Age of Onset   Colon cancer Neg Hx     Social History Social History   Tobacco Use   Smoking status: Never   Smokeless tobacco: Never  Substance Use Topics   Alcohol use: No   Drug use: No     Allergies   Patient has no known allergies.   Review of Systems Review of Systems Per HPI  Physical Exam Triage Vital Signs ED Triage Vitals  Enc  Vitals Group     BP 04/27/22 0945 134/85     Pulse Rate 04/27/22 0950 88     Resp 04/27/22 0945 16     Temp 04/27/22 0945 98 F (36.7 C)     Temp Source 04/27/22 0945 Oral     SpO2 04/27/22 0950 98 %     Weight --      Height --      Head Circumference --      Peak Flow --      Pain Score --      Pain Loc --      Pain Edu? --      Excl. in [place]? --    No data found.  Updated Vital Signs BP 134/85 (BP Location: Right Arm)   Pulse 88   Temp 98 F (36.7 C) (Oral)   Resp 16   SpO2 98%   Visual Acuity Right Eye Distance:   Left Eye Distance:   Bilateral Distance:    Right Eye Near:   Left Eye Near:    Bilateral Near:     Physical Exam Constitutional:      General: She is not in acute distress.    Appearance: Normal appearance. She is not toxic-appearing or diaphoretic.     Comments: Patient sitting in a wheelchair asleep.  Limited evaluation due to patient's mobility.  HENT:     Head: Normocephalic and atraumatic.  Eyes:     Extraocular Movements: Extraocular movements intact.     Conjunctiva/sclera: Conjunctivae normal.  Pulmonary:     Effort: Pulmonary effort is normal.  Musculoskeletal:     Comments: Patient has tenderness to palpation to left lateral hip.  Limited evaluation of skin given patient is wheelchair-bound at this time.  Limited evaluation of skin shows no obvious swelling or abnormality of the skin.  Neurological:     General: No focal deficit present.     Mental Status: She is alert and oriented to person, place, and time. Mental status is at baseline.  Psychiatric:        Mood and Affect: Mood normal.        Behavior: Behavior normal.        Thought Content: Thought content normal.        Judgment: Judgment normal.      UC Treatments / Results  Labs (all labs ordered are listed, but only abnormal results are displayed) Labs Reviewed - No data to display  EKG   Radiology No results found.  Procedures Procedures (including critical  care time)  Medications  Ordered in UC Medications - No data to display  Initial Impression / Assessment and Plan / UC Course  I have reviewed the triage vital signs and the nursing notes.  Pertinent labs & imaging results that were available during my care of the patient were reviewed by me and considered in my medical decision making (see chart for details).     Do think the patient needs x-ray of the left hip given that it was not performed at previous ED visit and patient is now favoring left hip with tenderness to palpation.  Given patient's limited mobility and limited x-ray availabilities here in urgent care, unable to safely image left hip.  Therefore, family members were advised to take her to the ER for further evaluation and management to have imaging of the hip completed.  They were agreeable with this plan.  Patient stable at discharge.  Agree with patient's family members transporting her to the ER. Final Clinical Impressions(s) / UC Diagnoses   Final diagnoses:  Fall, initial encounter  Left hip pain     Discharge Instructions      Please go to the emergency department as soon as you leave urgent care for further evaluation and management.    ED Prescriptions   None    PDMP not reviewed this encounter.   Teodora Medici, [place] 04/27/22 1012

## 2022-04-27 NOTE — Discharge Instructions (Addendum)
Your CT imaging revealed multiple spinal fractures in the thoracic and lumbar spine.  Neurosurgery was consulted and recommended outpatient follow-up, fitting for brace which was done in the emergency department.  Opiates have been prescribed for pain control.  An order for home health physical therapy and a nursing aide has been placed.  Recommend you call her PCP in the morning to discuss longer-term pain control.  Follow-up with [place] neurosurgery in clinic in 2 weeks. Home hospice should be able to prescribe longer term opiate pain control.

## 2022-04-27 NOTE — Discharge Instructions (Signed)
Please go to the emergency department as soon as you leave urgent care for further evaluation and management. ?

## 2022-04-27 NOTE — ED Notes (Signed)
Patient is being discharged from the Urgent Care and sent to the Emergency Department via private vehicle . Per [place] patient is in need of higher level of care due to further evaluation. Patient is aware and verbalizes understanding of plan of care.  Vitals:   04/27/22 0945 04/27/22 0950  BP: 134/85   Pulse:  88  Resp: 16   Temp: 98 F (36.7 C)   SpO2:  98%

## 2022-04-27 NOTE — ED Triage Notes (Signed)
Pt daughter states pt fell backwards in shower 1 week ago. Daughter states when she tried to get her off of the ground the pt left side of body was shaking. States pt had bitten her tongue during the fall. Daughter states pt now is non-weight bearing on left side. States prior to fall pt was able to walk independently, perform ADL like shoe tying. But since fall pt has become wheelchair bound.

## 2022-04-27 NOTE — ED Notes (Signed)
TLSO brace applied by company. Md notified

## 2022-04-27 NOTE — Progress Notes (Signed)
Orthopedic Tech Progress Note Patient Details:  Tonya Cortez [birth year] [id number]  Called in order to [employer] for a TLSO BRACE   Patient ID: Tonya Cortez, female   DOB: 09-Nov-1940, 82 y.o.   MRN: [id number]  [name] 04/27/2022, 6:33 PM

## 2022-04-27 NOTE — ED Triage Notes (Signed)
Pt arrived with daughter from [place]. Pt's daughter reports pt had fall 1 week ago while getting out of the shower. Pt's daughter states she fell straight back from standing position. Pt hit head when fall occurred but denies LOC.   Prior to fall pt ambulated independently but has been unable to bear weight on L side since the fall but screams out in pain if R leg is moved, has been in no obvious distress while at rest.   Pt has hx dementia and is at baseline per daughter/caregiver which is mostly non-verbal, not alert to person, place, time or event, but sometimes repeats sentences.

## 2022-04-27 NOTE — ED Provider Notes (Signed)
  Physical Exam  BP 106/73   Pulse 77   Temp 98.1 F (36.7 C) (Tympanic)   Resp 20   Ht '5\' 1"'$  (1.549 m)   Wt 57.6 kg   SpO2 100%   BMI 24.00 kg/m     Procedures  Procedures  ED Course / MDM    Medical Decision Making Amount and/or Complexity of Data Reviewed Labs: ordered. Radiology: ordered. ECG/medicine tests: ordered.  Risk Prescription drug management.   [name], fall one week ago, worsening hip pain, difficulty walking, CT negative. Waiting on ambulatory trial and labs.   CT Lumbar spine added on, positive for acute T10, T12, L2 and L3 compression fractures. Neurosurgery consulted, recommended TLSO brace, follow-up in clinic in two weeks. Pt already on home hospice care. Discussed with family their level of comfort managing continuing to care for her at home, and after consideration, family would like to continue to care for her at home with home hospice in place. Home health order placed for PT to come by and assist. Short course of opiates prescribed. Stable for discharge into the care of family.       Regan Lemming, MD 04/28/22 [phone]

## 2022-05-06 DIAGNOSIS — M8008XA Age-related osteoporosis with current pathological fracture, vertebra(e), initial encounter for fracture: Secondary | ICD-10-CM | POA: Diagnosis not present

## 2022-08-19 DIAGNOSIS — F0150 Vascular dementia without behavioral disturbance: Secondary | ICD-10-CM | POA: Diagnosis not present

## 2022-11-08 DIAGNOSIS — I10 Essential (primary) hypertension: Secondary | ICD-10-CM | POA: Diagnosis not present

## 2022-11-08 DIAGNOSIS — R258 Other abnormal involuntary movements: Secondary | ICD-10-CM | POA: Diagnosis not present

## 2022-11-08 DIAGNOSIS — R0902 Hypoxemia: Secondary | ICD-10-CM | POA: Diagnosis not present

## 2022-11-08 DIAGNOSIS — R404 Transient alteration of awareness: Secondary | ICD-10-CM | POA: Diagnosis not present

## 2022-11-08 DIAGNOSIS — Z20822 Contact with and (suspected) exposure to covid-19: Secondary | ICD-10-CM | POA: Diagnosis not present

## 2022-11-08 DIAGNOSIS — R569 Unspecified convulsions: Secondary | ICD-10-CM | POA: Diagnosis not present

## 2023-02-03 ENCOUNTER — Emergency Department (HOSPITAL_COMMUNITY): Payer: Self-pay

## 2023-02-03 ENCOUNTER — Encounter (HOSPITAL_COMMUNITY): Payer: Self-pay

## 2023-02-03 ENCOUNTER — Inpatient Hospital Stay (HOSPITAL_COMMUNITY): Payer: Self-pay

## 2023-02-03 ENCOUNTER — Other Ambulatory Visit: Payer: Self-pay

## 2023-02-03 ENCOUNTER — Observation Stay (HOSPITAL_COMMUNITY)
Admission: EM | Admit: 2023-02-03 | Discharge: 2023-02-04 | Disposition: A | Discharge status: Home / Self Care | Attending: Internal Medicine | Admitting: Internal Medicine

## 2023-02-03 DIAGNOSIS — R9431 Abnormal electrocardiogram [ECG] [EKG]: Secondary | ICD-10-CM | POA: Diagnosis not present

## 2023-02-03 DIAGNOSIS — R569 Unspecified convulsions: Principal | ICD-10-CM

## 2023-02-03 DIAGNOSIS — F0390 Unspecified dementia without behavioral disturbance: Secondary | ICD-10-CM | POA: Insufficient documentation

## 2023-02-03 DIAGNOSIS — R7303 Prediabetes: Secondary | ICD-10-CM | POA: Insufficient documentation

## 2023-02-03 DIAGNOSIS — I517 Cardiomegaly: Secondary | ICD-10-CM | POA: Diagnosis not present

## 2023-02-03 DIAGNOSIS — R2681 Unsteadiness on feet: Secondary | ICD-10-CM | POA: Insufficient documentation

## 2023-02-03 DIAGNOSIS — R55 Syncope and collapse: Secondary | ICD-10-CM | POA: Diagnosis not present

## 2023-02-03 DIAGNOSIS — R531 Weakness: Secondary | ICD-10-CM | POA: Diagnosis not present

## 2023-02-03 DIAGNOSIS — Z853 Personal history of malignant neoplasm of breast: Secondary | ICD-10-CM | POA: Insufficient documentation

## 2023-02-03 LAB — CBC WITH DIFFERENTIAL/PLATELET
Abs Immature Granulocytes: 0.12 10*3/uL — ABNORMAL HIGH (ref 0.00–0.07)
Basophils Absolute: 0 10*3/uL (ref 0.0–0.1)
Basophils Relative: 1 %
Eosinophils Absolute: 0 10*3/uL (ref 0.0–0.5)
Eosinophils Relative: 1 %
HCT: 43.9 % (ref 36.0–46.0)
Hemoglobin: 14.5 g/dL (ref 12.0–15.0)
Immature Granulocytes: 2 %
Lymphocytes Relative: 18 %
Lymphs Abs: 1.2 10*3/uL (ref 0.7–4.0)
MCH: 30.3 pg (ref 26.0–34.0)
MCHC: 33 g/dL (ref 30.0–36.0)
MCV: 91.8 fL (ref 80.0–100.0)
Monocytes Absolute: 0.4 10*3/uL (ref 0.1–1.0)
Monocytes Relative: 6 %
Neutro Abs: 4.7 10*3/uL (ref 1.7–7.7)
Neutrophils Relative %: 72 %
Platelets: 182 10*3/uL (ref 150–400)
RBC: 4.78 MIL/uL (ref 3.87–5.11)
RDW: 13.8 % (ref 11.5–15.5)
WBC: 6.4 10*3/uL (ref 4.0–10.5)
nRBC: 0 % (ref 0.0–0.2)

## 2023-02-03 LAB — GLUCOSE, CAPILLARY: Glucose-Capillary: 78 mg/dL (ref 70–99)

## 2023-02-03 LAB — COMPREHENSIVE METABOLIC PANEL
ALT: 14 U/L (ref 0–44)
AST: 20 U/L (ref 15–41)
Albumin: 3.5 g/dL (ref 3.5–5.0)
Alkaline Phosphatase: 54 U/L (ref 38–126)
Anion gap: 7 (ref 5–15)
BUN: 11 mg/dL (ref 8–23)
CO2: 28 mmol/L (ref 22–32)
Calcium: 9 mg/dL (ref 8.9–10.3)
Chloride: 106 mmol/L (ref 98–111)
Creatinine, Ser: 0.68 mg/dL (ref 0.44–1.00)
GFR, Estimated: >60 mL/min (ref >60)
Glucose, Bld: 117 mg/dL — ABNORMAL HIGH (ref 70–99)
Potassium: 4 mmol/L (ref 3.5–5.1)
Sodium: 141 mmol/L (ref 135–145)
Total Bilirubin: 1 mg/dL (ref 0.3–1.2)
Total Protein: 6.6 g/dL (ref 6.5–8.1)

## 2023-02-03 LAB — TROPONIN I (HIGH SENSITIVITY)
Troponin I (High Sensitivity): 3 ng/L (ref <18)
Troponin I (High Sensitivity): 7 ng/L (ref <18)

## 2023-02-03 MED ORDER — DIAZEPAM 5 MG/ML IJ SOLN
5.0000 mg | Freq: Once | INTRAMUSCULAR | Status: AC
  Administered 2023-02-03: 5 mg via INTRAVENOUS
  Filled 2023-02-03: qty 2

## 2023-02-03 MED ORDER — LORAZEPAM 2 MG/ML IJ SOLN: 1.0000 mg | Freq: Once | INTRAMUSCULAR | Status: DC | PRN

## 2023-02-03 MED ORDER — SODIUM CHLORIDE 0.9 % IV BOLUS
1000.0000 mL | Freq: Once | INTRAVENOUS | Status: AC
  Administered 2023-02-03: 1000 mL via INTRAVENOUS

## 2023-02-03 MED ORDER — LEVETIRACETAM IN NACL 1000 MG/100ML IV SOLN
1000.0000 mg | Freq: Once | INTRAVENOUS | Status: AC
  Administered 2023-02-03: 1000 mg via INTRAVENOUS
  Filled 2023-02-03: qty 100

## 2023-02-03 MED ORDER — POLYETHYLENE GLYCOL 3350 17 G PO PACK: 17.0000 g | PACK | Freq: Every day | ORAL | Status: DC | PRN

## 2023-02-03 MED ORDER — INSULIN ASPART 100 UNIT/ML IJ SOLN
0.0000 [IU] | Freq: Every day | INTRAMUSCULAR | Status: DC
  Filled 2023-02-03: qty 0.05

## 2023-02-03 MED ORDER — SODIUM CHLORIDE 0.9% FLUSH
3.0000 mL | Freq: Two times a day (BID) | INTRAVENOUS | Status: DC
Start: 2023-02-03 — End: 2023-02-04
  Administered 2023-02-03 – 2023-02-04 (×2): 3 mL via INTRAVENOUS

## 2023-02-03 MED ORDER — ENOXAPARIN SODIUM 40 MG/0.4ML IJ SOSY
40.0000 mg | PREFILLED_SYRINGE | INTRAMUSCULAR | Status: DC
  Administered 2023-02-04: 40 mg via SUBCUTANEOUS
  Filled 2023-02-03: qty 0.4

## 2023-02-03 MED ORDER — INSULIN ASPART 100 UNIT/ML IJ SOLN
0.0000 [IU] | Freq: Three times a day (TID) | INTRAMUSCULAR | Status: DC
  Filled 2023-02-03: qty 0.09

## 2023-02-03 MED ORDER — ACETAMINOPHEN 325 MG PO TABS: 650.0000 mg | ORAL_TABLET | Freq: Four times a day (QID) | ORAL | Status: DC | PRN

## 2023-02-03 MED ORDER — ACETAMINOPHEN 650 MG RE SUPP: 650.0000 mg | Freq: Four times a day (QID) | RECTAL | Status: DC | PRN

## 2023-02-03 MED ORDER — LEVETIRACETAM IN NACL 500 MG/100ML IV SOLN: 500.0000 mg | Freq: Two times a day (BID) | INTRAVENOUS | Status: DC

## 2023-02-03 NOTE — ED Provider Notes (Signed)
[facility] AT [facility] Provider Note   CSN: [id number] Arrival date & time: 02/03/23  1542     History  Chief Complaint  Patient presents with   Loss of Consciousness    Tonya Cortez is a 82 y.o. female hx of dementia, previous history of seizure-like activity here presenting with possible seizure versus syncope.  Patient was at home and living with family.  Patient was on the commode and apparently had an episode of seizure-like activity.  Patient apparently screamed and family went in there and patient had some possible convulsions.  Patient was seen at [facility] in August for similar episodes.  Patient did not appear to be started on any seizure medicine.  She had a CT head and labs that were unremarkable at that time.  The history is provided by the patient.       Home Medications Prior to Admission medications   Medication Sig Start Date End Date Taking? Authorizing Provider  citalopram (CELEXA) 10 MG/5ML suspension Take 10 mg by mouth daily.    [provider]  MAPAP ACETAMINOPHEN EXTRA STR 500 MG/15ML LIQD SMARTSIG:15 Milliliter(s) By Mouth Every 6 Hours PRN 04/21/22   [provider]  Melatonin 1 MG/4ML LIQD Take 15 mLs by mouth at bedtime as needed.    [provider]  omeprazole (FIRST-OMEPRAZOLE) 2 mg/mL SUSP oral suspension Take 10 mLs (20 mg total) by mouth daily. 10/17/19 12/16/19  Cleora Fleet, MD  oxyCODONE-acetaminophen (PERCOCET/ROXICET) 5-325 MG tablet Take 1 tablet by mouth every 6 (six) hours as needed for severe pain. 04/27/22   Ernie Avena, MD      Allergies    Patient has no known allergies.    Review of Systems   Review of Systems  Neurological:  Positive for seizures.  All other systems reviewed and are negative.   Physical Exam Updated Vital Signs BP 129/78   Pulse 90   Temp 97.9 F (36.6 C) (Oral)   Resp 15   SpO2 99%  Physical Exam Vitals and nursing  note reviewed.  Constitutional:      Comments: Chronic ill and altered  HENT:     Head: Normocephalic.     Nose: Nose normal.     Mouth/Throat:     Mouth: Mucous membranes are moist.  Eyes:     Extraocular Movements: Extraocular movements intact.     Pupils: Pupils are equal, round, and reactive to light.  Cardiovascular:     Rate and Rhythm: Normal rate and regular rhythm.     Pulses: Normal pulses.     Heart sounds: Normal heart sounds.  Pulmonary:     Effort: Pulmonary effort is normal.     Breath sounds: Normal breath sounds.  Abdominal:     General: Abdomen is flat.     Palpations: Abdomen is soft.  Musculoskeletal:        General: Normal range of motion.     Cervical back: Normal range of motion and neck supple.  Skin:    General: Skin is warm.     Capillary Refill: Capillary refill takes less than 2 seconds.  Neurological:     Comments: Patient is confused and altered.  Patient has no obvious eye deviation.  She is moving all extremities.  Psychiatric:        Mood and Affect: Mood normal.        Behavior: Behavior normal.     ED Results / Procedures / Treatments  Labs (all labs ordered are listed, but only abnormal results are displayed) Labs Reviewed  CBC WITH DIFFERENTIAL/PLATELET - Abnormal; Notable for the following components:      Result Value   Abs Immature Granulocytes 0.12 (*)    All other components within normal limits  COMPREHENSIVE METABOLIC PANEL - Abnormal; Notable for the following components:   Glucose, Bld 117 (*)    All other components within normal limits  URINALYSIS, ROUTINE W REFLEX MICROSCOPIC  TROPONIN I (HIGH SENSITIVITY)  TROPONIN I (HIGH SENSITIVITY)    EKG EKG Interpretation Date/Time:  Wednesday February 03 2023 16:03:29 EDT Ventricular Rate:  83 PR Interval:  153 QRS Duration:  75 QT Interval:  378 QTC Calculation: 445 R Axis:   -10  Text Interpretation: Normal sinus rhythm Left ventricular hypertrophy No significant  change since last tracing Confirmed by Richardean Canal [phone]) on 02/03/2023 4:32:24 PM  Radiology No results found.  Procedures Procedures    Medications Ordered in ED Medications  LORazepam (ATIVAN) injection 1 mg (has no administration in time range)  levETIRAcetam (KEPPRA) IVPB 1000 mg/100 mL premix (0 mg Intravenous Stopped 02/03/23 1705)  sodium chloride 0.9 % bolus 1,000 mL (0 mLs Intravenous Stopped 02/03/23 1815)    ED Course/ Medical Decision Making/ A&P                                 Medical Decision Making Tonya Cortez is a 82 y.o. female here presenting with altered mental status and seizure-like activity.  Patient had similar episode 2 months ago and was not started on any medicine.  Given that she had another episode of seizure-like activity will load with Keppra.  She had a CT head done 2 months ago and she did not have a fall today so we will hold off on CT head.  Will get labs and reassess.   6:47 PM Patient is still confused.  I reviewed patient's labs and they were unremarkable.  Daughter is at bedside and is concerned that she is not back to baseline.  I discussed case with Dr. Jerrell Belfast from neurology.  He recommend MRI brain and admission to [facility].  Patient.  He also recommend EEG as well.  Problems Addressed: Seizure [facility]): acute illness or injury  Amount and/or Complexity of Data Reviewed Labs: ordered. Decision-making details documented in ED Course. Radiology: ordered and independent interpretation performed. Decision-making details documented in ED Course. ECG/medicine tests: ordered and independent interpretation performed. Decision-making details documented in ED Course.  Risk Prescription drug management. Decision regarding hospitalization.    Final Clinical Impression(s) / ED Diagnoses Final diagnoses:  Seizure [facility])    Rx / DC Orders ED Discharge Orders     None         Charlynne Pander, MD 02/03/23 [phone]

## 2023-02-03 NOTE — ED Notes (Signed)
RN just saw MD Charmayne Sheer in hallway and informed him that pt was able to have MRI completed. No new orders at this time

## 2023-02-03 NOTE — Assessment & Plan Note (Addendum)
Please see HPI.  Patient seems to have had atypical seizure that lasted less than 3-minute, with return to neurologic baseline which is rather poor to begin with.  Will workup simultaneously for syncope, which may be related to voiding versus seizure.  Monitor glucose as well.  Neurology has been consulted, requesting transfer to [facility] with plan for EEG over there. Mri brain pending. S/p keppra X 1. Check echo  Patinet had one episode like this before in August - patinet was in bed at the time. However, there eseems to have been vocal gurgling at that time as well. Will monitor pulse. Ox. Check Cxr. And swallow eval.

## 2023-02-03 NOTE — ED Notes (Signed)
Pt to MRI at this time.

## 2023-02-03 NOTE — H&P (Signed)
History and Physical    Patient: Tonya Cortez ZOX:[id number] DOB: [birth year] DOA: 02/03/2023 DOS: the patient was seen and examined on 02/03/2023 PCP: Mariel Kansky., NP  Patient coming from: Home  Chief Complaint:  Chief Complaint  Patient presents with   Loss of Consciousness   HPI: Tonya Cortez is a 82 y.o. female with medical history significant of dementia.  Patient typically does not talk much.  Patient needs assistance with all activities of daily living and often keeps eyes shut.  Maybe she will say 1-2 word about meaningful conversation is not possible.  Further history is obtained from the Tonya Cortez who is the niece of the patient's daughter.  Around 2 PM: Patient had been on the commode for 5 minutes, was awake. Normal for her. Patient does keep eyes closed. Niece tried to lift up patient. Patient seemed to resist. Remained seated for  2 more minutes. All of sudden open eyes looking in corner. Then eyes got big. Started screaming. YEEEE. .. AAAA... and then patient went to convulsion - eyes closed. Shaking all four of her body. Lasted about 30 seconds. Then went limp. Was held on the commode.   Then got down on the floor. Stomach was sucked all the way in. Patient did not seem to breath. There was gurgling. Thick saliva. Rolled to side. Performed Cpr. Did mouth to mouth. When chest compression started - her eyes opened.  Woke up within 2-3 minutes total. EMS was there by then.  Last ate this morning about 7:30 AM  Found to be keeping her eyes closed, no further distressed.  No further seizure activities.  Patient unable to give history.  Medical evaluation is sought.  Neurology has been consulted who are requesting transfer to [facility]   Review of Systems: unable to review all systems due to the inability of the patient to answer questions. Past Medical History:  Diagnosis Date   Arthritis    Bell's palsy    Borderline diabetes    Cancer (HCC)     breast   Dementia (HCC)    Diverticulosis    Hypercholesterolemia    Memory difficulties    Unsteady gait    Vertigo    Past Surgical History:  Procedure Laterality Date   ABDOMINAL HYSTERECTOMY     BREAST SURGERY     COLONOSCOPY  10/29/2008   WJX:[id number] examination with some left-sided diverticula/Diminutive cecal polyp, status post cold biopsy removal.  Right colonic mucosa appeared normal. adenomatous   COLONOSCOPY N/A 01/17/2014   normal rectum, scattered left-sided diverticula, 2 diminutive polyps at cecum, tubular adenomas. Surveillance if health permits.    ESOPHAGEAL DILATION  10/16/2019   Procedure: ESOPHAGEAL DILATION;  Surgeon: Corbin Ade, MD;  Location: [facility];  Service: Endoscopy;;   ESOPHAGOGASTRODUODENOSCOPY (EGD) WITH PROPOFOL N/A 10/16/2019   Procedure: ESOPHAGOGASTRODUODENOSCOPY (EGD) WITH PROPOFOL;  Surgeon: Corbin Ade, MD;  Location: [facility] ENDO SUITE;  Service: Endoscopy;  Laterality: N/A;  possible dilation   MASTECTOMY Left    abnormal mass in breast, non-cancerous per patient.    MASTECTOMY     Social History:  reports that she has never smoked. She has never used smokeless tobacco. She reports that she does not drink alcohol and does not use drugs.  No Known Allergies  Family History  Problem Relation Age of Onset   Colon cancer Neg Hx     Prior to Admission medications   Medication Sig Start Date End Date Taking? Authorizing Provider  citalopram (CELEXA) 10 MG/5ML suspension Take 10 mg by mouth daily.    [provider]  MAPAP ACETAMINOPHEN EXTRA STR 500 MG/15ML LIQD SMARTSIG:15 Milliliter(s) By Mouth Every 6 Hours PRN 04/21/22   [provider]  Melatonin 1 MG/4ML LIQD Take 15 mLs by mouth at bedtime as needed.    [provider]  omeprazole (FIRST-OMEPRAZOLE) 2 mg/mL SUSP oral suspension Take 10 mLs (20 mg total) by mouth daily. 10/17/19 12/16/19  Cleora Fleet, MD  oxyCODONE-acetaminophen  (PERCOCET/ROXICET) 5-325 MG tablet Take 1 tablet by mouth every 6 (six) hours as needed for severe pain. 04/27/22   Ernie Avena, MD    Physical Exam: Vitals:   02/03/23 1601 02/03/23 1929  BP: 129/78   Pulse: 90 78  Resp: 15 20  Temp: 97.9 F (36.6 C) 98.6 F (37 C)  TempSrc: Oral Oral  SpO2: 99% 97%   General: Patient keeps eyes closed, appears to be in no distress, does not follow directions.  Says 1-2 words incomprehensibly in response to verbal stimulation.  Does not appear to be lethargic Respiratory exam: Bilateral intravesicular Cardiovascular exam S1-S2 normal Abdomen all quadrants soft nontender Extremities warm without edema No focal motor deficit is appreciated.  No focal spinal tenderness is elicited Data Reviewed:  Labs on Admission:  Results for orders placed or performed during the hospital encounter of 02/03/23 (from the past 24 hour(s))  CBC with Differential     Status: Abnormal   Collection Time: 02/03/23  4:33 PM  Result Value Ref Range   WBC 6.4 4.0 - 10.5 K/uL   RBC 4.78 3.87 - 5.11 MIL/uL   Hemoglobin 14.5 12.0 - 15.0 g/dL   HCT 57.8 46.9 - 62.9 %   MCV 91.8 80.0 - 100.0 fL   MCH 30.3 26.0 - 34.0 pg   MCHC 33.0 30.0 - 36.0 g/dL   RDW 52.8 41.3 - 24.4 %   Platelets 182 150 - 400 K/uL   nRBC 0.0 0.0 - 0.2 %   Neutrophils Relative % 72 %   Neutro Abs 4.7 1.7 - 7.7 K/uL   Lymphocytes Relative 18 %   Lymphs Abs 1.2 0.7 - 4.0 K/uL   Monocytes Relative 6 %   Monocytes Absolute 0.4 0.1 - 1.0 K/uL   Eosinophils Relative 1 %   Eosinophils Absolute 0.0 0.0 - 0.5 K/uL   Basophils Relative 1 %   Basophils Absolute 0.0 0.0 - 0.1 K/uL   Immature Granulocytes 2 %   Abs Immature Granulocytes 0.12 (H) 0.00 - 0.07 K/uL  Comprehensive metabolic panel     Status: Abnormal   Collection Time: 02/03/23  4:33 PM  Result Value Ref Range   Sodium 141 135 - 145 mmol/L   Potassium 4.0 3.5 - 5.1 mmol/L   Chloride 106 98 - 111 mmol/L   CO2 28 22 - 32 mmol/L    Glucose, Bld 117 (H) 70 - 99 mg/dL   BUN 11 8 - 23 mg/dL   Creatinine, Ser 0.10 0.44 - 1.00 mg/dL   Calcium 9.0 8.9 - 27.2 mg/dL   Total Protein 6.6 6.5 - 8.1 g/dL   Albumin 3.5 3.5 - 5.0 g/dL   AST 20 15 - 41 U/L   ALT 14 0 - 44 U/L   Alkaline Phosphatase 54 38 - 126 U/L   Total Bilirubin 1.0 0.3 - 1.2 mg/dL   GFR, Estimated >53 >66 mL/min   Anion gap 7 5 - 15  Troponin I (High Sensitivity)  Status: None   Collection Time: 02/03/23  4:33 PM  Result Value Ref Range   Troponin I (High Sensitivity) 3 <18 ng/L   Basic Metabolic Panel: Recent Labs  Lab 02/03/23 1633  NA 141  K 4.0  CL 106  CO2 28  GLUCOSE 117*  BUN 11  CREATININE 0.68  CALCIUM 9.0   Liver Function Tests: Recent Labs  Lab 02/03/23 1633  AST 20  ALT 14  ALKPHOS 54  BILITOT 1.0  PROT 6.6  ALBUMIN 3.5   No results for input(s): "LIPASE", "AMYLASE" in the last 168 hours. No results for input(s): "AMMONIA" in the last 168 hours. CBC: Recent Labs  Lab 02/03/23 1633  WBC 6.4  NEUTROABS 4.7  HGB 14.5  HCT 43.9  MCV 91.8  PLT 182   Cardiac Enzymes: Recent Labs  Lab 02/03/23 1633  TROPONINIHS 3    BNP (last 3 results) No results for input(s): "PROBNP" in the last 8760 hours. CBG: No results for input(s): "GLUCAP" in the last 168 hours.  Radiological Exams on Admission:  No results found.  EKG: Independently reviewed. Artifact nsr suspected.   Assessment and Plan: * Seizures (HCC) Please see HPI.  Patient seems to have had atypical seizure that lasted less than 3-minute, with return to neurologic baseline which is rather poor to begin with.  Will workup simultaneously for syncope, which may be related to voiding versus seizure.  Monitor glucose as well.  Neurology has been consulted, requesting transfer to [facility] with plan for EEG over there. Mri brain pending. S/p keppra X 1.   Patinet had one episode like this before in August - patinet was in bed at the time. However, there  eseems to have been vocal gurgling at that time as well. Will monitor pulse. Ox. Check Cxr. And swallow eval.  Patinet not on any chornic meds per family.    Advance Care Planning:   Code Status: Do not attempt resuscitation (DNR) PRE-ARREST INTERVENTIONS DESIRED status given to me by daughter Burna Mortimer at the bedside.  Consults: neurology. Dr. Wilford Corner.  Family Communication: daughter at bedside. Updated.  Severity of Illness: The appropriate patient status for this patient is OBSERVATION. Observation status is judged to be reasonable and necessary in order to provide the required intensity of service to ensure the patient's safety. The patient's presenting symptoms, physical exam findings, and initial radiographic and laboratory data in the context of their medical condition is felt to place them at decreased risk for further clinical deterioration. Furthermore, it is anticipated that the patient will be medically stable for discharge from the hospital within 2 midnights of admission.   Author: Nolberto Hanlon, MD 02/03/2023 8:02 PM  For on call review [email or web address].

## 2023-02-03 NOTE — Consult Note (Signed)
NEURO HOSPITALIST CONSULT NOTE   Requestig physician: Dr. Maryjean Ka  Reason for Consult:Seizure versus syncope  History obtained from:  Patient   Chart  Patient and Chart   ***  HPI:                                                                                                                                          Tonya Cortez is an 82 y.o. female with a PMHx of dementia, previous seizure-like activity, arthritis, Bell's palsy, borderline DM, breast cancer s/p left mastectomy, dementia, hypercholesterolemia, memory difficulties, unsteady gait and vertigo who presented to the [facility] ED this afternoon for evaluation of a syncopal versus seizure episode while using the toilet at home. Family said that the patient had screamed, with possible convulsing, and then went unresponsive in less than 30 seconds. Family performed compressions on the patient prior to EMS arrival. The patient denied any chest pain with EMS.   The patient was evaluated in [place] in August for similar episodes. She had not been started on any seizure medicine. She had a CT head and labs that were unremarkable at that time.   Given that this was her second episode of seizure-like activity (first spell 2 months ago), EDP loaded her with 1000 mg IV Keppra.   She continued to be confused at the [facility] ED, suggestive of possible postictal state. She has been transferred to [facility] for Neurology assessment, EEG and MRI.   She is alert and oriented x 1 at baseline. She typically does not talk much. She needs assistance with all activities of daily living and often keeps her eyes shut. She at times will say 1-2 words, but meaningful conversation is not possible.   Additional information regarding the semiology of her spell, documented in Hospitalist H and P, has been reviewed: "Around 2 PM: Patient had been on the commode for 5 minutes, was awake. Normal for her. Patient does keep eyes closed. Niece tried  to lift up patient. Patient seemed to resist. Remained seated for  2 more minutes. All of sudden open eyes looking in corner. Then eyes got big. Started screaming. YEEEE. .. AAAA... and then patient went to convulsion - eyes closed. Shaking all four of her body. Lasted about 30 seconds. Then went limp. Was held on the commode. Then got down on the floor. Stomach was sucked all the way in. Patient did not seem to breath. There was gurgling. Thick saliva. Rolled to side. Performed Cpr. Did mouth to mouth. When chest compression started - her eyes opened. Woke up within 2-3 minutes total."   Past Medical History:  Diagnosis Date   Arthritis    Bell's palsy    Borderline diabetes    Cancer (HCC)    breast  Dementia (HCC)    Diverticulosis    Hypercholesterolemia    Memory difficulties    Unsteady gait    Vertigo     Past Surgical History:  Procedure Laterality Date   ABDOMINAL HYSTERECTOMY     BREAST SURGERY     COLONOSCOPY  10/29/2008   QQV:ZDGLOV examination with some left-sided diverticula/Diminutive cecal polyp, status post cold biopsy removal.  Right colonic mucosa appeared normal. adenomatous   COLONOSCOPY N/A 01/17/2014   normal rectum, scattered left-sided diverticula, 2 diminutive polyps at cecum, tubular adenomas. Surveillance if health permits.    ESOPHAGEAL DILATION  10/16/2019   Procedure: ESOPHAGEAL DILATION;  Surgeon: Corbin Ade, MD;  Location: [facility];  Service: Endoscopy;;   ESOPHAGOGASTRODUODENOSCOPY (EGD) WITH PROPOFOL N/A 10/16/2019   Procedure: ESOPHAGOGASTRODUODENOSCOPY (EGD) WITH PROPOFOL;  Surgeon: Corbin Ade, MD;  Location: [facility];  Service: Endoscopy;  Laterality: N/A;  possible dilation   MASTECTOMY Left    abnormal mass in breast, non-cancerous per patient.    MASTECTOMY      Family History  Problem Relation Age of Onset   Colon cancer Neg Hx               Social History:  reports that she has never smoked. She has never used  smokeless tobacco. She reports that she does not drink alcohol and does not use drugs.  No Known Allergies  HOME MEDICATIONS:                                                                                                                      No current facility-administered medications on file prior to encounter.   Current Outpatient Medications on File Prior to Encounter  Medication Sig Dispense Refill   citalopram (CELEXA) 10 MG/5ML suspension Take 10 mg by mouth daily.     MAPAP ACETAMINOPHEN EXTRA STR 500 MG/15ML LIQD SMARTSIG:15 Milliliter(s) By Mouth Every 6 Hours PRN     Melatonin 1 MG/4ML LIQD Take 15 mLs by mouth at bedtime as needed.     omeprazole (FIRST-OMEPRAZOLE) 2 mg/mL SUSP oral suspension Take 10 mLs (20 mg total) by mouth daily. 300 mL 1   oxyCODONE-acetaminophen (PERCOCET/ROXICET) 5-325 MG tablet Take 1 tablet by mouth every 6 (six) hours as needed for severe pain. 15 tablet 0     ROS:  History obtained from {source of history:310783}  General ROS: negative for - chills, fatigue, fever, night sweats, weight gain or weight loss Psychological ROS: negative for - behavioral disorder, hallucinations, memory difficulties, mood swings or suicidal ideation Ophthalmic ROS: negative for - blurry vision, double vision, eye pain or loss of vision ENT ROS: negative for - epistaxis, nasal discharge, oral lesions, sore throat, tinnitus or vertigo Allergy and Immunology ROS: negative for - hives or itchy/watery eyes Hematological and Lymphatic ROS: negative for - bleeding problems, bruising or swollen lymph nodes Endocrine ROS: negative for - galactorrhea, hair pattern changes, polydipsia/polyuria or temperature intolerance Respiratory ROS: negative for - cough, hemoptysis, shortness of breath or wheezing Cardiovascular ROS: negative for - chest pain,  dyspnea on exertion, edema or irregular heartbeat Gastrointestinal ROS: negative for - abdominal pain, diarrhea, hematemesis, nausea/vomiting or stool incontinence Genito-Urinary ROS: negative for - dysuria, hematuria, incontinence or urinary frequency/urgency Musculoskeletal ROS: negative for - joint swelling or muscular weakness Neurological ROS: as noted in HPI Dermatological ROS: negative for rash and skin lesion changes   Blood pressure 119/66, pulse 89, temperature (!) 97.2 F (36.2 C), temperature source Axillary, resp. rate 20, weight 50.9 kg, SpO2 98%.   General Examination:                                                                                                       Physical Exam  HEENT-  Normocephalic, no lesions, without obvious abnormality.  Normal external eye and conjunctiva.   Cardiovascular- S1-S2 audible, pulses palpable throughout   Lungs-no rhonchi or wheezing noted, no excessive working breathing.  Saturations within normal limits Abdomen- All 4 quadrants palpated and nontender Extremities- Warm, dry and intact Musculoskeletal-no joint tenderness, deformity or swelling Skin-warm and dry, no hyperpigmentation, vitiligo, or suspicious lesions  Neurological Examination Mental Status: Alert, oriented, thought content appropriate.  Speech fluent without evidence of aphasia.  Able to follow 3 step commands without difficulty. Cranial Nerves: II: Discs flat bilaterally; Visual fields grossly normal,  III,IV, VI: ptosis not present, extra-ocular motions intact bilaterally pupils equal, round, reactive to light and accommodation V,VII: smile symmetric, facial light touch sensation normal bilaterally VIII: hearing normal bilaterally IX,X: uvula rises symmetrically XI: bilateral shoulder shrug XII: midline tongue extension Motor: Right : Upper extremity   5/5    Left:     Upper extremity   5/5  Lower extremity   5/5     Lower extremity   5/5 Tone and bulk:normal  tone throughout; no atrophy noted Sensory: Pinprick and light touch intact throughout, bilaterally Deep Tendon Reflexes: 2+ and symmetric throughout Plantars: Right: downgoing   Left: downgoing Cerebellar: normal finger-to-nose, normal rapid alternating movements and normal heel-to-shin test Gait: normal gait and station   Lab Results: Basic Metabolic Panel: Recent Labs  Lab 02/03/23 1633  NA 141  K 4.0  CL 106  CO2 28  GLUCOSE 117*  BUN 11  CREATININE 0.68  CALCIUM 9.0    CBC: Recent Labs  Lab 02/03/23 1633  WBC 6.4  NEUTROABS 4.7  HGB 14.5  HCT 43.9  MCV 91.8  PLT 182    Cardiac Enzymes: No results for input(s): "CKTOTAL", "CKMB", "CKMBINDEX", "TROPONINI" in the last 168 hours.  Lipid Panel: No results for input(s): "CHOL", "TRIG", "HDL", "CHOLHDL", "VLDL", "LDLCALC" in the last 168 hours.  Imaging: DG Chest Port 1 View  Result Date: 02/03/2023 CLINICAL DATA:  Weakness EXAM: PORTABLE CHEST 1 VIEW COMPARISON:  Chest x-ray 08/05/2019 FINDINGS: The heart size and mediastinal contours are within normal limits. Both lungs are clear. There is limited evaluation of the left lung apex secondary to patient's chin position. The visualized skeletal structures are unremarkable. Left axillary surgical clips are present. IMPRESSION: No active disease. Electronically Signed   By: Darliss Cheney M.D.   On: 02/03/2023 20:26     Assessment: 82 year old female presenting with second seizure-like spell in 2 months. She has dementia but no prior history of seizures.  - Exam reveals *** - Semiology of her spell at home is most consistent with a partial onset seizure with secondary generalization. Syncopal convulsion is felt to be unlikely given the semiology of the event as well as continued AMS after waking up, suggestive of postictal state, which does not tend to occur with syncopal convulsions.  -     Recommendations: - EEG (ordered) - She was loaded with 1000 mg IV Keppra at  the [facility] ED - Continue Keppra 500 mg BID - Ativan PRN seizure recurrence.  - MRI brain without contrast is pending.  - Inpatient seizure precautions - ***   Electronically signed: Dr. Caryl Pina 02/03/2023, 11:07 PM

## 2023-02-03 NOTE — ED Notes (Signed)
[facility] has arrived for pt , summary report given to Darrien Paramedic

## 2023-02-03 NOTE — ED Notes (Signed)
.ED TO INPATIENT HANDOFF REPORT  Name/Age/Gender Tonya Cortez 82 y.o. female  Code Status    Code Status Orders  (From admission, onward)           Start     Ordered   02/03/23 1954  Do not attempt resuscitation (DNR) Pre-Arrest Interventions Desired  Continuous       Question Answer Comment  If pulseless and not breathing No CPR or chest compressions.   In Pre-Arrest Conditions (Patient Has Pulse and Is Breathing) May intubate, use advanced airway interventions and cardioversion/ACLS medications if appropriate or indicated. May transfer to ICU.   Consent: Discussion documented in EHR or advanced directives reviewed      02/03/23 1954           Code Status History     Date Active Date Inactive Code Status Order ID Comments User Context   10/16/2019 1232 10/17/2019 1932 DNR [id number]  Cleora Fleet, MD Inpatient   10/16/2019 0523 10/16/2019 1232 Full Code [id number]  Bobette Mo, MD ED       Home/SNF/Other Home  Chief Complaint Seizures [facility]) [R56.9]  Level of Care/Admitting Diagnosis ED Disposition     ED Disposition  Admit   Condition  --   Comment  Hospital Area: [facility] [[id number]]  Level of Care: Progressive [102]  Admit to Progressive based on following criteria: NEUROLOGICAL AND NEUROSURGICAL complex patients with significant risk of instability, who do not meet ICU criteria, yet require close observation or frequent assessment (< / = every 2 - 4 hours) with medical / nursing intervention.  May admit patient to [facility] or [facility] if equivalent level of care is available:: No  Covid Evaluation: Asymptomatic - no recent exposure (last 10 days) testing not required  Diagnosis: Seizures [facility]) [205091]  Admitting Physician: Nolberto Hanlon [[id number]]  Attending Physician: Nolberto Hanlon [[id number]]  Certification:: I certify this patient will need inpatient services for at least 2 midnights  Expected Medical Readiness:  02/06/2023          Medical History Past Medical History:  Diagnosis Date   Arthritis    Bell's palsy    Borderline diabetes    Cancer (HCC)    breast   Dementia (HCC)    Diverticulosis    Hypercholesterolemia    Memory difficulties    Unsteady gait    Vertigo     Allergies No Known Allergies  IV Location/Drains/Wounds Patient Lines/Drains/Airways Status     Active Line/Drains/Airways     Name Placement date Placement time Site Days   Peripheral IV 02/03/23 22 G Anterior;Left Forearm 02/03/23  1637  Forearm  less than 1            Labs/Imaging Results for orders placed or performed during the hospital encounter of 02/03/23 (from the past 48 hour(s))  CBC with Differential     Status: Abnormal   Collection Time: 02/03/23  4:33 PM  Result Value Ref Range   WBC 6.4 4.0 - 10.5 K/uL   RBC 4.78 3.87 - 5.11 MIL/uL   Hemoglobin 14.5 12.0 - 15.0 g/dL   HCT 84.6 96.2 - 95.2 %   MCV 91.8 80.0 - 100.0 fL   MCH 30.3 26.0 - 34.0 pg   MCHC 33.0 30.0 - 36.0 g/dL   RDW 84.1 32.4 - 40.1 %   Platelets 182 150 - 400 K/uL   nRBC 0.0 0.0 - 0.2 %   Neutrophils Relative % 72 %  Neutro Abs 4.7 1.7 - 7.7 K/uL   Lymphocytes Relative 18 %   Lymphs Abs 1.2 0.7 - 4.0 K/uL   Monocytes Relative 6 %   Monocytes Absolute 0.4 0.1 - 1.0 K/uL   Eosinophils Relative 1 %   Eosinophils Absolute 0.0 0.0 - 0.5 K/uL   Basophils Relative 1 %   Basophils Absolute 0.0 0.0 - 0.1 K/uL   Immature Granulocytes 2 %   Abs Immature Granulocytes 0.12 (H) 0.00 - 0.07 K/uL    Comment: Performed at [facility], [address] [place], [place] [address]  Comprehensive metabolic panel     Status: Abnormal   Collection Time: 02/03/23  4:33 PM  Result Value Ref Range   Sodium 141 135 - 145 mmol/L   Potassium 4.0 3.5 - 5.1 mmol/L   Chloride 106 98 - 111 mmol/L   CO2 28 22 - 32 mmol/L   Glucose, Bld 117 (H) 70 - 99 mg/dL    Comment: Glucose reference range applies only to samples  taken after fasting for at least 8 hours.   BUN 11 8 - 23 mg/dL   Creatinine, Ser 2.95 0.44 - 1.00 mg/dL   Calcium 9.0 8.9 - 62.1 mg/dL   Total Protein 6.6 6.5 - 8.1 g/dL   Albumin 3.5 3.5 - 5.0 g/dL   AST 20 15 - 41 U/L   ALT 14 0 - 44 U/L   Alkaline Phosphatase 54 38 - 126 U/L   Total Bilirubin 1.0 0.3 - 1.2 mg/dL   GFR, Estimated >30 >86 mL/min    Comment: (NOTE) Calculated using the CKD-EPI Creatinine Equation (2021)    Anion gap 7 5 - 15    Comment: Performed at [facility], [address] [place], [place] [address]  Troponin I (High Sensitivity)     Status: None   Collection Time: 02/03/23  4:33 PM  Result Value Ref Range   Troponin I (High Sensitivity) 3 <18 ng/L    Comment: (NOTE) Elevated high sensitivity troponin I (hsTnI) values and significant  changes across serial measurements may suggest ACS but many other  chronic and acute conditions are known to elevate hsTnI results.  Refer to the "Links" section for chest pain algorithms and additional  guidance. Performed at [facility], [address], [place], [place] [address]    DG Chest Port 1 View  Result Date: 02/03/2023 CLINICAL DATA:  Weakness EXAM: PORTABLE CHEST 1 VIEW COMPARISON:  Chest x-ray 08/05/2019 FINDINGS: The heart size and mediastinal contours are within normal limits. Both lungs are clear. There is limited evaluation of the left lung apex secondary to patient's chin position. The visualized skeletal structures are unremarkable. Left axillary surgical clips are present. IMPRESSION: No active disease. Electronically Signed   By: Darliss Cheney M.D.   On: 02/03/2023 20:26    Pending Labs Unresulted Labs (From admission, onward)     Start     Ordered   02/10/23 0500  Creatinine, serum  (enoxaparin (LOVENOX)    CrCl >/= 30 ml/min)  Weekly,   R     Comments: while on enoxaparin therapy    02/03/23 1954   02/04/23 0500  APTT  Tomorrow morning,   R        02/03/23  1954   02/04/23 0500  Protime-INR  Tomorrow morning,   R        02/03/23 1954   02/04/23 0500  Basic metabolic panel  Tomorrow morning,  R        02/03/23 1954   02/04/23 0500  CBC  Tomorrow morning,   R        02/03/23 1954   02/03/23 2019  Hemoglobin A1c  Once,   R       Comments: To assess prior glycemic control    02/03/23 2018   02/03/23 2018  Rapid urine drug screen (hospital performed)  ONCE - STAT,   STAT        02/03/23 2017   02/03/23 1954  CBC  (enoxaparin (LOVENOX)    CrCl >/= 30 ml/min)  Once,   R       Comments: Baseline for enoxaparin therapy IF NOT ALREADY DRAWN.  Notify MD if PLT < 100 K.    02/03/23 1954   02/03/23 1954  Creatinine, serum  (enoxaparin (LOVENOX)    CrCl >/= 30 ml/min)  Once,   R       Comments: Baseline for enoxaparin therapy IF NOT ALREADY DRAWN.    02/03/23 1954   02/03/23 1846  Urinalysis, Routine w reflex microscopic -Urine, Clean Catch  Once,   URGENT       Question:  Specimen Source  Answer:  Urine, Clean Catch   02/03/23 1847            Vitals/Pain Today's Vitals   02/03/23 1601 02/03/23 1929  BP: 129/78   Pulse: 90 78  Resp: 15 20  Temp: 97.9 F (36.6 C) 98.6 F (37 C)  TempSrc: Oral Oral  SpO2: 99% 97%    Isolation Precautions No active isolations  Medications Medications  LORazepam (ATIVAN) injection 1 mg (has no administration in time range)  enoxaparin (LOVENOX) injection 40 mg (has no administration in time range)  acetaminophen (TYLENOL) tablet 650 mg (has no administration in time range)    Or  acetaminophen (TYLENOL) suppository 650 mg (has no administration in time range)  polyethylene glycol (MIRALAX / GLYCOLAX) packet 17 g (has no administration in time range)  sodium chloride flush (NS) 0.9 % injection 3 mL (has no administration in time range)  levETIRAcetam (KEPPRA) IVPB 500 mg/100 mL premix (has no administration in time range)  insulin aspart (novoLOG) injection 0-5 Units (has no administration in time  range)  insulin aspart (novoLOG) injection 0-9 Units (has no administration in time range)  levETIRAcetam (KEPPRA) IVPB 1000 mg/100 mL premix (0 mg Intravenous Stopped 02/03/23 1705)  sodium chloride 0.9 % bolus 1,000 mL (0 mLs Intravenous Stopped 02/03/23 1815)  diazepam (VALIUM) injection 5 mg (5 mg Intravenous Given 02/03/23 2013)    Mobility walks with device

## 2023-02-03 NOTE — ED Triage Notes (Signed)
Per EMS, pt is coming from home. Pt had a syncopal or seizure episode while using the toilet. Family said pt screamed, possible convulsing, and then went unresponsive in less than 30 seconds.  Family performed compressions on pt, denies any chest pain with EMS. Hx of dementia, and seizures. A&O 1x at baseline. Pts seizure medications stopped 2x wks ago.

## 2023-02-03 NOTE — ED Notes (Signed)
Pt back from MRI , MRI could not be completed because pt knees wopuld not go flat, MRI tech reports she will message provider to inform him

## 2023-02-03 NOTE — ED Notes (Signed)
Pt bed alarm has been activated

## 2023-02-04 ENCOUNTER — Inpatient Hospital Stay (HOSPITAL_BASED_OUTPATIENT_CLINIC_OR_DEPARTMENT_OTHER)

## 2023-02-04 ENCOUNTER — Inpatient Hospital Stay (HOSPITAL_COMMUNITY): Payer: Self-pay

## 2023-02-04 DIAGNOSIS — R55 Syncope and collapse: Secondary | ICD-10-CM

## 2023-02-04 DIAGNOSIS — R569 Unspecified convulsions: Secondary | ICD-10-CM | POA: Diagnosis not present

## 2023-02-04 LAB — GLUCOSE, CAPILLARY
Glucose-Capillary: 90 mg/dL (ref 70–99)
Glucose-Capillary: 109 mg/dL — ABNORMAL HIGH (ref 70–99)

## 2023-02-04 LAB — BASIC METABOLIC PANEL
Anion gap: 5 (ref 5–15)
BUN: 7 mg/dL — ABNORMAL LOW (ref 8–23)
CO2: 27 mmol/L (ref 22–32)
Calcium: 8.8 mg/dL — ABNORMAL LOW (ref 8.9–10.3)
Chloride: 107 mmol/L (ref 98–111)
Creatinine, Ser: 0.69 mg/dL (ref 0.44–1.00)
GFR, Estimated: >60 mL/min (ref >60)
Glucose, Bld: 78 mg/dL (ref 70–99)
Potassium: 3.5 mmol/L (ref 3.5–5.1)
Sodium: 139 mmol/L (ref 135–145)

## 2023-02-04 LAB — PROTIME-INR
INR: 1 (ref 0.8–1.2)
Prothrombin Time: 13.3 s (ref 11.4–15.2)

## 2023-02-04 LAB — CBC
HCT: 43.4 % (ref 36.0–46.0)
Hemoglobin: 14.3 g/dL (ref 12.0–15.0)
MCH: 29.4 pg (ref 26.0–34.0)
MCHC: 32.9 g/dL (ref 30.0–36.0)
MCV: 89.1 fL (ref 80.0–100.0)
Platelets: 178 10*3/uL (ref 150–400)
RBC: 4.87 MIL/uL (ref 3.87–5.11)
RDW: 13.6 % (ref 11.5–15.5)
WBC: 7.3 10*3/uL (ref 4.0–10.5)
nRBC: 0 % (ref 0.0–0.2)

## 2023-02-04 LAB — ECHOCARDIOGRAM COMPLETE
AR max vel: 1.64 cm2
AV Area VTI: 1.66 cm2
AV Area mean vel: 1.55 cm2
AV Mean grad: 2 mm[Hg]
AV Peak grad: 4.2 mm[Hg]
Ao pk vel: 1.03 m/s
Area-P 1/2: 3.42 cm2
Height: 61 in
S' Lateral: 2.7 cm
Weight: 1795.43 [oz_av]

## 2023-02-04 LAB — HEMOGLOBIN A1C
Hgb A1c MFr Bld: 5.2 % (ref 4.8–5.6)
Mean Plasma Glucose: 102.54 mg/dL

## 2023-02-04 LAB — APTT: aPTT: 30 s (ref 24–36)

## 2023-02-04 MED ORDER — ORAL CARE MOUTH RINSE: 15.0000 mL | OROMUCOSAL | Status: DC | PRN

## 2023-02-04 MED ORDER — LEVETIRACETAM 250 MG PO TABS: 250.0000 mg | ORAL_TABLET | Freq: Two times a day (BID) | ORAL | 2 refills | Status: DC

## 2023-02-04 MED ORDER — LEVETIRACETAM 250 MG PO TABS
250.0000 mg | ORAL_TABLET | Freq: Two times a day (BID) | ORAL | Status: DC
  Administered 2023-02-04: 250 mg via ORAL
  Filled 2023-02-04: qty 1

## 2023-02-04 NOTE — Progress Notes (Signed)
Echocardiogram 2D Echocardiogram has been performed.  Tonya Cortez Tonya Cortez 02/04/2023, 2:39 PM

## 2023-02-04 NOTE — Plan of Care (Signed)
Patient is discharging home with hospice care and family.

## 2023-02-04 NOTE — Progress Notes (Signed)
EEG complete - results pending 

## 2023-02-04 NOTE — Progress Notes (Signed)
PT sleeping as per RN. Eeg to be conducted in am

## 2023-02-04 NOTE — Discharge Summary (Signed)
PATIENT DETAILS Name: Tonya Cortez Age: 82 y.o. Sex: female Date of Birth: 07-11-1940 MRN: [id number]. Admitting Physician: Nolberto Hanlon, MD IRC:[email or web address], Carlyon Shadow., NP  Admit Date: 02/03/2023 Discharge date: 02/04/2023  Recommendations for Outpatient Follow-up:  Follow up with PCP in 1-2 weeks Please obtain CMP/CBC in one week Please ensure follow-up with neurology   Admitted From:  Home  Disposition: Home   Discharge Condition: good  CODE STATUS:   Code Status: Do not attempt resuscitation (DNR) PRE-ARREST INTERVENTIONS DESIRED   Diet recommendation:  Diet Order             DIET DYS 3 Room service appropriate? Yes; Fluid consistency: Thin  Diet effective now           Diet general                    Brief Summary: 82 year old with history of dementia-prior seizure-like activity-presented with seizure-like activity-admitted to the hospitalist service for further evaluation.  Brief Hospital Course: Seizures Admitted and underwent evaluation including CT head/EEG-both are stable Neurology recommending Keppra to 50 mg twice daily Ambulatory referral to neurology placed Family aware of seizure precautions-however patient does not drive.  Dementia At risk of delirium Supportive care Follow with outpatient PCP/neurology  Discussed with family at bedside-patient at usual baseline.  Rest of patient's medical problems were stable during the short overnight hospitalization.  Discharge Diagnoses:  Principal Problem:   Seizures [facility])   Discharge Instructions:  Activity:  As tolerated   Discharge Instructions     Ambulatory referral to Neurology   Complete by: As directed    An appointment is requested in approximately: 4 weeks - 8 weeks   Call MD for:   Complete by: As directed    Recurrent breakthrough seizures.   Diet general   Complete by: As directed    Discharge instructions   Complete by: As directed    Follow with Primary MD   Mariel Kansky., NP in 1-2 weeks  Please get a complete blood count and chemistry panel checked by your Primary MD at your next visit, and again as instructed by your Primary MD.  Get Medicines reviewed and adjusted: Please take all your medications with you for your next visit with your Primary MD  Laboratory/radiological data: Please request your Primary MD to go over all hospital tests and procedure/radiological results at the follow up, please ask your Primary MD to get all Hospital records sent to his/her office.  In some cases, they will be blood work, cultures and biopsy results pending at the time of your discharge. Please request that your primary care M.D. follows up on these results.  Also Note the following: If you experience worsening of your admission symptoms, develop shortness of breath, life threatening emergency, suicidal or homicidal thoughts you must seek medical attention immediately by calling 911 or calling your MD immediately  if symptoms less severe.  You must read complete instructions/literature along with all the possible adverse reactions/side effects for all the Medicines you take and that have been prescribed to you. Take any new Medicines after you have completely understood and accpet all the possible adverse reactions/side effects.   Do not drive when taking Pain medications or sleeping medications (Benzodaizepines)  Do not take more than prescribed Pain, Sleep and Anxiety Medications. It is not advisable to combine anxiety,sleep and pain medications without talking with your primary care practitioner  Special Instructions: If you have smoked or chewed Tobacco  in the last 2 yrs please stop smoking, stop any regular Alcohol  and or any Recreational drug use.  Wear Seat belts while driving.  Please note: You were cared for by a hospitalist during your hospital stay. Once you are discharged, your primary care physician will handle any further medical  issues. Please note that NO REFILLS for any discharge medications will be authorized once you are discharged, as it is imperative that you return to your primary care physician (or establish a relationship with a primary care physician if you do not have one) for your post hospital discharge needs so that they can reassess your need for medications and monitor your lab values.     Seizure precautions: Per [facility] statutes, patients with seizures are not allowed to drive until they have been seizure-free for six months and cleared by a physician    Use caution when using heavy equipment or power tools. Avoid working on ladders or at heights. Take showers instead of baths. Ensure the water temperature is not too high on the home water heater. Do not go swimming alone. Do not lock yourself in a room alone (i.e. bathroom). When caring for infants or small children, sit down when holding, feeding, or changing them to minimize risk of injury to the child in the event you have a seizure. Maintain good sleep hygiene. Avoid alcohol.    If patient has another seizure, call 911 and bring them back to the ED if: A.  The seizure lasts longer than 5 minutes.      B.  The patient doesn't wake shortly after the seizure or has new problems such as difficulty seeing, speaking or moving following the seizure C.  The patient was injured during the seizure D.  The patient has a temperature over 102 F (39C) E.  The patient vomited during the seizure and now is having trouble breathing    During the Seizure   - First, ensure adequate ventilation and place patients on the floor on their left side  Loosen clothing around the neck and ensure the airway is patent. If the patient is clenching the teeth, do not force the mouth open with any object as this can cause severe damage - Remove all items from the surrounding that can be hazardous. The patient may be oblivious to what's happening and may not even know what  he or she is doing. If the patient is confused and wandering, either gently guide him/her away and block access to outside areas - Reassure the individual and be comforting - Call 911. In most cases, the seizure ends before EMS arrives. However, there are cases when seizures may last over 3 to 5 minutes. Or the individual may have developed breathing difficulties or severe injuries. If a pregnant patient or a person with diabetes develops a seizure, it is prudent to call an ambulance. - Finally, if the patient does not regain full consciousness, then call EMS. Most patients will remain confused for about 45 to 90 minutes after a seizure, so you must use judgment in calling for help. - Avoid restraints but make sure the patient is in a bed with padded side rails - Place the individual in a lateral position with the neck slightly flexed; this will help the saliva drain from the mouth and prevent the tongue from falling backward - Remove all nearby furniture and other hazards from the area - Provide verbal assurance as the individual is regaining consciousness - Provide the  patient with privacy if possible - Call for help and start treatment as ordered by the caregiver    After the Seizure (Postictal Stage)   After a seizure, most patients experience confusion, fatigue, muscle pain and/or a headache. Thus, one should permit the individual to sleep. For the next few days, reassurance is essential. Being calm and helping reorient the person is also of importance.   Most seizures are painless and end spontaneously. Seizures are not harmful to others but can lead to complications such as stress on the lungs, brain and the heart. Individuals with prior lung problems may develop labored breathing and respiratory distress.    Increase activity slowly   Complete by: As directed       Allergies as of 02-17-2023   No Known Allergies      Medication List     TAKE these medications    levETIRAcetam  250 MG tablet Commonly known as: KEPPRA Take 1 tablet (250 mg total) by mouth 2 (two) times daily.   nystatin cream Commonly known as: MYCOSTATIN Apply topically 3 (three) times daily.        Follow-up Information     Presnell, Carlyon Shadow., NP. Schedule an appointment as soon as possible for a visit in 1 week(s).   Specialty: Adult Health Nurse Practitioner Contact information: [address] [place] [address] [phone]         Windell Norfolk, MD Follow up.   Specialty: Neurology Why: Office will call with date/time, If you dont hear from them,please give them a call Contact information: [address] [place] [address] [phone]                No Known Allergies   Other Procedures/Studies: EEG adult  Result Date: 2023-02-17 Charlsie Quest, MD     02-17-2023 11:18 AM Patient Name: SHANY CABACUNGAN MRN: [id number] Epilepsy Attending: Charlsie Quest Referring Physician/Provider: Charlynne Pander, MD Date:  02/17/23 Duration: 26.14 mins Patient history: 82 year old female presenting with second seizure-like spell in 2 months. She has dementia but no prior history of seizures. EEG to evaluate for seizure Level of alertness: Awake AEDs during EEG study: LEV Technical aspects: This EEG study was done with scalp electrodes positioned according to the 10-20 International system of electrode placement. Electrical activity was reviewed with band pass filter of 1-70Hz , sensitivity of 7 uV/mm, display speed of 37mm/sec with a 60Hz  notched filter applied as appropriate. EEG data were recorded continuously and digitally stored.  Video monitoring was available and reviewed as appropriate. Description: The posterior dominant rhythm consists of 7 Hz activity of moderate voltage (25-35 uV) seen predominantly in posterior head regions, symmetric and reactive to eye opening and eye closing. EEG showed continuous generalized 5 to 7 Hz theta slowing admixed with  intermittent generalized 2-3Hz  delta slowing. Physiologic photic driving was not seen during photic stimulation.  Hyperventilation was not performed.   ABNORMALITY - Continuous slow, generalized IMPRESSION: This study is suggestive of moderate diffuse encephalopathy. No seizures or epileptiform discharges were seen throughout the recording. Charlsie Quest   DG Chest Port 1 View  Result Date: 02/03/2023 CLINICAL DATA:  Weakness EXAM: PORTABLE CHEST 1 VIEW COMPARISON:  Chest x-ray 08/05/2019 FINDINGS: The heart size and mediastinal contours are within normal limits. Both lungs are clear. There is limited evaluation of the left lung apex secondary to patient's chin position. The visualized skeletal structures are unremarkable. Left axillary surgical clips are present. IMPRESSION:  No active disease. Electronically Signed   By: Darliss Cheney M.D.   On: 02/03/2023 20:26     TODAY-DAY OF DISCHARGE:  Subjective:   Ephraim Hamburger today has no headache,no chest abdominal pain,no new weakness tingling or numbness, feels much better wants to go home today.   Objective:   Blood pressure 116/60, pulse 61, temperature (!) 97.2 F (36.2 C), temperature source Axillary, resp. rate 14, height 5\' 1"  (1.549 m), weight 50.9 kg, SpO2 94%.  Intake/Output Summary (Last 24 hours) at 02/04/2023 1252 Last data filed at 02/04/2023 0806 Gross per 24 hour  Intake 120 ml  Output --  Net 120 ml   Filed Weights   02/03/23 2233  Weight: 50.9 kg    Exam: Awake Alert, Oriented *3, No new F.N deficits, Normal affect [place].AT,PERRAL Supple Neck,No JVD, No cervical lymphadenopathy appriciated.  Symmetrical Chest wall movement, Good air movement bilaterally, CTAB RRR,No Gallops,Rubs or new Murmurs, No Parasternal Heave +ve B.Sounds, Abd Soft, Non tender, No organomegaly appriciated, No rebound -guarding or rigidity. No Cyanosis, Clubbing or edema, No new Rash or bruise   PERTINENT RADIOLOGIC STUDIES: EEG  adult  Result Date: 02/04/2023 Charlsie Quest, MD     02/04/2023 11:18 AM Patient Name: LALONI KASPRZYK MRN: [id number] Epilepsy Attending: Charlsie Quest Referring Physician/Provider: Charlynne Pander, MD Date:  02/04/2023 Duration: 26.14 mins Patient history: 82 year old female presenting with second seizure-like spell in 2 months. She has dementia but no prior history of seizures. EEG to evaluate for seizure Level of alertness: Awake AEDs during EEG study: LEV Technical aspects: This EEG study was done with scalp electrodes positioned according to the 10-20 International system of electrode placement. Electrical activity was reviewed with band pass filter of 1-70Hz , sensitivity of 7 uV/mm, display speed of 22mm/sec with a 60Hz  notched filter applied as appropriate. EEG data were recorded continuously and digitally stored.  Video monitoring was available and reviewed as appropriate. Description: The posterior dominant rhythm consists of 7 Hz activity of moderate voltage (25-35 uV) seen predominantly in posterior head regions, symmetric and reactive to eye opening and eye closing. EEG showed continuous generalized 5 to 7 Hz theta slowing admixed with intermittent generalized 2-3Hz  delta slowing. Physiologic photic driving was not seen during photic stimulation.  Hyperventilation was not performed.   ABNORMALITY - Continuous slow, generalized IMPRESSION: This study is suggestive of moderate diffuse encephalopathy. No seizures or epileptiform discharges were seen throughout the recording. Charlsie Quest   DG Chest Port 1 View  Result Date: 02/03/2023 CLINICAL DATA:  Weakness EXAM: PORTABLE CHEST 1 VIEW COMPARISON:  Chest x-ray 08/05/2019 FINDINGS: The heart size and mediastinal contours are within normal limits. Both lungs are clear. There is limited evaluation of the left lung apex secondary to patient's chin position. The visualized skeletal structures are unremarkable. Left axillary surgical  clips are present. IMPRESSION: No active disease. Electronically Signed   By: Darliss Cheney M.D.   On: 02/03/2023 20:26     PERTINENT LAB RESULTS: CBC: Recent Labs    02/03/23 1633 02/04/23 0542  WBC 6.4 7.3  HGB 14.5 14.3  HCT 43.9 43.4  PLT 182 178   CMET CMP     Component Value Date/Time   NA 139 02/04/2023 0542   K 3.5 02/04/2023 0542   CL 107 02/04/2023 0542   CO2 27 02/04/2023 0542   GLUCOSE 78 02/04/2023 0542   BUN 7 (L) 02/04/2023 0542   CREATININE 0.69 02/04/2023 0542   CALCIUM  8.8 (L) 02/04/2023 0542   PROT 6.6 02/03/2023 1633   ALBUMIN 3.5 02/03/2023 1633   AST 20 02/03/2023 1633   ALT 14 02/03/2023 1633   ALKPHOS 54 02/03/2023 1633   BILITOT 1.0 02/03/2023 1633   GFRNONAA >60 02/04/2023 0542    GFR Estimated Creatinine Clearance: 40.9 mL/min (by C-G formula based on SCr of 0.69 mg/dL). No results for input(s): "LIPASE", "AMYLASE" in the last 72 hours. No results for input(s): "CKTOTAL", "CKMB", "CKMBINDEX", "TROPONINI" in the last 72 hours. Invalid input(s): "POCBNP" No results for input(s): "DDIMER" in the last 72 hours. Recent Labs    02/04/23 0542  HGBA1C 5.2   No results for input(s): "CHOL", "HDL", "LDLCALC", "TRIG", "CHOLHDL", "LDLDIRECT" in the last 72 hours. No results for input(s): "TSH", "T4TOTAL", "T3FREE", "THYROIDAB" in the last 72 hours.  Invalid input(s): "FREET3" No results for input(s): "VITAMINB12", "FOLATE", "FERRITIN", "TIBC", "IRON", "RETICCTPCT" in the last 72 hours. Coags: Recent Labs    02/04/23 0542  INR 1.0   Microbiology: No results found for this or any previous visit (from the past 240 hour(s)).  FURTHER DISCHARGE INSTRUCTIONS:  Get Medicines reviewed and adjusted: Please take all your medications with you for your next visit with your Primary MD  Laboratory/radiological data: Please request your Primary MD to go over all hospital tests and procedure/radiological results at the follow up, please ask your  Primary MD to get all Hospital records sent to his/her office.  In some cases, they will be blood work, cultures and biopsy results pending at the time of your discharge. Please request that your primary care M.D. goes through all the records of your hospital data and follows up on these results.  Also Note the following: If you experience worsening of your admission symptoms, develop shortness of breath, life threatening emergency, suicidal or homicidal thoughts you must seek medical attention immediately by calling 911 or calling your MD immediately  if symptoms less severe.  You must read complete instructions/literature along with all the possible adverse reactions/side effects for all the Medicines you take and that have been prescribed to you. Take any new Medicines after you have completely understood and accpet all the possible adverse reactions/side effects.   Do not drive when taking Pain medications or sleeping medications (Benzodaizepines)  Do not take more than prescribed Pain, Sleep and Anxiety Medications. It is not advisable to combine anxiety,sleep and pain medications without talking with your primary care practitioner  Special Instructions: If you have smoked or chewed Tobacco  in the last 2 yrs please stop smoking, stop any regular Alcohol  and or any Recreational drug use.  Wear Seat belts while driving.  Please note: You were cared for by a hospitalist during your hospital stay. Once you are discharged, your primary care physician will handle any further medical issues. Please note that NO REFILLS for any discharge medications will be authorized once you are discharged, as it is imperative that you return to your primary care physician (or establish a relationship with a primary care physician if you do not have one) for your post hospital discharge needs so that they can reassess your need for medications and monitor your lab values.  Total Time spent coordinating discharge  including counseling, education and face to face time equals greater than 30 minutes.  SignedJeoffrey Massed 02/04/2023 12:52 PM

## 2023-02-04 NOTE — Plan of Care (Signed)
Pt has rested quietly throughout the night with no distress noted. Will not open eyes. Does not answer questions. Does say one or two words at times, hard to understand. Follows some simple commands. On room air. SR on the monitor. Purwick intact to suction. Was incontinent of urine twice. Had large brown BM. Daughter at bedside tonight. No complaints voiced.     Problem: Education: Goal: Individualized Educational Video(s) Outcome: Progressing   Problem: Coping: Goal: Ability to adjust to condition or change in health will improve Outcome: Progressing   Problem: Metabolic: Goal: Ability to maintain appropriate glucose levels will improve Outcome: Progressing   Problem: Nutritional: Goal: Maintenance of adequate nutrition will improve Outcome: Progressing Goal: Progress toward achieving an optimal weight will improve Outcome: Progressing   Problem: Tissue Perfusion: Goal: Adequacy of tissue perfusion will improve Outcome: Progressing   Problem: Education: Goal: Knowledge of General Education information will improve Description: Including pain rating scale, medication(s)/side effects and non-pharmacologic comfort measures Outcome: Progressing   Problem: Health Behavior/Discharge Planning: Goal: Ability to manage health-related needs will improve Outcome: Progressing   Problem: Clinical Measurements: Goal: Ability to maintain clinical measurements within normal limits will improve Outcome: Progressing Goal: Will remain free from infection Outcome: Progressing Goal: Diagnostic test results will improve Outcome: Progressing   Problem: Activity: Goal: Risk for activity intolerance will decrease Outcome: Progressing   Problem: Nutrition: Goal: Adequate nutrition will be maintained Outcome: Progressing   Problem: Pain Management: Goal: General experience of comfort will improve Outcome: Progressing   Problem: Safety: Goal: Ability to remain free from injury will  improve Outcome: Progressing

## 2023-02-04 NOTE — TOC Transition Note (Signed)
Transition of Care [facility]) - CM/SW Discharge Note   Patient Details  Name: Tonya Cortez MRN: [id number] Date of Birth: [birth year]  [facility] CM/SW Contact:  Gordy Clement, RN Phone Number: 02/04/2023, 1:32 PM   Clinical Narrative:     Patient to dc to home today  Daughters will transport. Patient has all needed equipment in the home  No recs for PT or OT. Patient will follow up as directed on AVS    No additional TOC needs           Patient Goals and CMS Choice      Discharge Placement                         Discharge Plan and Services Additional resources added to the After Visit Summary for                                       Social Determinants of Health (SDOH) Interventions SDOH Screenings   Food Insecurity: No Food Insecurity (02/03/2023)  Housing: Low Risk  (02/03/2023)  Transportation Needs: No Transportation Needs (02/03/2023)  Utilities: Not At Risk (02/03/2023)  Social Connections: Unknown (11/08/2022)   Received from [facility]  Tobacco Use: Low Risk  (02/03/2023)     Readmission Risk Interventions     No data to display

## 2023-02-04 NOTE — Progress Notes (Addendum)
NEUROLOGY CONSULT FOLLOW UP NOTE   Date of service: February 04, 2023 Patient Name: Tonya Cortez MRN:  [id number] DOB:  1940/12/06  Brief HPI  Tonya LUIS ZEPF is an 82 y.o. female with a PMHx of dementia, previous seizure-like activity, arthritis, Bell's palsy, borderline DM, breast cancer s/p left mastectomy, dementia, hypercholesterolemia, memory difficulties, unsteady gait and vertigo who presented to the [facility] ED this afternoon for evaluation of a syncopal versus seizure episode while using the toilet at home. Family said that the patient had screamed, with possible convulsing, and then went unresponsive in less than 30 seconds. Family performed compressions on the patient prior to EMS arrival.    She is alert and oriented x 1 at baseline. She typically does not talk much. She needs assistance with all activities of daily living. She can walk on her own. She at times will say 1-2 words, but meaningful conversation is not possible.   Has had reduced appetite and losing weight over the last few months   Interval Hx/subjective   - Keeping her eyes closed more than normal, otherwise at baseline per family at bedside  Vitals   Vitals:   02/03/23 2145 02/03/23 2233 02/03/23 2325 02/04/23 0336  BP:  126/73 133/80 112/74  Pulse: 89 65 62 (!) 58  Resp: 20 20 16 16   Temp:  (!) 97.2 F (36.2 C) (!) 97.2 F (36.2 C) (!) 97.5 F (36.4 C)  TempSrc:  Axillary Axillary Axillary  SpO2: 98% 98% 98% 96%  Weight:  50.9 kg    Height:  5\' 1"  (1.549 m)       Body mass index is 21.2 kg/m.  Physical Exam   Constitutional: Appears frail Psych: Irritatable Eyes: Limited eval, resists eye opening Head: EEG in place  Cardiovascular: Perfusing extremities well  Respiratory: Effort normal, non-labored breathing.  GI: Soft.  No distension. There is no tenderness.    Neurologic Examination   Moving all extremities equally, restless in the bed. Moves eyes bilaterally, cannot examine pupils  due to patient resisting, equal blink to eyelash brush, face symmetric, tongue midline. Brings bottle to mouth readily when it is handed to her by daughter, takes a bite of hasbrown and swallows well. Brief phrases such as "stop" -- does not follow commands. Speech unintelligible at times (baseline per family)   Labs and Diagnostic Imaging   CBC:  Recent Labs  Lab 02/03/23 1633 02/04/23 0542  WBC 6.4 7.3  NEUTROABS 4.7  --   HGB 14.5 14.3  HCT 43.9 43.4  MCV 91.8 89.1  PLT 182 178    Basic Metabolic Panel:  Lab Results  Component Value Date   NA 139 02/04/2023   K 3.5 02/04/2023   CO2 27 02/04/2023   GLUCOSE 78 02/04/2023   BUN 7 (L) 02/04/2023   CREATININE 0.69 02/04/2023   CALCIUM 8.8 (L) 02/04/2023   GFRNONAA >60 02/04/2023   GFRAA >60 10/17/2019   HgbA1c:  Lab Results  Component Value Date   HGBA1C 5.2 02/04/2023    INR  Lab Results  Component Value Date   INR 1.0 02/04/2023   APTT  Lab Results  Component Value Date   APTT 30 02/04/2023   MRI Brain: Unable to obtain due to dementia / inability to lay still   rEEG: In process  Impression   Tonya Cortez is a 82 y.o. 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   CrCl 30 to <50 mL/minute/1.73 m2: 250 to 750 mg every 12 hours. - Head CT to rule out acute intracranial process if patient will tolerate, if cannot be completed, re-consider imaging outpatient - Palliative care referral outpatient at family request  - Amb ref to neurology (Dr. Teresa Coombs at [facility]) placed - Seizure precautions reviewed, patient does not drive  - Discussed with Dr. Jerral Ralph in person  Addendum, EEG negative, head CT negative on my personal review.  Radiology report pending but preliminary read negative on discussion  with Dr. Cyndy Freeze ______________________________________________________________________   Thank you for the opportunity to take part in the care of this patient. If you have any further questions, please contact the neurology consultation team on call. Updated oncall schedule is listed on AMION.  Nunzio Cory MD-PhD [facility] [phone]

## 2023-02-04 NOTE — Care Management Obs Status (Signed)
MEDICARE OBSERVATION STATUS NOTIFICATION   Patient Details  Name: Tonya Cortez MRN: [id number] Date of Birth: [birth year]   Medicare Observation Status Notification Given:  Yes    Gordy Clement, RN 02/04/2023, 1:14 PM

## 2023-02-04 NOTE — Care Management CC44 (Signed)
Condition Code 44 Documentation Completed  Patient Details  Name: TRELLIS BROUSSARD MRN: [id number] Date of Birth: [birth year]   Condition Code 44 given:  Yes Patient signature on Condition Code 44 notice:  Yes Documentation of 2 MD's agreement:  Yes Code 44 added to claim:  Yes    Gordy Clement, RN 02/04/2023, 1:14 PM

## 2023-02-04 NOTE — Evaluation (Signed)
Physical Therapy Evaluation Patient Details Name: Tonya Cortez MRN: [id number] DOB: [birth year] Today's Date: 02/04/2023  History of Present Illness  Pt is 82 yo presenting with syncopal versus seizure episode while using the toilet at home. EEG was negative with possible encephalopathy. PMH: Dementia, previous seizure-like activity, arthritis, Bell's Palsy, borderline DM, breast cancer s/p L mastectomy, hypercholesterolemia, memory difficulties, unsteady gait and vertigo.  Clinical Impression  Pt is presenting slightly below baseline level of functioning with 2 person assist for sit to stand and gait. Pt daughters are present and are her caregivers they state she is close to baseline and they do not feel she needs any physical therapy. Due to pt current cognitive level and PLOF no current recommendations for skilled physical therapy services. Pt has 24/7 assistance at home. Pt will be discharged from acute care skilled physical therapy services at this time; please re-consult if further needs arise.         If plan is discharge home, recommend the following: A lot of help with walking and/or transfers;Assistance with cooking/housework;Assist for transportation;Help with stairs or ramp for entrance     Equipment Recommendations None recommended by PT     Functional Status Assessment Patient has not had a recent decline in their functional status     Precautions / Restrictions Precautions Precautions: Fall Restrictions Weight Bearing Restrictions: No      Mobility  Bed Mobility Overal bed mobility: Needs Assistance Bed Mobility: Supine to Sit, Sit to Supine     Supine to sit: Mod assist Sit to supine: Mod assist   General bed mobility comments: For trunk to get to sitting and LE to get to supine. Daughters state they help her at home.    Transfers Overall transfer level: Needs assistance Equipment used: 2 person hand held assist Transfers: Sit to/from Stand Sit to  Stand: Min assist           General transfer comment: Min A for steady due to posterior COM over BOS 3x during session 2x from EOB and 1x from toilet.    Ambulation/Gait Ambulation/Gait assistance: Min assist Gait Distance (Feet): 15 Feet (2x) Assistive device: 2 person hand held assist Gait Pattern/deviations: Step-through pattern, Decreased stride length, Narrow base of support, Drifts right/left Gait velocity: decreased Gait velocity interpretation: <1.31 ft/sec, indicative of household ambulator   General Gait Details: posterior COM over BOS keeps eyes closed most of the time.       Balance Overall balance assessment: Needs assistance Sitting-balance support: No upper extremity supported, Feet supported Sitting balance-Leahy Scale: Fair Sitting balance - Comments: SBA for safety   Standing balance support: Bilateral upper extremity supported, During functional activity Standing balance-Leahy Scale: Poor Standing balance comment: reliant on external support         Pertinent Vitals/Pain Pain Assessment Pain Assessment: Faces Faces Pain Scale: Hurts a little bit Breathing: normal Negative Vocalization: occasional moan/groan, low speech, negative/disapproving quality Facial Expression: smiling or inexpressive Body Language: relaxed Consolability: no need to console PAINAD Score: 1 Pain Location: unclear Pain Descriptors / Indicators: Grimacing Pain Intervention(s): Monitored during session    Home Living Family/patient expects to be discharged to:: Private residence Living Arrangements: Other (Comment) (Pt has caregivers 24/7)   Type of Home: House Home Access: Ramped entrance       Home Layout: One level Home Equipment: Pharmacist, hospital (2 wheels);Cane - single point;Transport chair;Grab bars - tub/shower;BSC/3in1;Hospital bed;Lift chair Additional Comments: pts daughters assisted with history    Prior Function Prior  Level of Function : Needs  assist  Cognitive Assist : Mobility (cognitive) Mobility (Cognitive): Set up cues   Physical Assist : ADLs (physical)   ADLs (physical): Feeding;Grooming;Bathing;Dressing;Toileting;IADLs Mobility Comments: Supervision ADLs Comments: Total A     Extremity/Trunk Assessment   Upper Extremity Assessment Upper Extremity Assessment: Generalized weakness    Lower Extremity Assessment Lower Extremity Assessment: Generalized weakness    Cervical / Trunk Assessment Cervical / Trunk Assessment: Kyphotic  Communication   Communication Communication: Other (comment) (pt has nonsensical speech and does not follow directions) Cueing Techniques: Verbal cues;Tactile cues  Cognition Arousal: Lethargic Behavior During Therapy: Flat affect Overall Cognitive Status: History of cognitive impairments - at baseline     General Comments: pt mostly kept eyes closed        General Comments General comments (skin integrity, edema, etc.): Pt daughters present throughout session and hospice nurse. Pt is at baseline per family. Pt was in a pool of urine upon entering room. Brief was donned and completely saturated. Family stated they had asked for assistance a while ago. Pt and family were assisted in cleaning up urine, taking pt to rest room, cleaning urine out of the bed and changing linens/gown/socks; RN was notified.        Assessment/Plan    PT Assessment Patient does not need any further PT services         PT Goals (Current goals can be found in the Care Plan section)  Acute Rehab PT Goals PT Goal Formulation: All assessment and education complete, DC therapy             AM-PAC PT "6 Clicks" Mobility  Outcome Measure Help needed turning from your back to your side while in a flat bed without using bedrails?: A Little Help needed moving from lying on your back to sitting on the side of a flat bed without using bedrails?: A Little Help needed moving to and from a bed to a chair  (including a wheelchair)?: A Lot Help needed standing up from a chair using your arms (e.g., wheelchair or bedside chair)?: A Lot Help needed to walk in hospital room?: A Lot Help needed climbing 3-5 steps with a railing? : A Lot 6 Click Score: 14    End of Session Equipment Utilized During Treatment: Gait belt Activity Tolerance: Other (comment) (pt limited by mentation) Patient left: in bed;with call bell/phone within reach;with family/visitor present;with bed alarm set Nurse Communication: Mobility status;Other (comment) (pt was completely saturated in urine)      Time: 5784-6962 PT Time Calculation (min) (ACUTE ONLY): 38 min   Charges:   PT Evaluation $PT Eval Low Complexity: 1 Low PT Treatments $Therapeutic Activity: 23-37 mins PT General Charges $$ ACUTE PT VISIT: 1 Visit         Harrel Carina, DPT, CLT  Acute Rehabilitation Services Office: [phone] (Secure chat preferred)   Claudia Desanctis 02/04/2023, 1:38 PM

## 2023-02-04 NOTE — Procedures (Signed)
Patient Name: Tonya Cortez  MRN: [id number]  Epilepsy Attending: Charlsie Quest  Referring Physician/Provider: Charlynne Pander, MD  Date:  02/04/2023  Duration: 26.14 mins  Patient history: 82 year old female presenting with second seizure-like spell in 2 months. She has dementia but no prior history of seizures. EEG to evaluate for seizure  Level of alertness: Awake  AEDs during EEG study: LEV  Technical aspects: This EEG study was done with scalp electrodes positioned according to the 10-20 International system of electrode placement. Electrical activity was reviewed with band pass filter of 1-70Hz , sensitivity of 7 uV/mm, display speed of 34mm/sec with a 60Hz  notched filter applied as appropriate. EEG data were recorded continuously and digitally stored.  Video monitoring was available and reviewed as appropriate.  Description: The posterior dominant rhythm consists of 7 Hz activity of moderate voltage (25-35 uV) seen predominantly in posterior head regions, symmetric and reactive to eye opening and eye closing. EEG showed continuous generalized 5 to 7 Hz theta slowing admixed with intermittent generalized 2-3Hz  delta slowing. Physiologic photic driving was not seen during photic stimulation.  Hyperventilation was not performed.     ABNORMALITY - Continuous slow, generalized  IMPRESSION: This study is suggestive of moderate diffuse encephalopathy. No seizures or epileptiform discharges were seen throughout the recording.  Anjeanette Petzold Annabelle Harman

## 2023-02-04 NOTE — Progress Notes (Signed)
Patient to discharge home. Family with patient. Patient dressed by Huston Foley per family request. Patient to discharge with family.

## 2023-03-11 ENCOUNTER — Encounter: Payer: Self-pay | Admitting: Neurology

## 2023-03-11 ENCOUNTER — Ambulatory Visit (INDEPENDENT_AMBULATORY_CARE_PROVIDER_SITE_OTHER): Payer: Medicare HMO | Admitting: Neurology

## 2023-03-11 VITALS — BP 169/84 | HR 65 | Resp 16 | Ht 61.0 in

## 2023-03-11 DIAGNOSIS — F03C0 Unspecified dementia, severe, without behavioral disturbance, psychotic disturbance, mood disturbance, and anxiety: Secondary | ICD-10-CM

## 2023-03-11 DIAGNOSIS — R569 Unspecified convulsions: Secondary | ICD-10-CM

## 2023-03-11 MED ORDER — LEVETIRACETAM 100 MG/ML PO SOLN: 250.0000 mg | Freq: Every day | ORAL | 12 refills | Status: AC

## 2023-03-11 NOTE — Progress Notes (Signed)
[facility]  PATIENT: Tonya Cortez DOB: 1940/10/30  REQUESTING CLINICIAN: Bhagat, Karmen Bongo, MD HISTORY FROM: Daughter REASON FOR VISIT: Seizure    HISTORICAL  CHIEF COMPLAINT:  Chief Complaint  Patient presents with   Seizures    Rm12, daughter present, ZO:XWRU episode 02/03/23, reported keppra went to 1 tab daily as the bid made pt to sleepy     HISTORY OF PRESENT ILLNESS:  This 82 year old woman with past medical history of severe dementia who is presenting after 2 seizures.  History obtained from daughter.  Daughter tells me the first seizure occurred in August when patient was noted to have an episode of unresponsiveness, loss of consciousness lasted about 2 minutes.  EMS was called and patient taken to the hospital. The second event occurred on October 30, while patient was in the bathroom, family heard her scream and found her on the floor shaking and unresponsive.  CPR was done.  In the hospital she did have a head CT did not show any acute abnormality, EEG showed diffuse slowing.  She was started on Keppra 250 mg twice daily.  Daughter tells me that the Keppra made her very sleepy, currently she is only giving 250 nightly but is difficult as they have to crush the medication   OTHER MEDICAL CONDITIONS: Keppra 250 mg nightly   REVIEW OF SYSTEMS: Full 14 system review of systems performed and negative with exception of: Unable to fully obtain due to Dementia   ALLERGIES: No Known Allergies  HOME MEDICATIONS: Outpatient Medications Prior to Visit  Medication Sig Dispense Refill   levETIRAcetam (KEPPRA) 250 MG tablet Take 1 tablet (250 mg total) by mouth 2 (two) times daily. (Patient taking differently: Take 250 mg by mouth daily.) 60 tablet 2   nystatin cream (MYCOSTATIN) Apply topically 3 (three) times daily. (Patient not taking: Reported on 02/04/2023)     No facility-administered medications prior to visit.    PAST MEDICAL HISTORY: Past  Medical History:  Diagnosis Date   Arthritis    Bell's palsy    Borderline diabetes    Cancer (HCC)    breast   Dementia (HCC)    Diverticulosis    Hypercholesterolemia    Memory difficulties    Unsteady gait    Vertigo     PAST SURGICAL HISTORY: Past Surgical History:  Procedure Laterality Date   ABDOMINAL HYSTERECTOMY     BREAST SURGERY     COLONOSCOPY  10/29/2008   EAV:[id number] examination with some left-sided diverticula/Diminutive cecal polyp, status post cold biopsy removal.  Right colonic mucosa appeared normal. adenomatous   COLONOSCOPY N/A 01/17/2014   normal rectum, scattered left-sided diverticula, 2 diminutive polyps at cecum, tubular adenomas. Surveillance if health permits.    ESOPHAGEAL DILATION  10/16/2019   Procedure: ESOPHAGEAL DILATION;  Surgeon: Corbin Ade, MD;  Location: [facility];  Service: Endoscopy;;   ESOPHAGOGASTRODUODENOSCOPY (EGD) WITH PROPOFOL N/A 10/16/2019   Procedure: ESOPHAGOGASTRODUODENOSCOPY (EGD) WITH PROPOFOL;  Surgeon: Corbin Ade, MD;  Location: [facility];  Service: Endoscopy;  Laterality: N/A;  possible dilation   MASTECTOMY Left    abnormal mass in breast, non-cancerous per patient.    MASTECTOMY      FAMILY HISTORY: Family History  Problem Relation Age of Onset   Colon cancer Neg Hx     SOCIAL HISTORY: Social History   Socioeconomic History   Marital status: Married    Spouse name: Not on file   Number of children: Not on file  Years of education: Not on file   Highest education level: Not on file  Occupational History   Not on file  Tobacco Use   Smoking status: Never   Smokeless tobacco: Never  Substance and Sexual Activity   Alcohol use: No   Drug use: No   Sexual activity: Not on file  Other Topics Concern   Not on file  Social History Narrative   Not on file   Social Determinants of Health   Financial Resource Strain: Not on file  Food Insecurity: No Food Insecurity (02/03/2023)   Hunger  Vital Sign    Worried About Running Out of Food in the Last Year: Never true    Ran Out of Food in the Last Year: Never true  Transportation Needs: No Transportation Needs (02/03/2023)   PRAPARE - Administrator, Civil Service (Medical): No    Lack of Transportation (Non-Medical): No  Physical Activity: Not on file  Stress: Not on file  Social Connections: Unknown (11/08/2022)   [facility]   Social Connections    Frequency of Communication with Friends and Family: Not asked    Frequency of Social Gatherings with Friends and Family: Not asked  Intimate Partner Violence: Patient Unable To Answer (02/03/2023)   Humiliation, Afraid, Rape, and Kick questionnaire    Fear of Current or Ex-Partner: Patient unable to answer    Emotionally Abused: Patient unable to answer    Physically Abused: Patient unable to answer    Sexually Abused: Patient unable to answer    PHYSICAL EXAM  GENERAL EXAM/CONSTITUTIONAL: Vitals:  Vitals:   03/11/23 1300  BP: (!) 169/84  Pulse: 65  Resp: 16  Height: 5\' 1"  (1.549 m)   Body mass index is 21.2 kg/m. Wt Readings from Last 3 Encounters:  02/03/23 112 lb 3.4 oz (50.9 kg)  04/27/22 127 lb (57.6 kg)  10/16/19 147 lb 14.9 oz (67.1 kg)   Patient is in no distress; well developed, nourished and groomed; neck is supple  MUSCULOSKELETAL: Gait, strength, tone, movements noted in Neurologic exam below  NEUROLOGIC: MENTAL STATUS:      No data to display         awake, not response, does not follow commands but able to respond to name  Able to stand unassisted Able to track examiner  At least antigravity in all 4  extremities    DIAGNOSTIC DATA (LABS, IMAGING, TESTING) - I reviewed patient records, labs, notes, testing and imaging myself where available.  Lab Results  Component Value Date   WBC 7.3 02/04/2023   HGB 14.3 02/04/2023   HCT 43.4 02/04/2023   MCV 89.1 02/04/2023   PLT 178 02/04/2023      Component  Value Date/Time   NA 139 02/04/2023 0542   K 3.5 02/04/2023 0542   CL 107 02/04/2023 0542   CO2 27 02/04/2023 0542   GLUCOSE 78 02/04/2023 0542   BUN 7 (L) 02/04/2023 0542   CREATININE 0.69 02/04/2023 0542   CALCIUM 8.8 (L) 02/04/2023 0542   PROT 6.6 02/03/2023 1633   ALBUMIN 3.5 02/03/2023 1633   AST 20 02/03/2023 1633   ALT 14 02/03/2023 1633   ALKPHOS 54 02/03/2023 1633   BILITOT 1.0 02/03/2023 1633   GFRNONAA >60 02/04/2023 0542   GFRAA >60 10/17/2019 0550   No results found for: "CHOL", "HDL", "LDLCALC", "LDLDIRECT", "TRIG" Lab Results  Component Value Date   HGBA1C 5.2 02/04/2023   Lab Results  Component Value Date  ZOXWRUEA54 408 04/18/2008   Lab Results  Component Value Date   TSH 0.931 Test methodology is 3rd generation TSH 04/18/2008   CT head 02/04/2023 No acute intracranial process.   EEG 02/04/2023 Continuous slow, generalized   I personally reviewed brain Images and previous EEG reports.   ASSESSMENT AND PLAN  82 y.o. year old female  with severe dementia who is presenting for management of seizures, currently she is on Keppra 250 mg nightly, will switch it to liquid form since it would be easier for administration.  Advised him to continue following up with PCP and return as needed.   1. Seizures (HCC)   2. Severe dementia without behavioral disturbance, psychotic disturbance, mood disturbance, or anxiety, unspecified dementia type [facility])     Patient Instructions  Switch PO Keppra to Liquid Keppra, 250 mg daily  Continue your other medications  Return as needed    Per [facility] statutes, patients with seizures are not allowed to drive until they have been seizure-free for six months.  Other recommendations include using caution when using heavy equipment or power tools. Avoid working on ladders or at heights. Take showers instead of baths.  Do not swim alone.  Ensure the water temperature is not too high on the home water heater. Do not go  swimming alone. Do not lock yourself in a room alone (i.e. bathroom). When caring for infants or small children, sit down when holding, feeding, or changing them to minimize risk of injury to the child in the event you have a seizure. Maintain good sleep hygiene. Avoid alcohol.  Also recommend adequate sleep, hydration, good diet and minimize stress.   During the Seizure  - First, ensure adequate ventilation and place patients on the floor on their left side  Loosen clothing around the neck and ensure the airway is patent. If the patient is clenching the teeth, do not force the mouth open with any object as this can cause severe damage - Remove all items from the surrounding that can be hazardous. The patient may be oblivious to what's happening and may not even know what he or she is doing. If the patient is confused and wandering, either gently guide him/her away and block access to outside areas - Reassure the individual and be comforting - Call 911. In most cases, the seizure ends before EMS arrives. However, there are cases when seizures may last over 3 to 5 minutes. Or the individual may have developed breathing difficulties or severe injuries. If a pregnant patient or a person with diabetes develops a seizure, it is prudent to call an ambulance. - Finally, if the patient does not regain full consciousness, then call EMS. Most patients will remain confused for about 45 to 90 minutes after a seizure, so you must use judgment in calling for help. - Avoid restraints but make sure the patient is in a bed with padded side rails - Place the individual in a lateral position with the neck slightly flexed; this will help the saliva drain from the mouth and prevent the tongue from falling backward - Remove all nearby furniture and other hazards from the area - Provide verbal assurance as the individual is regaining consciousness - Provide the patient with privacy if possible - Call for help and start  treatment as ordered by the caregiver   After the Seizure (Postictal Stage)  After a seizure, most patients experience confusion, fatigue, muscle pain and/or a headache. Thus, one should permit the individual to  sleep. For the next few days, reassurance is essential. Being calm and helping reorient the person is also of importance.  Most seizures are painless and end spontaneously. Seizures are not harmful to others but can lead to complications such as stress on the lungs, brain and the heart. Individuals with prior lung problems may develop labored breathing and respiratory distress.    Discussed Patients with epilepsy have a small risk of sudden unexpected death, a condition referred to as sudden unexpected death in epilepsy (SUDEP). SUDEP is defined specifically as the sudden, unexpected, witnessed or unwitnessed, nontraumatic and nondrowning death in patients with epilepsy with or without evidence for a seizure, and excluding documented status epilepticus, in which post mortem examination does not reveal a structural or toxicologic cause for death     No orders of the defined types were placed in this encounter.   Meds ordered this encounter  Medications   levETIRAcetam (KEPPRA) 100 MG/ML solution    Sig: Take 2.5 mLs (250 mg total) by mouth daily.    Dispense:  473 mL    Refill:  12    Return if symptoms worsen or fail to improve.    Windell Norfolk, MD 03/11/2023, 1:43 PM  [facility] [address] [place], [place] [address] [phone]

## 2023-03-11 NOTE — Patient Instructions (Signed)
Switch PO Keppra to Liquid Keppra, 250 mg daily  Continue your other medications  Return as needed

## 2023-04-02 ENCOUNTER — Telehealth: Payer: Self-pay | Admitting: Neurology

## 2023-04-02 NOTE — Telephone Encounter (Signed)
Pt's daughter returning call to billing. Transferred

## 2023-05-19 ENCOUNTER — Ambulatory Visit: Payer: Medicare HMO | Admitting: Neurology
